# Patient Record
Sex: Male | Born: 1940 | Race: White | Hispanic: No | Marital: Married | Smoking: Former smoker
Health system: Southern US, Community
[De-identification: ages and names within clinical notes are randomized; demographics above are authoritative.]

## PROBLEM LIST (undated history)

## (undated) DIAGNOSIS — G8929 Other chronic pain: Secondary | ICD-10-CM

## (undated) DIAGNOSIS — M545 Low back pain, unspecified: Secondary | ICD-10-CM

## (undated) DIAGNOSIS — M1990 Unspecified osteoarthritis, unspecified site: Secondary | ICD-10-CM

## (undated) DIAGNOSIS — E785 Hyperlipidemia, unspecified: Secondary | ICD-10-CM

## (undated) DIAGNOSIS — IMO0001 Reserved for inherently not codable concepts without codable children: Secondary | ICD-10-CM

## (undated) DIAGNOSIS — I219 Acute myocardial infarction, unspecified: Secondary | ICD-10-CM

## (undated) DIAGNOSIS — I2510 Atherosclerotic heart disease of native coronary artery without angina pectoris: Secondary | ICD-10-CM

## (undated) DIAGNOSIS — D689 Coagulation defect, unspecified: Secondary | ICD-10-CM

## (undated) DIAGNOSIS — I739 Peripheral vascular disease, unspecified: Secondary | ICD-10-CM

## (undated) DIAGNOSIS — I10 Essential (primary) hypertension: Secondary | ICD-10-CM

## (undated) DIAGNOSIS — H269 Unspecified cataract: Secondary | ICD-10-CM

## (undated) DIAGNOSIS — D649 Anemia, unspecified: Secondary | ICD-10-CM

## (undated) HISTORY — DX: Anemia, unspecified: D64.9

## (undated) HISTORY — PX: TONSILLECTOMY: SUR1361

## (undated) HISTORY — PX: BACK SURGERY: SHX140

## (undated) HISTORY — DX: Coagulation defect, unspecified: D68.9

## (undated) HISTORY — PX: CATARACT EXTRACTION W/ INTRAOCULAR LENS  IMPLANT, BILATERAL: SHX1307

## (undated) HISTORY — DX: Atherosclerotic heart disease of native coronary artery without angina pectoris: I25.10

## (undated) HISTORY — DX: Essential (primary) hypertension: I10

## (undated) HISTORY — DX: Hyperlipidemia, unspecified: E78.5

## (undated) HISTORY — DX: Unspecified cataract: H26.9

## (undated) HISTORY — PX: CARDIAC CATHETERIZATION: SHX172

## (undated) HISTORY — PX: WRIST FUSION: SHX839

## (undated) HISTORY — PX: HAND SURGERY: SHX662

---

## 1989-06-22 HISTORY — PX: POSTERIOR LUMBAR FUSION: SHX6036

## 1989-06-22 HISTORY — PX: LUMBAR DISC SURGERY: SHX700

## 2002-09-23 ENCOUNTER — Encounter: Payer: Self-pay | Admitting: Neurosurgery

## 2002-09-25 ENCOUNTER — Inpatient Hospital Stay (HOSPITAL_COMMUNITY): Admission: RE | Admit: 2002-09-25 | Discharge: 2002-09-29 | Payer: Self-pay | Admitting: Neurosurgery

## 2002-09-25 ENCOUNTER — Encounter: Payer: Self-pay | Admitting: Neurosurgery

## 2002-09-26 ENCOUNTER — Encounter: Payer: Self-pay | Admitting: Neurosurgery

## 2003-03-19 ENCOUNTER — Ambulatory Visit (HOSPITAL_COMMUNITY): Admission: RE | Admit: 2003-03-19 | Discharge: 2003-03-19 | Payer: Self-pay | Admitting: Family Medicine

## 2005-04-20 ENCOUNTER — Encounter: Admission: RE | Admit: 2005-04-20 | Discharge: 2005-04-20 | Payer: Self-pay | Admitting: Orthopedic Surgery

## 2008-12-07 HISTORY — PX: CARDIOVASCULAR STRESS TEST: SHX262

## 2009-07-09 ENCOUNTER — Encounter: Admission: RE | Admit: 2009-07-09 | Discharge: 2009-07-09 | Payer: Self-pay | Admitting: Orthopedic Surgery

## 2009-07-19 ENCOUNTER — Encounter: Admission: RE | Admit: 2009-07-19 | Discharge: 2009-07-19 | Payer: Self-pay | Admitting: Orthopedic Surgery

## 2009-12-03 ENCOUNTER — Encounter: Admission: RE | Admit: 2009-12-03 | Discharge: 2009-12-03 | Payer: Self-pay | Admitting: Family Medicine

## 2010-10-25 ENCOUNTER — Ambulatory Visit: Payer: Self-pay | Admitting: Cardiovascular Disease

## 2011-03-09 NOTE — Discharge Summary (Signed)
   NAME:  Philip Hurst, Philip Hurst                             ACCOUNT NO.:  [id number]   MEDICAL RECORD NO.:  [id number]                   PATIENT TYPE:  INP   LOCATION:  3005                                 FACILITY:  [facility]   PHYSICIAN:  Danae Orleans. Venetia Maxon, M.D.               DATE OF BIRTH:  1940-11-27   DATE OF ADMISSION:  09/25/2002  DATE OF DISCHARGE:  09/29/2002                                 DISCHARGE SUMMARY   ADMISSION DIAGNOSES:  1. 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  2. Urinary tract infection.  3. History of illness.   HOSPITAL COURSE:  The patient is a 71 year old man with recurrent lumbar  disc herniation at L3-L4 on the left with spondylosis, degenerative disc  disease and lumbar radiculopathy.  He had been previously operated on by Dr.  Renae Fickle.  He was admitted on the same day of procedure basis and underwent L3-  L4 redo laminectomy with transverse lumbar interbody fusion at the L3-L4  level with pedicle screw fixation and posterolateral arthrodesis.  Postoperatively, he did well and was gradually mobilized.  On December 9, he  was discharged to home.   FOLLOW UP:  Follow up in three to four weeks.   DISCHARGE MEDICATIONS:  1. Percocet.  2. Valium.  3. Ciprofloxacin for urinary tract infection.   CONDITION ON DISCHARGE:  He was doing well on discharge.                                               Danae Orleans. Venetia Maxon, M.D.    JDS/MEDQ  D:  11/04/2002  T:  11/05/2002  Job:  [id number]

## 2011-03-09 NOTE — Op Note (Signed)
NAME:  Philip Hurst, Philip Hurst                             ACCOUNT NO.:  [id number]   MEDICAL RECORD NO.:  [id number]                   PATIENT TYPE:  INP   LOCATION:  3172                                 FACILITY:  [facility]   PHYSICIAN:  Danae Orleans. Venetia Maxon, M.D.               DATE OF BIRTH:  1941-08-20   DATE OF PROCEDURE:  09/25/2002  DATE OF DISCHARGE:                                 OPERATIVE REPORT   PREOPERATIVE DIAGNOSES:  Recurrent herniated lumbar disk, L3-4 left with  spondylosis, degenerative disk disease and lumbar radiculopathy.   POSTOPERATIVE DIAGNOSIS:  Recurrent herniated lumbar disk, L3-4 left with  spondylosis, degenerative disk disease and lumbar radiculopathy.   PROCEDURE:  L3-4 redo laminectomy with transverse lumbar interbody fusion (9  mm Synthes bone graft) with pedicle screw fixation L3 through L4 bilaterally  with posterior lateral arthrodesis L3-4 using morselized autograft and  Vitoss.   SURGEON:  Danae Orleans. Venetia Maxon, M.D.   ASSISTANT:  Cristi Loron, M.D.   ANESTHESIA:  General endotracheal anesthesia.   ESTIMATED BLOOD LOSS:  400 cc.   COMPLICATIONS:  None.   DISPOSITION:  To recovery.   INDICATIONS FOR PROCEDURE:  The patient is a 70 year old man who had  previously undergone lumbar diskectomy many years ago.  He has had a long  history of severe left leg pain and weakness.  He was found to have  spondylosis and scoliosis with recurrent disk herniation causing  extraforaminal and foraminal compression of the L3 nerve root.  It was  elected to take him to surgery for decompression and fusion.   PROCEDURE IN DETAIL:  The patient was brought to the operating room.  Following satisfactory level of uncomplicated induction of general  endotracheal anesthesia, placement of intravenous lines and Foley catheter,  he was turned in the prone position on chest rolls.  His low back was then  prepped and draped in the usual sterile fashion.  The plane for incision  was  infiltrated with 0.25% Marcaine and 1% lidocaine, 1:200,000 epinephrine.  Incision was made in the midline over the previous lumbar incision.  It was  carried above and below  to facilitate exposure.  The subperiosteal  dissection was then performed exposing the L3 through L4 transverse  processes bilaterally.  Intraoperative x-ray confirmed our marker at the L3-  4 interspace.  The  Versatrac retractor was used to facilitate exposure.  There was a considerable amount of scar tissue at the L3-4 interspace.  This  was taken down with a guided curet and then pars interarticularis was  drilled out and the remaining facet and laminar bone were removed using a  variety of Kerrison's and Leksell rongeurs.  There was a marked amount of  scar tissue compressing the lateral aspect of the fecal sac and also the L3  nerve.  These were decompressed carefully under loop magnification.  A  diskectomy was  performed at this level with resultant decompression of both  the L3 nerve and the lateral sac but it was not possible to fully mobilize  the lateral sac on the left because of the considerable amount of scar  tissue.  It was, therefore, elected to perform laminectomy on the right as  well, and this was done by drilling the pars interarticularis and the lamina  and removing the inferior facet of L3 in one piece.  Subsequently, lateral  recess and foraminal region were decompressed.  The L4 and L3 nerve roots  were decompressed widely.  Subsequently, a diskectomy was performed on the  right side after protecting the chondral tube with the D'Errico nerve root  retractor.  Significant osteophytes were removed with the oscillating tool.  After diskectomy had been performed bilaterally and the osteophytes had been  removed, it was elected to place pedicle screw fixation and this was done at  both the L3 and L4 levels bilaterally -  6.75 x 40 mm screws were placed,  two at L3 and two at L4.  All screws  had excellent purchase. Their position  was confirmed under lateral and AP fluoroscopy.  Subsequently, the  interspace was distracted from the left side initially with a rod, but then  subsequently with an interspace distractor and the endplates of L3 and L4  were stripped of residual cartilaginous material on the right and residual  diskectomy was performed.  Using the Synthes TLIF instruments, the endplates  were stripped of residual disk material, and subsequently 9 mm Dynamic size  was found to fit appropriately in the interspace and a 9 mm bone graft was  inserted and counter sunk appropriately.  Position was confirmed under  fluoroscopy and subsequently morselized bone autograft was then inserted in  the interspace and count sunk appropriately.  The transverse processes of L3  and L4 were then stripped of periosteum and morselized bone autograft and  mixed with Vitoss was then laid over the transverse processes from L3 to L4  bilaterally.  40 mm rods were then inserted in the SD-90 pedicles screws and  locking caps were engaged.  The wound had been copiously irrigated with  prior placement bone graft and subsequently the self-retaining retractor was  removed.  The nerve roots were inspected and found to be in good repair as  was the thecal sac. The lumbodorsal fascia was then 0 Vicryl sutures.  Subcutaneous tissues were reapproximated with 2-0 Vicryl interrupted  inverted sutures, and skin edges were reapproximated 3-0 Vicryl subcuticular  stitch.  The wound was dressed with Benzoin and Steri-Strips, Telfa gauze  and tape.  The patient was extubated in the operating room and taken to the  recovery room in stable and satisfactory condition, having  tolerated  the  operative procedure well.  All counts were correct at the end of the case.                                               Danae Orleans. Venetia Maxon, M.D.    JDS/MEDQ  D:  09/25/2002  T:  09/26/2002  Job:  [id number]

## 2011-08-27 ENCOUNTER — Ambulatory Visit: Payer: Self-pay | Admitting: Cardiovascular Disease

## 2011-08-27 ENCOUNTER — Encounter: Payer: Self-pay | Admitting: Cardiovascular Disease

## 2011-08-28 ENCOUNTER — Other Ambulatory Visit: Payer: Self-pay | Admitting: *Deleted

## 2011-08-28 MED ORDER — CARVEDILOL 6.25 MG PO TABS: 6.2500 mg | ORAL_TABLET | Freq: Two times a day (BID) | ORAL | Status: DC

## 2011-09-12 ENCOUNTER — Ambulatory Visit (INDEPENDENT_AMBULATORY_CARE_PROVIDER_SITE_OTHER): Payer: Medicare Other | Admitting: Cardiovascular Disease

## 2011-09-12 ENCOUNTER — Encounter: Payer: Self-pay | Admitting: Cardiovascular Disease

## 2011-09-12 VITALS — BP 169/91 | HR 70 | Ht 64.0 in | Wt 179.1 lb

## 2011-09-12 DIAGNOSIS — I10 Essential (primary) hypertension: Secondary | ICD-10-CM

## 2011-09-12 DIAGNOSIS — I2510 Atherosclerotic heart disease of native coronary artery without angina pectoris: Secondary | ICD-10-CM | POA: Insufficient documentation

## 2011-09-12 DIAGNOSIS — E785 Hyperlipidemia, unspecified: Secondary | ICD-10-CM

## 2011-09-12 HISTORY — DX: Atherosclerotic heart disease of native coronary artery without angina pectoris: I25.10

## 2011-09-12 NOTE — Patient Instructions (Signed)
Your physician wants you to follow-up in: 6 mo You will receive a reminder letter in the mail two months in advance. If you don't receive a letter, please call our office to schedule the follow-up appointment.  Your physician recommends that you return for a FASTING lipid profile: 6months

## 2011-09-12 NOTE — Progress Notes (Signed)
Patient ID: Philip Hurst, male   DOB: [birth year], 70 y.o.   MRN: [id number]     Philip Hurst Date of Birth  1941-08-02 [facility] [address]    [address] [place], [place]  [address] [phone]  Fax  [phone]  History of Present Illness:  Philip Hurst is a 70 year old gentleman with a history of coronary artery disease. He has an occluded right coronary artery. He also has a history of hypertension and hyperlipidemia.  He's had a cold recently and states it is been eating some chicken noodle soup. He does state that he's been getting a little it of extra salt because of his illness.  He's been able to do all of his normal activities without any problems.  Current Outpatient Prescriptions on File Prior to Visit  Medication Sig Dispense Refill  . aspirin 81 MG tablet Take 81 mg by mouth daily.        . carvedilol (COREG) 6.25 MG tablet Take 1 tablet (6.25 mg total) by mouth 2 (two) times daily with a meal.  60 tablet  3  . fish oil-omega-3 fatty acids 1000 MG capsule Take 2 g by mouth daily.        Marland Kitchen gemfibrozil (LOPID) 600 MG tablet Take 600 mg by mouth 2 (two) times daily before a meal.        . lisinopril (PRINIVIL,ZESTRIL) 20 MG tablet Take 20 mg by mouth daily.        Marland Kitchen lovastatin (MEVACOR) 40 MG tablet Take 40 mg by mouth at bedtime.          No Known Allergies  Past Medical History  Diagnosis Date  . Hypertension   . Hyperlipidemia   . Coronary artery disease     OCCLUDED RCA    Past Surgical History  Procedure Date  . Cardiovascular stress test 12/07/2008    EF 58%, NO ISCHEMIA    History  Smoking status  . Former Smoker  . Quit date: 08/26/1997  Smokeless tobacco  . Not on file    History  Alcohol Use No    Family History  Problem Relation Age of Onset  . Seizures Mother   . Cancer Father   . Coronary artery disease Brother   . Diabetes Brother     Reviw of Systems:  Reviewed in the HPI.  All other systems are negative.  Physical  Exam: BP 169/91  Pulse 70  Ht 5\' 4"  (1.626 m)  Wt 179 lb 1.9 oz (81.248 kg)  BMI 30.75 kg/m2 The patient is alert and oriented x 3.  The mood and affect are normal.   Skin: warm and dry.  Color is normal.    HEENT:   [name], AT, normal carotids, no JVD, neck is supple  Lungs: clear   Heart: RR, no murmurs    Abdomen: soft, +BS  Extremities:  No c/c/e  Neuro:  Gait is normal    ECG: EKG reveals normal sinus rhythm. He has a first degree AV block.  Assessment / Plan:

## 2011-09-12 NOTE — Assessment & Plan Note (Signed)
His blood pressure is a little high today. I think that he's been eating some extra salt because of his viral illness. He'll get back to his normal less than. I've asked him to check his blood pressure on a regular basis. I'll see him again in 3 months.

## 2011-09-12 NOTE — Assessment & Plan Note (Signed)
We'll check a fasting lipid profile and hepatic profile and basic profile at his next office visit.

## 2011-09-12 NOTE — Assessment & Plan Note (Signed)
This is stable. He's not having episodes of angina.

## 2012-03-21 ENCOUNTER — Other Ambulatory Visit: Payer: Self-pay | Admitting: *Deleted

## 2012-03-21 MED ORDER — CARVEDILOL 6.25 MG PO TABS: 6.2500 mg | ORAL_TABLET | Freq: Two times a day (BID) | ORAL | Status: DC

## 2012-03-21 NOTE — Telephone Encounter (Signed)
Fax Received. Refill Completed. Anahit Klumb Chowoe (R.M.A)   

## 2012-08-26 ENCOUNTER — Telehealth: Payer: Self-pay | Admitting: Oncology

## 2012-08-26 NOTE — Telephone Encounter (Signed)
C/D 08/26/12 for appt. 09/05/12

## 2012-08-29 ENCOUNTER — Other Ambulatory Visit: Payer: Self-pay | Admitting: Oncology

## 2012-08-29 DIAGNOSIS — D649 Anemia, unspecified: Secondary | ICD-10-CM

## 2012-09-05 ENCOUNTER — Ambulatory Visit (HOSPITAL_BASED_OUTPATIENT_CLINIC_OR_DEPARTMENT_OTHER): Payer: Medicare Other | Admitting: Oncology

## 2012-09-05 ENCOUNTER — Ambulatory Visit: Payer: Medicare Other

## 2012-09-05 ENCOUNTER — Telehealth: Payer: Self-pay | Admitting: Oncology

## 2012-09-05 ENCOUNTER — Encounter: Payer: Self-pay | Admitting: Oncology

## 2012-09-05 ENCOUNTER — Other Ambulatory Visit (HOSPITAL_BASED_OUTPATIENT_CLINIC_OR_DEPARTMENT_OTHER): Payer: Medicare Other | Admitting: Lab

## 2012-09-05 VITALS — BP 120/69 | HR 79 | Temp 97.7°F | Resp 20 | Ht 64.0 in | Wt 187.1 lb

## 2012-09-05 DIAGNOSIS — D539 Nutritional anemia, unspecified: Secondary | ICD-10-CM

## 2012-09-05 DIAGNOSIS — D649 Anemia, unspecified: Secondary | ICD-10-CM

## 2012-09-05 LAB — CBC WITH DIFFERENTIAL/PLATELET
Basophils Absolute: 0 10*3/uL (ref 0.0–0.1)
EOS%: 2.5 % (ref 0.0–7.0)
HCT: 29.8 % — ABNORMAL LOW (ref 38.4–49.9)
HGB: 10.4 g/dL — ABNORMAL LOW (ref 13.0–17.1)
LYMPH%: 23.7 % (ref 14.0–49.0)
MCH: 32 pg (ref 27.2–33.4)
MCV: 91.5 fL (ref 79.3–98.0)
MONO%: 9.2 % (ref 0.0–14.0)
NEUT%: 64.1 % (ref 39.0–75.0)
Platelets: 277 10*3/uL (ref 140–400)
lymph#: 2.2 10*3/uL (ref 0.9–3.3)

## 2012-09-05 LAB — COMPREHENSIVE METABOLIC PANEL (CC13)
AST: 20 U/L (ref 5–34)
BUN: 36 mg/dL — ABNORMAL HIGH (ref 7.0–26.0)
Calcium: 9.9 mg/dL (ref 8.4–10.4)
Chloride: 105 mEq/L (ref 98–107)
Creatinine: 1.4 mg/dL — ABNORMAL HIGH (ref 0.7–1.3)
Total Bilirubin: 0.26 mg/dL (ref 0.20–1.20)

## 2012-09-05 NOTE — Telephone Encounter (Signed)
Gave pt appt for 11/05/12.

## 2012-09-05 NOTE — Progress Notes (Signed)
Note dictated

## 2012-09-05 NOTE — Progress Notes (Signed)
Checked in new patient. No financial issues. °

## 2012-09-05 NOTE — Progress Notes (Signed)
CC:   Windle Guard, M.D.  REASON FOR CONSULTATION:  Anemia.  HISTORY OF PRESENT ILLNESS:  This is a 71 year old gentleman originally from [place], [place], currently of [place] since 1980.  This is a gentleman who is relatively healthy.  He is retired, worked in [employer] and also served in the [employer] during the [place] war.  This is a gentleman with history of hypertension and hyperlipidemia and gets his routine care as mentioned with Dr. Windle Guard.  He was diagnosed with anemia about 2 years ago and at that time he was treated with iron supplementation and his anemia resolved at that time.  However, most recently his hemoglobin drifted back again to 11.0 on 05/30/2012.  Subsequent to that on 09/18 was up to 12.2 and now drifted back again to 11.2.  His iron studies showed an iron level of 99, saturation of 21 percent.  He is relatively asymptomatic.  He does report some mild fatigue but had not reported a weight loss, had not had any major appetite changes.  He has not reported any major decline in his performance status.  He does report some hip pain, which hindered his regular exercise of walking but that is arthritic and has been in nature.  Otherwise he does not report any constitutional symptoms.  He has not reported any major changes in his appetite or performance status.  He had not had any weight loss or decline in his energy.  REVIEW OF SYSTEMS:  He does not report any headaches, blurry vision, double vision.  Does not report any motor or sensory neuropathy.  Does not report any alteration in mental status.  Does not reporting any psychiatric issues or depression.  Does not report any fever, chills, sweats.  Does not report any cough, hemoptysis, hematemesis.  No nausea, vomiting, abdominal pain, hematochezia, melena, genitourinary complaints.  The rest of the review of systems unremarkable.  PAST MEDICAL HISTORY:  Significant for hypertension, history  of hyperlipidemia, history of osteoarthritis.  No diabetes or coronary artery disease.  ALLERGIES:  None.  MEDICATIONS:  He is on aspirin, carvedilol, fish oil, gemfibrozil, lisinopril, lovastatin, oxycodone and testosterone supplements.  SOCIAL HISTORY:  He is married.  He has 4 children.  Quit smoking about 20 years ago, after he had smoked for about 30 years.  Denied any alcohol abuse.  FAMILY HISTORY:  His father died of alcoholic cirrhosis.  Unsure about his mother's health but deceased possibly from a seizure.  He had a brother and a sister who died of lung cancer.  PHYSICAL EXAMINATION:  General:  Alert, awake gentleman, appeared in no active distress today.  Vital signs:  His vitals showed a blood pressure 120/69, pulse 79, respiration 20, weighs 187 pounds.  HEENT:  Head is normocephalic, atraumatic.  Pupils equal, round, reactive to light. Oral mucosa moist and pink.  Neck:  Supple without adenopathy.  Heart: Regular rate and rhythm.  S1, S2.  Lungs:  Clear to auscultation without wheezes, dullness to percussion.  Abdomen:  Soft, nontender.  No hepatosplenomegaly.  Extremities:  No clubbing, cyanosis or edema. Neurological:  Intact motor, sensory and deep tendon reflexes.  LABORATORY DATA:  Today showed a hemoglobin of 10.4, white cell count of 9.2, platelet count of 277.  His MCV is 91.5.  Peripheral smear was personally reviewed today and I could not really appreciate any schistocytosis or red cell fragmentation.  I could not really appreciate any dysplasia, could not appreciate any white cell abnormalities. Platelets appeared normal.  The red cells appeared normal.  There was some population of spherocytes noted.  ASSESSMENT AND PLAN:  A 71 year old gentleman with normocytic normochromic anemia that has fluctuated for the last 2 years, more specifically in the last couple of years at this time.  The differential diagnoses discussed with Mr. Goldring today, these would  include vitamin deficiency such as vitamin B12, folic acid, possibly iron deficiency which I think is less likely, could be anemia of chronic disease due to his multiple medications, could be due to his chronic medical issues. This could also be a plasma cell disorder, myelodysplastic syndrome are also distinct possibility.  It could also be a mildly compensated hemolysis although I think it is less likely.  His electrolytes including liver function tests and bilirubin have been normal but those have been repeated.  For the time being he is asymptomatic, it is very mild at this time.  I will suggest a close period of observation until his workup is complete and I have discussed with him the possibility of a bone marrow biopsy if we cannot determine the etiology of his anemia. I also talked to him about possibly using growth factor support if needed to down the line.  All his questions were answered today.    ______________________________ Benjiman Core, M.D. FNS/MEDQ  D:  09/05/2012  T:  09/05/2012  Job:  [id number]

## 2012-09-12 LAB — SPEP & IFE WITH QIG
Alpha-1-Globulin: 4.9 % (ref 2.9–4.9)
Alpha-2-Globulin: 16.5 % — ABNORMAL HIGH (ref 7.1–11.8)
Gamma Globulin: 11.2 % (ref 11.1–18.8)
Immunofix Electr Int: 0
Total Protein, Serum Electrophoresis: 7.3 g/dL (ref 6.0–8.3)

## 2012-09-12 LAB — VITAMIN B12 DEFICIENCY PANEL - CHCC

## 2012-09-12 LAB — ERYTHROPOIETIN: Erythropoietin: 24.8 m[IU]/mL (ref 2.6–34.0)

## 2012-09-12 LAB — IRON AND TIBC
Iron: 95 ug/dL (ref 42–165)
TIBC: 341 ug/dL (ref 215–435)
UIBC: 246 ug/dL (ref 125–400)

## 2012-11-05 ENCOUNTER — Other Ambulatory Visit (HOSPITAL_BASED_OUTPATIENT_CLINIC_OR_DEPARTMENT_OTHER): Payer: Medicare Other | Admitting: Lab

## 2012-11-05 ENCOUNTER — Telehealth: Payer: Self-pay | Admitting: Oncology

## 2012-11-05 ENCOUNTER — Ambulatory Visit (HOSPITAL_BASED_OUTPATIENT_CLINIC_OR_DEPARTMENT_OTHER): Payer: Medicare Other | Admitting: Oncology

## 2012-11-05 VITALS — BP 138/84 | HR 90 | Temp 97.3°F | Resp 20 | Ht 64.0 in | Wt 193.7 lb

## 2012-11-05 DIAGNOSIS — D649 Anemia, unspecified: Secondary | ICD-10-CM

## 2012-11-05 DIAGNOSIS — D539 Nutritional anemia, unspecified: Secondary | ICD-10-CM

## 2012-11-05 LAB — CBC WITH DIFFERENTIAL/PLATELET
Eosinophils Absolute: 0.3 10*3/uL (ref 0.0–0.5)
MONO#: 0.7 10*3/uL (ref 0.1–0.9)
NEUT#: 4.9 10*3/uL (ref 1.5–6.5)
RBC: 3.77 10*6/uL — ABNORMAL LOW (ref 4.20–5.82)
RDW: 13.4 % (ref 11.0–14.6)
WBC: 8.3 10*3/uL (ref 4.0–10.3)

## 2012-11-05 LAB — COMPREHENSIVE METABOLIC PANEL (CC13)
Albumin: 4.2 g/dL (ref 3.5–5.0)
Alkaline Phosphatase: 82 U/L (ref 40–150)
CO2: 26 mEq/L (ref 22–29)
Glucose: 95 mg/dl (ref 70–99)
Potassium: 4.6 mEq/L (ref 3.5–5.1)
Sodium: 139 mEq/L (ref 136–145)
Total Protein: 7.8 g/dL (ref 6.4–8.3)

## 2012-11-05 NOTE — Telephone Encounter (Signed)
Gave pt appt for lab and MD on July 2014 °

## 2012-11-05 NOTE — Progress Notes (Signed)
Hematology and Oncology Follow Up Visit  Philip Hurst [id number] 05/29/1941 72 y.o. 11/05/2012 3:29 PM   Principle Diagnosis: 72 year old with mild anemia. Likely multifactorial due chronic disease or renal disease diagnosed in 08/2012    Current therapy: Observation only.   Interim History:  [name] presents today for a follow up visit, he a very nice man returns today for a check up. He has been doing well since the last visit. He does report some hip pain, which hindered his regular exercise of walking but that is arthritic and has been in nature. Otherwise he does not report any constitutional symptoms. He  has not reported any major changes in his appetite or performance status. He had not had any weight loss or decline in his energy.   Medications: I have reviewed the patient's current medications. Current outpatient prescriptions:aspirin 81 MG tablet, Take 81 mg by mouth daily.  , Disp: , Rfl: ;  fish oil-omega-3 fatty acids 1000 MG capsule, Take 2 g by mouth daily.  , Disp: , Rfl: ;  hydrochlorothiazide (HYDRODIURIL) 25 MG tablet, Take 25 mg by mouth daily., Disp: , Rfl: ;  HYDROcodone-acetaminophen (NORCO) 10-325 MG per tablet, Take 1 tablet by mouth every 6 (six) hours as needed., Disp: , Rfl:  losartan (COZAAR) 100 MG tablet, Take 100 mg by mouth daily., Disp: , Rfl: ;  lovastatin (MEVACOR) 40 MG tablet, Take 40 mg by mouth at bedtime.  , Disp: , Rfl: ;  testosterone cypionate (DEPOTESTOTERONE CYPIONATE) 200 MG/ML injection, as needed., Disp: , Rfl:   Allergies: No Known Allergies  Past Medical History, Surgical history, Social history, and Family History were reviewed and updated.  Review of Systems: Constitutional:  Negative for fever, chills, night sweats, anorexia, weight loss, pain. Cardiovascular: no chest pain or dyspnea on exertion Respiratory: negative Neurological: negative Dermatological: negative ENT: negative Skin: Negative. Gastrointestinal:  negative Genito-Urinary: negative Hematological and Lymphatic: negative Breast: negative Musculoskeletal: negative Remaining ROS negative. Physical Exam: Blood pressure 138/84, pulse 90, temperature 97.3 F (36.3 C), temperature source Oral, resp. rate 20, height 5\' 4"  (1.626 m), weight 193 lb 11.2 oz (87.862 kg). ECOG: 0 General appearance: alert Head: Normocephalic, without obvious abnormality, atraumatic Neck: no adenopathy, no carotid bruit, no JVD, supple, symmetrical, trachea midline and thyroid not enlarged, symmetric, no tenderness/mass/nodules Lymph nodes: Cervical, supraclavicular, and axillary nodes normal. Heart:regular rate and rhythm, S1, S2 normal, no murmur, click, rub or gallop Lung:chest clear, no wheezing, rales, normal symmetric air entry Abdomin: soft, non-tender, without masses or organomegaly EXT:no erythema, induration, or nodules   Lab Results: Lab Results  Component Value Date   WBC 8.3 11/05/2012   HGB 11.9* 11/05/2012   HCT 34.6* 11/05/2012   MCV 91.8 11/05/2012   PLT 239 11/05/2012     Chemistry      Component Value Date/Time   NA 138 09/05/2012 1351   K 5.3* 09/05/2012 1351   CL 105 09/05/2012 1351   CO2 24 09/05/2012 1351   BUN 36.0* 09/05/2012 1351   CREATININE 1.4* 09/05/2012 1351      Component Value Date/Time   CALCIUM 9.9 09/05/2012 1351   ALKPHOS 85 09/05/2012 1351   AST 20 09/05/2012 1351   ALT 21 09/05/2012 1351   BILITOT 0.26 09/05/2012 1351       Impression and Plan:  71 year old gentleman with normocytic normochromic anemia that has fluctuated for the last 2 years. It appears to be multifactorial in nature.  His work up did not  revel any plasma cell disorder.  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  His counts today appear better with Hgb up to 11.9.  For now, we will continue observation and repeat counts in 6 months. If any further drop noted, we will consider a bone marrow  biopsy.   [facility], MD 1/15/20143:29 PM

## 2012-11-11 ENCOUNTER — Other Ambulatory Visit: Payer: Self-pay | Admitting: Family Medicine

## 2012-11-11 DIAGNOSIS — R252 Cramp and spasm: Secondary | ICD-10-CM

## 2012-11-13 ENCOUNTER — Other Ambulatory Visit: Payer: Medicare Other

## 2012-11-13 ENCOUNTER — Ambulatory Visit
Admission: RE | Admit: 2012-11-13 | Discharge: 2012-11-13 | Disposition: A | Discharge status: Home / Self Care | Payer: Medicare Other | Source: Ambulatory Visit | Attending: Family Medicine | Admitting: Family Medicine

## 2012-11-13 ENCOUNTER — Other Ambulatory Visit: Payer: Self-pay | Admitting: Family Medicine

## 2012-11-13 DIAGNOSIS — R252 Cramp and spasm: Secondary | ICD-10-CM

## 2012-11-19 ENCOUNTER — Other Ambulatory Visit (HOSPITAL_COMMUNITY): Payer: Self-pay | Admitting: Cardiovascular Disease

## 2012-11-19 DIAGNOSIS — Z01818 Encounter for other preprocedural examination: Secondary | ICD-10-CM

## 2012-11-25 ENCOUNTER — Ambulatory Visit (HOSPITAL_COMMUNITY)
Admission: RE | Admit: 2012-11-25 | Discharge: 2012-11-25 | Disposition: A | Discharge status: Home / Self Care | Payer: Medicare Other | Source: Ambulatory Visit | Attending: Cardiovascular Disease | Admitting: Cardiovascular Disease

## 2012-11-25 DIAGNOSIS — Z01818 Encounter for other preprocedural examination: Secondary | ICD-10-CM

## 2012-11-25 DIAGNOSIS — I2510 Atherosclerotic heart disease of native coronary artery without angina pectoris: Secondary | ICD-10-CM | POA: Insufficient documentation

## 2012-11-25 DIAGNOSIS — I10 Essential (primary) hypertension: Secondary | ICD-10-CM | POA: Insufficient documentation

## 2012-11-25 DIAGNOSIS — I739 Peripheral vascular disease, unspecified: Secondary | ICD-10-CM | POA: Insufficient documentation

## 2012-11-25 MED ORDER — REGADENOSON 0.4 MG/5ML IV SOLN
0.4000 mg | Freq: Once | INTRAVENOUS | Status: AC
  Administered 2012-11-25: 0.4 mg via INTRAVENOUS

## 2012-11-25 MED ORDER — TECHNETIUM TC 99M SESTAMIBI GENERIC - CARDIOLITE
31.0000 | Freq: Once | INTRAVENOUS | Status: AC | PRN
  Administered 2012-11-25: 31 via INTRAVENOUS

## 2012-11-25 MED ORDER — TECHNETIUM TC 99M SESTAMIBI GENERIC - CARDIOLITE
11.0000 | Freq: Once | INTRAVENOUS | Status: AC | PRN
  Administered 2012-11-25: 11 via INTRAVENOUS

## 2012-11-25 NOTE — Procedures (Addendum)
[facility] [address] [place] [address] [phone]  Cardiology Nuclear Med Study  KNOWLEDGE ESCANDON is a 72 y.o. male     MRN : [id number]     DOB: 1941-04-18  Procedure Date: 11/25/2012  Nuclear Med Background Indication for Stress Test:  Surgical Clearance History:  CAD Cardiac Risk Factors: History of Smoking, Hypertension, Lipids, Overweight and PVD  Symptoms:  none   Nuclear Pre-Procedure Caffeine/Decaff Intake:  7:00pm NPO After: 5:00am   IV Site: R Hand  IV 0.9% NS with Angio Cath:  22g  Chest Size (in):  42 IV Started by: Koren Shiver, CNMT  Height: 5\' 4"  (1.626 m)  Cup Size: n/a  BMI:  Body mass index is 33.30 kg/(m^2). Weight:  194 lb (87.998 kg)   Tech Comments:  n/a    Nuclear Med Study 1 or 2 day study: 1 day  Stress Test Type:  Lexiscan  Order Authorizing Provider:  Nanetta Batty, MD   Resting Radionuclide: Technetium 39m Sestamibi  Resting Radionuclide Dose: 11.0 mCi   Stress Radionuclide:  Technetium 2m Sestamibi  Stress Radionuclide Dose: 31.0 mCi           Stress Protocol Rest HR: 88 Stress HR: 102  Rest BP: 178/86 Stress BP: 154/80  Exercise Time (min): n/a METS: n/a          Dose of Adenosine (mg):  n/a Dose of Lexiscan: 0.4 mg  Dose of Atropine (mg): n/a Dose of Dobutamine: n/a mcg/kg/min (at max HR)  Stress Test Technologist: Ernestene Mention, CCT Nuclear Technologist: Gonzella Lex, CNMT   Rest Procedure:  Myocardial perfusion imaging was performed at rest 45 minutes following the intravenous administration of Technetium 49m Sestamibi. Stress Procedure:  The patient received IV Lexiscan 0.4 mg over 15-seconds.  Technetium 43m Sestamibi injected at 30-seconds.  There were no significant changes with Lexiscan.  Quantitative spect images were obtained after a 45 minute delay.  Transient Ischemic Dilatation (Normal <1.22):  0.80  Lung/Heart Ratio (Normal <0.45):  0.38 QGS EDV:  65  ml QGS ESV:  22 ml LV Ejection Fraction: 66%  Signed by    Rest ECG: NSR with 1st degree AVB  Stress ECG: There are scattered PVCs.  QPS Raw Data Images:  Normal; no motion artifact; normal heart/lung ratio. Stress Images:  Small fixed basal inferior bowel artifact. Rest Images:  Small fixed basal inferior bowel artifact. Subtraction (SDS):  No evidence of ischemia.  Impression Exercise Capacity:  Lexiscan with no exercise. BP Response:  Normal blood pressure response. Clinical Symptoms:  No significant symptoms noted. ECG Impression:  There are scattered PVCs. Comparison with Prior Nuclear Study: No previous nuclear study performed  Overall Impression:  Low risk stress nuclear study. Small basal inferior bowel attenuation artifact. No ischemia.  LV Wall Motion:  NL LV Function; NL Wall Motion; EF 66%. TID 0.8 (normal)  Chrystie Nose, MD, [facility] Attending Cardiologist [facility]  Chrystie Nose, MD  11/25/2012 11:17 AM

## 2013-01-06 ENCOUNTER — Other Ambulatory Visit (HOSPITAL_COMMUNITY): Payer: Self-pay | Admitting: Cardiovascular Disease

## 2013-01-06 DIAGNOSIS — I70219 Atherosclerosis of native arteries of extremities with intermittent claudication, unspecified extremity: Secondary | ICD-10-CM

## 2013-01-19 ENCOUNTER — Ambulatory Visit (HOSPITAL_COMMUNITY)
Admission: RE | Admit: 2013-01-19 | Discharge: 2013-01-19 | Disposition: A | Discharge status: Home / Self Care | Payer: Medicare Other | Source: Ambulatory Visit | Attending: Cardiovascular Disease | Admitting: Cardiovascular Disease

## 2013-01-19 DIAGNOSIS — I70219 Atherosclerosis of native arteries of extremities with intermittent claudication, unspecified extremity: Secondary | ICD-10-CM

## 2013-01-19 NOTE — Progress Notes (Signed)
ABIs and lower extremity duplex doppler was completed. Kayleen Alig RVT 

## 2013-01-23 ENCOUNTER — Other Ambulatory Visit: Payer: Self-pay | Admitting: *Deleted

## 2013-01-23 DIAGNOSIS — Z01810 Encounter for preprocedural cardiovascular examination: Secondary | ICD-10-CM

## 2013-01-26 ENCOUNTER — Encounter (HOSPITAL_COMMUNITY): Payer: Self-pay | Admitting: Pharmacy Technician

## 2013-01-27 ENCOUNTER — Ambulatory Visit
Admission: RE | Admit: 2013-01-27 | Discharge: 2013-01-27 | Disposition: A | Discharge status: Home / Self Care | Payer: Medicare Other | Source: Ambulatory Visit | Attending: Cardiovascular Disease | Admitting: Cardiovascular Disease

## 2013-01-27 ENCOUNTER — Other Ambulatory Visit: Payer: Self-pay | Admitting: Cardiovascular Disease

## 2013-01-27 DIAGNOSIS — Z01811 Encounter for preprocedural respiratory examination: Secondary | ICD-10-CM

## 2013-01-27 DIAGNOSIS — Z87891 Personal history of nicotine dependence: Secondary | ICD-10-CM

## 2013-01-30 ENCOUNTER — Ambulatory Visit (HOSPITAL_COMMUNITY)
Admission: RE | Admit: 2013-01-30 | Discharge: 2013-01-31 | Disposition: A | Discharge status: Home / Self Care | Payer: Medicare Other | Source: Ambulatory Visit | Attending: Cardiovascular Disease | Admitting: Cardiovascular Disease

## 2013-01-30 ENCOUNTER — Other Ambulatory Visit: Payer: Self-pay | Admitting: Cardiology

## 2013-01-30 ENCOUNTER — Encounter (HOSPITAL_COMMUNITY): Payer: Self-pay | Admitting: Cardiology

## 2013-01-30 ENCOUNTER — Encounter (HOSPITAL_COMMUNITY)
Admission: RE | Disposition: A | Discharge status: Home / Self Care | Payer: Self-pay | Source: Ambulatory Visit | Attending: Cardiovascular Disease

## 2013-01-30 DIAGNOSIS — I2510 Atherosclerotic heart disease of native coronary artery without angina pectoris: Secondary | ICD-10-CM | POA: Diagnosis present

## 2013-01-30 DIAGNOSIS — Z01810 Encounter for preprocedural cardiovascular examination: Secondary | ICD-10-CM

## 2013-01-30 DIAGNOSIS — I743 Embolism and thrombosis of arteries of the lower extremities: Secondary | ICD-10-CM | POA: Insufficient documentation

## 2013-01-30 DIAGNOSIS — IMO0001 Reserved for inherently not codable concepts without codable children: Secondary | ICD-10-CM

## 2013-01-30 DIAGNOSIS — I739 Peripheral vascular disease, unspecified: Secondary | ICD-10-CM | POA: Insufficient documentation

## 2013-01-30 DIAGNOSIS — I10 Essential (primary) hypertension: Secondary | ICD-10-CM | POA: Insufficient documentation

## 2013-01-30 DIAGNOSIS — E785 Hyperlipidemia, unspecified: Secondary | ICD-10-CM | POA: Insufficient documentation

## 2013-01-30 HISTORY — DX: Reserved for inherently not codable concepts without codable children: IMO0001

## 2013-01-30 HISTORY — DX: Atherosclerotic heart disease of native coronary artery without angina pectoris: I25.10

## 2013-01-30 HISTORY — PX: POPLITEAL ARTERY STENT: SHX2243

## 2013-01-30 HISTORY — PX: PERCUTANEOUS STENT INTERVENTION: SHX5500

## 2013-01-30 HISTORY — DX: Peripheral vascular disease, unspecified: I73.9

## 2013-01-30 LAB — POCT ACTIVATED CLOTTING TIME
Activated Clotting Time: 225 seconds
Activated Clotting Time: 219 seconds

## 2013-01-30 LAB — GLUCOSE, CAPILLARY

## 2013-01-30 SURGERY — PERCUTANEOUS STENT INTERVENTION

## 2013-01-30 MED ORDER — ASPIRIN 81 MG PO TABS: 81.0000 mg | ORAL_TABLET | Freq: Every day | ORAL | Status: DC

## 2013-01-30 MED ORDER — HYDROCODONE-ACETAMINOPHEN 10-325 MG PO TABS
1.0000 | ORAL_TABLET | ORAL | Status: DC | PRN
  Administered 2013-01-30 – 2013-01-31 (×4): 1 via ORAL
  Filled 2013-01-30 (×4): qty 1

## 2013-01-30 MED ORDER — ASPIRIN 81 MG PO CHEW
CHEWABLE_TABLET | ORAL | Status: AC
  Filled 2013-01-30: qty 3

## 2013-01-30 MED ORDER — CLOPIDOGREL BISULFATE 75 MG PO TABS
75.0000 mg | ORAL_TABLET | Freq: Every day | ORAL | Status: DC
  Administered 2013-01-31: 75 mg via ORAL
  Filled 2013-01-30: qty 1

## 2013-01-30 MED ORDER — SODIUM CHLORIDE 0.9 % IJ SOLN: 3.0000 mL | INTRAMUSCULAR | Status: DC | PRN

## 2013-01-30 MED ORDER — SIMVASTATIN 20 MG PO TABS
20.0000 mg | ORAL_TABLET | Freq: Every day | ORAL | Status: DC
  Administered 2013-01-30: 17:00:00 20 mg via ORAL
  Filled 2013-01-30 (×2): qty 1

## 2013-01-30 MED ORDER — MIDAZOLAM HCL 2 MG/2ML IJ SOLN
INTRAMUSCULAR | Status: AC
  Filled 2013-01-30: qty 2

## 2013-01-30 MED ORDER — SODIUM CHLORIDE 0.9 % IV SOLN
INTRAVENOUS | Status: AC
  Administered 2013-01-30: 16:00:00 via INTRAVENOUS

## 2013-01-30 MED ORDER — CLOPIDOGREL BISULFATE 300 MG PO TABS
300.0000 mg | ORAL_TABLET | Freq: Once | ORAL | Status: AC
  Administered 2013-01-30: 300 mg via ORAL

## 2013-01-30 MED ORDER — PNEUMOCOCCAL VAC POLYVALENT 25 MCG/0.5ML IJ INJ
0.5000 mL | INJECTION | INTRAMUSCULAR | Status: DC
  Filled 2013-01-30 (×2): qty 0.5

## 2013-01-30 MED ORDER — DIAZEPAM 5 MG PO TABS: 5.0000 mg | ORAL_TABLET | ORAL | Status: AC

## 2013-01-30 MED ORDER — MORPHINE SULFATE 4 MG/ML IJ SOLN
2.0000 mg | Freq: Once | INTRAMUSCULAR | Status: AC
  Administered 2013-01-30: 2 mg via INTRAVENOUS

## 2013-01-30 MED ORDER — HYDROCHLOROTHIAZIDE 50 MG PO TABS
50.0000 mg | ORAL_TABLET | Freq: Every day | ORAL | Status: DC
  Filled 2013-01-30: qty 1

## 2013-01-30 MED ORDER — HEPARIN (PORCINE) IN NACL 2-0.9 UNIT/ML-% IJ SOLN
INTRAMUSCULAR | Status: AC
  Filled 2013-01-30: qty 1000

## 2013-01-30 MED ORDER — SODIUM CHLORIDE 0.9 % IV SOLN
INTRAVENOUS | Status: DC
  Administered 2013-01-30: 13:00:00 via INTRAVENOUS

## 2013-01-30 MED ORDER — HEPARIN SODIUM (PORCINE) 1000 UNIT/ML IJ SOLN
INTRAMUSCULAR | Status: AC
  Filled 2013-01-30: qty 1

## 2013-01-30 MED ORDER — CLOPIDOGREL BISULFATE 300 MG PO TABS
ORAL_TABLET | ORAL | Status: AC
  Filled 2013-01-30: qty 1

## 2013-01-30 MED ORDER — FENTANYL CITRATE 0.05 MG/ML IJ SOLN
INTRAMUSCULAR | Status: AC
  Filled 2013-01-30: qty 2

## 2013-01-30 MED ORDER — ACETAMINOPHEN 325 MG PO TABS: 650.0000 mg | ORAL_TABLET | ORAL | Status: DC | PRN

## 2013-01-30 MED ORDER — DIAZEPAM 5 MG PO TABS
ORAL_TABLET | ORAL | Status: AC
  Administered 2013-01-30: 5 mg via ORAL
  Filled 2013-01-30: qty 1

## 2013-01-30 MED ORDER — MORPHINE SULFATE 2 MG/ML IJ SOLN
INTRAMUSCULAR | Status: AC
  Filled 2013-01-30: qty 1

## 2013-01-30 MED ORDER — LIDOCAINE HCL (PF) 1 % IJ SOLN
INTRAMUSCULAR | Status: AC
  Filled 2013-01-30: qty 30

## 2013-01-30 MED ORDER — ASPIRIN EC 325 MG PO TBEC
325.0000 mg | DELAYED_RELEASE_TABLET | Freq: Every day | ORAL | Status: DC
  Filled 2013-01-30: qty 1

## 2013-01-30 MED ORDER — ONDANSETRON HCL 4 MG/2ML IJ SOLN: 4.0000 mg | Freq: Four times a day (QID) | INTRAMUSCULAR | Status: DC | PRN

## 2013-01-30 NOTE — H&P (Signed)
Philip Hurst is an 72 y.o. male.    Chief Complaint: claudication. Life style limiting  HPI: [name], retired Nurse, children's, referred to Dr. Allyson Hurst by Dr. Jeannetta Hurst for PVD. ABI on Rt 0.90, on Lt  0.52 and lt lower ext pain with ambulation.  Had been seen by orthopedic surgeon who felt this pain was related to PVD.  Pt can only walk a block or less before claudication occurs.  Previous cardiac cath with single vessel disease with collaterals treated medically.  Past Medical History  Diagnosis Date  . Hypertension   . Hyperlipidemia   . Coronary artery disease     OCCLUDED RCA  . Claudication of left lower extremity, ABI Lt 0.52 -lifestyle limiting  ABI rt 0.90 01/30/2013  . Normal cardiac stress test, No ischemia 12/15/12 01/30/2013  . CAD (coronary artery disease), single vessel disease with collaterals, medical therapy 09/12/2011    Past Surgical History  Procedure Laterality Date  . Cardiovascular stress test  12/07/2008    EF 58%, NO ISCHEMIA    Family History  Problem Relation Age of Onset  . Seizures Mother   . Cancer Father   . Coronary artery disease Brother   . Diabetes Brother    Social History:  reports that he quit smoking about 15 years ago. He does not have any smokeless tobacco history on file. He reports that he does not drink alcohol or use illicit drugs.  Allergies: No Known Allergies  Medications Prior to Admission  Medication Sig Dispense Refill  . aspirin 81 MG tablet Take 81 mg by mouth daily.        . fish oil-omega-3 fatty acids 1000 MG capsule Take 2 g by mouth daily.        . hydrochlorothiazide (HYDRODIURIL) 50 MG tablet Take 50 mg by mouth daily.      Marland Kitchen HYDROcodone-acetaminophen (NORCO) 10-325 MG per tablet Take 1 tablet by mouth every 4 (four) hours as needed for pain.       Marland Kitchen lovastatin (MEVACOR) 40 MG tablet Take 40 mg by mouth at bedtime.          No results found for this or any previous visit (from the past 48 hour(s)). No results  found.  ROS: General:no colds or fevers, no weight changes Skin:no rashes or ulcers HEENT:no blurred vision, no congestion CV:no chest pain PUL:no SOB GI:no diarrhea constipation or melena, no indigestion GU:no hematuria, no dysuria MS:no joint pain, no claudication, + Leg pain with ambulation Neuro:no syncope, no lightheadedness Endo:no diabetes, no thyroid disease   Blood pressure 145/70, pulse 90, temperature 98.2 F (36.8 C), temperature source Oral, resp. rate 18, height 5\' 4"  (1.626 m), weight 87.998 kg (194 lb), SpO2 95.00%. PE: General:alert NAD, pleasant affect Skin:warm and dry brisk capillary refill, several tattoos HEENT:normocephalic, sclera clear Neck:supple, no JVD, no carotid bruits Heart:S1S2 RRR without murmur gallup rub or click Lungs:clear without rales, rhonchi or wheezes Abd:+ BS, soft, non tender Ext:2+ femoral arteries, soft Lt femoral bruit absent Lt pedal pulse Neuro:alert and oriented X 3 , MAE, follows commands     Assessment/Plan Principal Problem:   Claudication of left lower extremity, ABI Lt 0.52 -lifestyle limiting  ABI rt 0.90 Active Problems:   Hypertension   CAD (coronary artery disease), single vessel disease with collaterals, medical therapy   Hyperlipidemia   Normal cardiac stress test, No ischemia 12/15/12  PLAN: Proceed with PV angio to further evaluate PVD.  Jaaliyah Lucatero R 01/30/2013, 12:41 PM

## 2013-01-30 NOTE — CV Procedure (Signed)
Philip Hurst is a 72 y.o. male    [id number] LOCATION:  FACILITY: [facility]  PHYSICIAN: Nanetta Batty, M.D. 03-17-1941   DATE OF PROCEDURE:  01/30/2013  DATE OF DISCHARGE:  [facility]  PV Intervention    History obtained from chart review. Mr. 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 He was referred by Dr. Windle Guard for peripheral vascular evaluation because of left lower extremity lifestyle limiting claudication. His risk factor profile is remarkable for hypertension and dyslipidemia. He smoked 30 pack years having quit 20 years ago. He had Dopplers performed her office and suggested a left popliteal occlusion and he had a negative Myoview stress test. He presents now for angiography and potential percutaneous intervention.   PROCEDURE DESCRIPTION:    The patient was brought to the second floor [facility] cath lab in the postabsorptive state. He was premedicated with Valium 5 mg by mouth, IV Versed and fentanyl.Marland Kitchen His right groinwas prepped and shaved in usual sterile fashion. Xylocaine 1% was used for local anesthesia. A 5 French sheath was inserted into the right common femoral  artery using standard Seldinger technique. A 5 French pigtail catheter was used for abdominal aortography with bifemoral runoff using bolus chase digital subtraction step table technique. Visipaque dye was used for the entirety of the case. Retrograde aortic pressure was monitored during the case.  HEMODYNAMICS:    AO SYSTOLIC/AO DIASTOLIC: 127/68    ANGIOGRAPHIC RESULTS:   1: Abdominal aortogram-renal arteries were widely patent. The infrarenal abdominal aorta showed mild dilatation but probably not truly aneurysmal.  2: Left lower extremity-there is a moderately long segment occlusion of the above the knee of he'll artery extending below the knee to below the knee a knee popliteal  artery with what appeared to be two-vessel runoff.   3: Right lower  Extremity-normal with two-vessel runoff an occluded posterior tibial   IMPRESSION:Mr. Kavanagh has an occluded popliteal artery responsible for his claudication. We'll proceed with attempt at crossing his chronic total occlusion with a Viance CTO catheter plus or minus chocolate balloon angioplasty plus or minus stenting.  Procedure description: contralateral access was obtained with a crossover catheter, 35 Glidewire, at [employer] wire. A 6 French crossover sheath was advanced over the bifurcation. The patient received a total of 9500 units of heparin with an ending ACT of 225. Total contrast administered the patient was 333 cc. Using an 0 14/300 cm length Sparticore wire along with the Viance CTO catheter I was able to get down to the proximal cap of the occluded popliteal artery. The beyond catheter crossed the CTO was able to reenter the vessel with a Sparta core wire. Following this type performed balloon angioplasty with a 2.5 x 100 cm long Chocolate balloon upgrading to a 4.0 x 80 mm long Chocolate balloon. Because of a suboptimal endoplast result I then stented the balloon segment with overlapping IDEV self-expanding stents (4 x 80, 4 x 40). The final angiographic result with reduction of the moderate leave on our total occlusion of the left popliteal artery to 0% residual with excellent flow. The sheath was then withdrawn back across the bifurcation and secured. The patient received 4 baby aspirin prior to intervention at 300 mg a by mouth Plavix afterwards. He left the Cath Lab in stable condition. The sheath will be removed once the ACT falls below 170 and the patient will be hydrated overnight. He will  be discharged in the morning I will obtain an outpatient Doppler study in my office after which he'll be seen back.   Runell Gess MD, [facility] 01/30/2013 3:22 PM

## 2013-01-30 NOTE — Progress Notes (Signed)
Site area: right groin  Site Prior to Removal:  Level 0  Pressure Applied For 20 MINUTES    Minutes Beginning at 1840  Manual:   yes  Patient Status During Pull:  stable  Post Pull Groin Site:  Level 0  Post Pull Instructions Given:  yes  Post Pull Pulses Present:  yes  Dressing Applied:  yes  Comments:

## 2013-01-30 NOTE — H&P (Signed)
  H & P will be scanned in.  Pt was reexamined and existing H & P reviewed. No changes found.  Runell Gess, MD [facility] 01/30/2013 1:43 PM

## 2013-01-31 LAB — CBC
HCT: 31.9 % — ABNORMAL LOW (ref 39.0–52.0)
Hemoglobin: 11.5 g/dL — ABNORMAL LOW (ref 13.0–17.0)
MCV: 86.2 fL (ref 78.0–100.0)
RBC: 3.7 MIL/uL — ABNORMAL LOW (ref 4.22–5.81)
WBC: 6.5 10*3/uL (ref 4.0–10.5)

## 2013-01-31 LAB — BASIC METABOLIC PANEL
CO2: 27 mEq/L (ref 19–32)
Chloride: 101 mEq/L (ref 96–112)
Glucose, Bld: 107 mg/dL — ABNORMAL HIGH (ref 70–99)
Sodium: 137 mEq/L (ref 135–145)

## 2013-01-31 LAB — GLUCOSE, CAPILLARY: Glucose-Capillary: 115 mg/dL — ABNORMAL HIGH (ref 70–99)

## 2013-01-31 MED ORDER — CLOPIDOGREL BISULFATE 75 MG PO TABS: 75.0000 mg | ORAL_TABLET | Freq: Every day | ORAL | Status: DC

## 2013-01-31 NOTE — Discharge Summary (Signed)
Physician Discharge Summary  Patient ID: Philip Hurst MRN: [id number] DOB/AGE: 12/31/1940 73 y.o.  Admit date: 01/30/2013 Discharge date: 01/31/2013  Admission Diagnoses: Claudication of the left lower extremity  Discharge Diagnoses:  Principal Problem:   Claudication of left lower extremity, ABI Lt 0.52 -lifestyle limiting  ABI rt 0.90 Active Problems:   Hypertension   CAD (coronary artery disease), single vessel disease with collaterals, medical therapy   Hyperlipidemia   Normal cardiac stress test, No ischemia 12/15/12   Discharged Condition: stable  Hospital Course: The patient is a 72 y/o male, who presented to [facility] on 01/30/13 to undergo planned PV angiography for left lower extremity lifestyle-limiting claudication. He was referred to Dr. Allyson Sabal by Dr. Windle Guard for peripheral vascular evaluation. His cardiac risk factor profile is positive for treated hypertension and dyslipidemia. He has a 30-pack-year history of tobacco abuse, having quit 20 years ago. There is no family history of heart disease. He was cathed approximately 12 years ago and found to have 1-vessel disease with collaterals, and has since been treated medically. He has remained stable from a cardiovascular standpoint. In the office, Dr. Allyson Sabal ordered arterial dopplers. The studies suggested left lower extremity occlusive diease.  He had a left ABI of 0.52. ABI on the right was 0.90. Dr. Allyson Sabal performed the [facility] angio. The abdominal aortogram revealed widely patent renal arteries. He had an occluded popliteal artery in the left lower extremity. The right extremity was normal. He underwent sucessful ballon angioplasty and stenting of the occluded segement using a Chocolate ballon and an overlapping IDEV sef-expanding stent. The final angiographic result showed 0% residual with excelent flow. He left the cath lab in stable condition. He was kept overnight for hydration and observation.  He had no post-operative complications.   He was seen the following day by Dr. Allyson Sabal. He had no leg pain and the groin was stable. Dr. Allyson Sabal determined that he was stable for discharge home on ASA and Plavix. He will have lower extremity arterial dopplers as an outpatient, then will follow-up with Dr. Allyson Sabal at [facility] on 02/12/13/.   Consults: None  Significant Diagnostic Studies:   PV angiography HEMODYNAMICS:  AO SYSTOLIC/AO DIASTOLIC: 127/68  ANGIOGRAPHIC RESULTS:  1: Abdominal aortogram-renal arteries were widely patent. The infrarenal abdominal aorta showed mild dilatation but probably not truly aneurysmal.  2: Left lower extremity-there is a moderately long segment occlusion of the above the knee of he'll artery extending below the knee to below the knee a knee popliteal artery with what appeared to be two-vessel runoff.  3: Right lower Extremity-normal with two-vessel runoff an occluded posterior tibial   Treatments: See Hospital Course   Discharge Exam: Blood pressure 151/63, pulse 77, temperature 98.2 F (36.8 C), temperature source Oral, resp. rate 16, height 5\' 4"  (1.626 m), weight 201 lb 15.1 oz (91.6 kg), SpO2 98.00%.   Disposition: 01-Home or Self Care  Discharge Orders   Future Appointments Provider Department Dept Phone   05/05/2013 8:30 AM Delcie Roch [facility] MEDICAL ONCOLOGY [phone]   05/05/2013 9:00 AM Benjiman Core, MD [facility] MEDICAL ONCOLOGY [phone]   Future Orders Complete By Expires     Diet - low sodium heart healthy  As directed     Driving Restrictions  As directed     Comments:      No Driving for 3 days    Increase activity slowly  As directed     Lifting restrictions  As  directed     Comments:      No lifting more than 1/2 gallon of milk for 3 days        Medication List    TAKE these medications       aspirin 81 MG tablet  Take 81 mg by mouth daily.     clopidogrel 75 MG tablet  Commonly known as:  PLAVIX  Take 1 tablet (75 mg  total) by mouth daily with breakfast.     fish oil-omega-3 fatty acids 1000 MG capsule  Take 2 g by mouth daily.     hydrochlorothiazide 50 MG tablet  Commonly known as:  HYDRODIURIL  Take 50 mg by mouth daily.     HYDROcodone-acetaminophen 10-325 MG per tablet  Commonly known as:  NORCO  Take 1 tablet by mouth every 4 (four) hours as needed for pain.     lovastatin 40 MG tablet  Commonly known as:  MEVACOR  Take 40 mg by mouth at bedtime.           Follow-up Information   Follow up with Runell Gess, MD On 02/12/2013. (4:30 pm)    Contact information:   [address] [place] [address] [phone]      TIME SPENT ON DISCHARGE, INCLUDING PHYSICIAN TIME: > 30 MINUTES  Signed: Allayne Butcher, PA-C 01/31/2013, 5:14 PM

## 2013-01-31 NOTE — Progress Notes (Signed)
Subjective:  No CP/SOB/foot pain  Objective:  Temp:  [97.7 F (36.5 C)-98.4 F (36.9 C)] 98.2 F (36.8 C) (04/12 0753) Pulse Rate:  [71-90] 77 (04/12 0753) Resp:  [16-18] 16 (04/12 0753) BP: (120-164)/(49-70) 151/63 mmHg (04/12 0753) SpO2:  [93 %-99 %] 98 % (04/12 0753) Weight:  [87.998 kg (194 lb)-91.6 kg (201 lb 15.1 oz)] 91.6 kg (201 lb 15.1 oz) (04/12 0020) Weight change:   Intake/Output from previous day: 04/11 0701 - 04/12 0700 In: 1086.3 [P.O.:240; I.V.:846.3] Out: 1425 [Urine:1425]  Intake/Output from this shift:    Physical Exam: General appearance: alert and no distress Neck: no adenopathy, no carotid bruit, no JVD, supple, symmetrical, trachea midline and thyroid not enlarged, symmetric, no tenderness/mass/nodules Lungs: clear to auscultation bilaterally Heart: regular rate and rhythm, S1, S2 normal, no murmur, click, rub or gallop Extremities: Right groin OK, Left PP 2+  Lab Results: Results for orders placed during the hospital encounter of 01/30/13 (from the past 48 hour(s))  POCT ACTIVATED CLOTTING TIME     Status: None   Collection Time    01/30/13  2:10 PM      Result Value Range   Activated Clotting Time 225    POCT ACTIVATED CLOTTING TIME     Status: None   Collection Time    01/30/13  2:41 PM      Result Value Range   Activated Clotting Time 219    POCT ACTIVATED CLOTTING TIME     Status: None   Collection Time    01/30/13  3:01 PM      Result Value Range   Activated Clotting Time 225    POCT ACTIVATED CLOTTING TIME     Status: None   Collection Time    01/30/13  6:21 PM      Result Value Range   Activated Clotting Time 165    GLUCOSE, CAPILLARY     Status: Abnormal   Collection Time    01/30/13  9:40 PM      Result Value Range   Glucose-Capillary 176 (*) 70 - 99 mg/dL   Comment 1 Documented in Chart     Comment 2 Notify RN    BASIC METABOLIC PANEL     Status: Abnormal   Collection Time    01/31/13  5:45 AM      Result Value Range   Sodium 137  135 - 145 mEq/L   Potassium 4.1  3.5 - 5.1 mEq/L   Chloride 101  96 - 112 mEq/L   CO2 27  19 - 32 mEq/L   Glucose, Bld 107 (*) 70 - 99 mg/dL   BUN 27 (*) 6 - 23 mg/dL   Creatinine, Ser 1.61  0.50 - 1.35 mg/dL   Calcium 9.2  8.4 - 09.6 mg/dL   GFR calc non Af Amer 59 (*) >90 mL/min   GFR calc Af Amer 69 (*) >90 mL/min   Comment:            The eGFR has been calculated     using the CKD EPI equation.     This calculation has not been     validated in all clinical     situations.     eGFR's persistently     <90 mL/min signify     possible Chronic Kidney Disease.  CBC     Status: Abnormal   Collection Time    01/31/13  5:45 AM      Result Value Range   WBC  6.5  4.0 - 10.5 K/uL   RBC 3.70 (*) 4.22 - 5.81 MIL/uL   Hemoglobin 11.5 (*) 13.0 - 17.0 g/dL   HCT 45.4 (*) 09.8 - 11.9 %   MCV 86.2  78.0 - 100.0 fL   MCH 31.1  26.0 - 34.0 pg   MCHC 36.1 (*) 30.0 - 36.0 g/dL   RDW 14.7  82.9 - 56.2 %   Platelets 171  150 - 400 K/uL  GLUCOSE, CAPILLARY     Status: Abnormal   Collection Time    01/31/13  7:55 AM      Result Value Range   Glucose-Capillary 115 (*) 70 - 99 mg/dL   Comment 1 Notify RN      Imaging: Imaging results have been reviewed  Assessment/Plan:   1. Principal Problem: 2.   Claudication of left lower extremity, ABI Lt 0.52 -lifestyle limiting  ABI rt 0.90 3. Active Problems: 4.   Hypertension 5.   CAD (coronary artery disease), single vessel disease with collaterals, medical therapy 6.   Hyperlipidemia 7.   Normal cardiac stress test, No ischemia 12/15/12 8.   Time Spent Directly with Patient:  20 minutes  Length of Stay:  LOS: 1 day   Looks great S/P PTA Left popliteal artery CTO with Viance CTO device, Chocolate balloon and 2 overlapping IDEV self expanding stents. Labs OK. Exam benign. 2+ Left DP pulse. OK to D/C home on ASA and Plavix. :EA then ROV with me.   Runell Gess 01/31/2013, 8:01 AM

## 2013-02-16 ENCOUNTER — Ambulatory Visit (HOSPITAL_COMMUNITY)
Admission: RE | Admit: 2013-02-16 | Discharge: 2013-02-16 | Disposition: A | Discharge status: Home / Self Care | Payer: Medicare Other | Source: Ambulatory Visit | Attending: Cardiovascular Disease | Admitting: Cardiovascular Disease

## 2013-02-16 DIAGNOSIS — I739 Peripheral vascular disease, unspecified: Secondary | ICD-10-CM | POA: Insufficient documentation

## 2013-02-16 HISTORY — PX: OTHER SURGICAL HISTORY: SHX169

## 2013-02-16 NOTE — Progress Notes (Signed)
Left lower extremity duplex doppler was completed and demonstrated a patent left pop. Stent Larene Pickett RVT

## 2013-03-04 ENCOUNTER — Telehealth: Payer: Self-pay | Admitting: Cardiovascular Disease

## 2013-03-04 NOTE — Telephone Encounter (Signed)
Returning Kathryns call °

## 2013-03-05 NOTE — Telephone Encounter (Signed)
Philip Hurst is Returning [name] Call from a couple days ago .

## 2013-03-05 NOTE — Telephone Encounter (Signed)
I returned patient call about his lower extremity doppler results. Dr. Allyson Sabal reviewed them and his result are normal. Pt notified

## 2013-03-16 ENCOUNTER — Encounter: Payer: Self-pay | Admitting: *Deleted

## 2013-03-18 ENCOUNTER — Encounter: Payer: Self-pay | Admitting: Cardiovascular Disease

## 2013-03-19 ENCOUNTER — Encounter: Payer: Self-pay | Admitting: Cardiovascular Disease

## 2013-03-19 ENCOUNTER — Ambulatory Visit (INDEPENDENT_AMBULATORY_CARE_PROVIDER_SITE_OTHER): Payer: Medicare Other | Admitting: Cardiovascular Disease

## 2013-03-19 VITALS — BP 130/70 | HR 75 | Ht 63.0 in | Wt 190.0 lb

## 2013-03-19 DIAGNOSIS — I739 Peripheral vascular disease, unspecified: Secondary | ICD-10-CM

## 2013-03-19 NOTE — Assessment & Plan Note (Signed)
The patient was sent to me by Dr. Windle Guard for left lower extremity claudication. Original Dopplers in our office performed 01/19/13 revealed a left ABI 0.52 with an occluded popliteal. I had given him 01/30/13 revealing a total occlusion of the left popliteal artery which I recanalized and stented using an IDEV self-expanding stent with an excellent antibiotic result.his followup Dopplers performed 02/08/13 revealed a left ABI of 1 with a widely patent popliteal artery. His claudication has resolved. He was initially on aspirin Plavix but developed a Plavix allergy and this was discontinued.

## 2013-03-19 NOTE — Patient Instructions (Signed)
Your physician wants you to follow-up in: 12 months. You will receive a reminder letter in the mail two months in advance. If you don't receive a letter, please call our office to schedule the follow-up appointment.   Dr Allyson Sabal has ordered a lower extremity doppler to be done every 6 months

## 2013-03-19 NOTE — Progress Notes (Signed)
03/19/2013 Ivette Loyal   21-Jan-1941  [id number]  Primary Physician Kaleen Mask, MD Primary Cardiologist: Runell Gess MD Roseanne Reno   HPI:  The patient is a 72 year old Caucasian male who is overweight. He has a history of peripheral artery disease and is having symptoms of claudication with a left lower extremity ABI of 0.52 and a right of 0.90. His history also includes hypertension, coronary artery disease, hyperlipidemia. He had a normal cardiac stress test on December 15, 2012 which was nonischemic. He also has dyslipidemia and has remote tobacco abuse but quit 20 years ago. Dr. Allyson Sabal recently performed a lower extremity angiogram which revealed an occluded popliteal artery in the left lower extremity. The right was normal. He also had widely patent renal arteries. He underwent successful balloon angioplasty and stenting of the occluded segment using a chocolate balloon, overlapping IDEV self-expanding stent. The patient now presents today in followup to that procedure. He appears to be doing quite well. He is feeling a little bit under the weather, however, he feels that the left lower extremity claudication symptoms have resolved. He has not done quite as much walking as he used to, but on one of his walks which normally would have caused him problems he did not have any pain. He also denies nausea, vomiting, fever, dizziness, lightheadedness, chest pain, shortness of breath, edema.   The patient was taking Plavix upon discharge from the hospital but developed an allergy to it and was stopped. His aspirin was then increased to 325 mg daily.he had a followup Doppler study performed 02/16/13 revealing a left ABI of 1 with a widely patent popliteal artery. He currently denies claudication.      Current Outpatient Prescriptions  Medication Sig Dispense Refill  . aspirin 325 MG tablet Take 325 mg by mouth daily.      . fish oil-omega-3 fatty acids 1000 MG capsule Take 2 g  by mouth daily.        . hydrochlorothiazide (HYDRODIURIL) 25 MG tablet Take 25 mg by mouth daily.      Marland Kitchen HYDROcodone-acetaminophen (NORCO) 10-325 MG per tablet Take 1 tablet by mouth every 4 (four) hours as needed for pain.       Marland Kitchen losartan (COZAAR) 100 MG tablet Take 100 mg by mouth daily.      Marland Kitchen lovastatin (MEVACOR) 40 MG tablet Take 40 mg by mouth at bedtime.        . clopidogrel (PLAVIX) 75 MG tablet Take 1 tablet (75 mg total) by mouth daily with breakfast.  30 tablet  11   No current facility-administered medications for this visit.    No Known Allergies  History   Social History  . Marital Status: Married    Spouse Name: Britta Mccreedy    Number of Children: 4  . Years of Education: N/A   Occupational History  . Not on file.   Social History Main Topics  . Smoking status: Former Smoker    Quit date: 08/26/1997  . Smokeless tobacco: Not on file  . Alcohol Use: No  . Drug Use: No  . Sexually Active: Not on file   Other Topics Concern  . Not on file   Social History Narrative  . No narrative on file     Review of Systems: General: negative for chills, fever, night sweats or weight changes.  Cardiovascular: negative for chest pain, dyspnea on exertion, edema, orthopnea, palpitations, paroxysmal nocturnal dyspnea or shortness of breath Dermatological: negative for rash Respiratory: negative for  cough or wheezing Urologic: negative for hematuria Abdominal: negative for nausea, vomiting, diarrhea, bright red blood per rectum, melena, or hematemesis Neurologic: negative for visual changes, syncope, or dizziness All other systems reviewed and are otherwise negative except as noted above.    Blood pressure 130/70, pulse 75, height 5\' 3"  (1.6 m), weight 190 lb (86.183 kg).  General appearance: alert and no distress Neck: no adenopathy, no carotid bruit, no JVD, supple, symmetrical, trachea midline and thyroid not enlarged, symmetric, no tenderness/mass/nodules Lungs: clear  to auscultation bilaterally Heart: regular rate and rhythm, S1, S2 normal, no murmur, click, rub or gallop Extremities: extremities normal, atraumatic, no cyanosis or edema  EKG not performed today  ASSESSMENT AND PLAN:   Claudication of left lower extremity, ABI Lt 0.52 -lifestyle limiting  ABI rt 0.90 The patient was sent to me by Dr. Windle Guard for left lower extremity claudication. Original Dopplers in our office performed 01/19/13 revealed a left ABI 0.52 with an occluded popliteal. I had given him 01/30/13 revealing a total occlusion of the left popliteal artery which I recanalized and stented using an IDEV self-expanding stent with an excellent antibiotic result.his followup Dopplers performed 02/08/13 revealed a left ABI of 1 with a widely patent popliteal artery. His claudication has resolved. He was initially on aspirin Plavix but developed a Plavix allergy and this was discontinued.      Runell Gess MD FACP,FACC,FAHA, [facility] 03/19/2013 1:59 PM

## 2013-05-04 ENCOUNTER — Telehealth: Payer: Self-pay | Admitting: *Deleted

## 2013-05-04 NOTE — Telephone Encounter (Signed)
Patient called and left message via voicemail that he needs to cancel his appts for tomorrow.  JMW

## 2013-05-05 ENCOUNTER — Other Ambulatory Visit: Payer: Medicare Other | Admitting: Lab

## 2013-05-05 ENCOUNTER — Ambulatory Visit: Payer: Medicare Other | Admitting: Oncology

## 2013-07-31 ENCOUNTER — Ambulatory Visit (HOSPITAL_COMMUNITY)
Admission: RE | Admit: 2013-07-31 | Discharge: 2013-07-31 | Disposition: A | Discharge status: Home / Self Care | Payer: Medicare Other | Source: Ambulatory Visit | Attending: Cardiology | Admitting: Cardiology

## 2013-07-31 ENCOUNTER — Encounter: Payer: Self-pay | Admitting: Cardiology

## 2013-07-31 ENCOUNTER — Ambulatory Visit (INDEPENDENT_AMBULATORY_CARE_PROVIDER_SITE_OTHER): Payer: Medicare Other | Admitting: Cardiology

## 2013-07-31 VITALS — BP 120/70 | HR 97 | Ht 63.0 in | Wt 193.1 lb

## 2013-07-31 DIAGNOSIS — Z136 Encounter for screening for cardiovascular disorders: Secondary | ICD-10-CM

## 2013-07-31 DIAGNOSIS — I739 Peripheral vascular disease, unspecified: Secondary | ICD-10-CM | POA: Insufficient documentation

## 2013-07-31 DIAGNOSIS — R000 Tachycardia, unspecified: Secondary | ICD-10-CM

## 2013-07-31 DIAGNOSIS — IMO0001 Reserved for inherently not codable concepts without codable children: Secondary | ICD-10-CM

## 2013-07-31 DIAGNOSIS — I2510 Atherosclerotic heart disease of native coronary artery without angina pectoris: Secondary | ICD-10-CM

## 2013-07-31 DIAGNOSIS — I10 Essential (primary) hypertension: Secondary | ICD-10-CM

## 2013-07-31 DIAGNOSIS — M79609 Pain in unspecified limb: Secondary | ICD-10-CM | POA: Insufficient documentation

## 2013-07-31 MED ORDER — CILOSTAZOL 50 MG PO TABS: 50.0000 mg | ORAL_TABLET | Freq: Two times a day (BID) | ORAL | Status: DC

## 2013-07-31 NOTE — Progress Notes (Signed)
07/31/2013   PCP: Kaleen Mask, MD   Chief Complaint  Patient presents with  . Follow-up    pain in left calf when he walk, had stents place in leg earlier in the year    Primary Cardiologist: Dr. Allyson Sabal  HPI:72 year old Caucasian male has a history of peripheral artery disease and is having symptoms of claudication with a left lower extremity.  The claudication is just like claudication in April prior to PCI.   The pain is with exertion  He walks 1-2 blocks and pain occurs in his lt calf then radiates into his upper leg.  At that time  ABI of Left 0.52 and a right of 0.90. His history also includes hypertension, coronary artery disease, hyperlipidemia. He had a normal cardiac stress test on December 15, 2012 which was nonischemic. He also has dyslipidemia and has remote tobacco abuse but quit 20 years ago. Dr. Allyson Sabal recently performed a lower extremity angiogram which revealed an occluded popliteal artery in the left lower extremity. The right was normal. He also had widely patent renal arteries. He underwent successful balloon angioplasty and stenting of the occluded segment using a chocolate balloon, overlapping IDEV self-expanding stent.   His post procedure dopplers performed 02/16/13 revealing a left ABI of 1.0 with a widely patent popliteal artery. Rt. ABI was 1.1.    The patient was taking Plavix upon discharge from the hospital but developed an allergy to it and was stopped. His aspirin was then increased to 325 mg daily.  He denies any chest pain or SOB.  No lightheadedness or dizziness.  His leg pain resolves with rest.     Allergies  Allergen Reactions  . Plavix [Clopidogrel Bisulfate] Itching and Rash    Current Outpatient Prescriptions  Medication Sig Dispense Refill  . aspirin 325 MG tablet Take 325 mg by mouth daily.      . fish oil-omega-3 fatty acids 1000 MG capsule Take 2 g by mouth daily.        . hydrochlorothiazide (HYDRODIURIL) 25 MG tablet  Take 25 mg by mouth daily.      Marland Kitchen HYDROcodone-acetaminophen (NORCO) 10-325 MG per tablet Take 1 tablet by mouth every 4 (four) hours as needed for pain.       Marland Kitchen losartan (COZAAR) 100 MG tablet Take 100 mg by mouth daily.      Marland Kitchen lovastatin (MEVACOR) 40 MG tablet Take 40 mg by mouth at bedtime.        . cilostazol (PLETAL) 50 MG tablet Take 1 tablet (50 mg total) by mouth 2 (two) times daily.  60 tablet  6   No current facility-administered medications for this visit.    Past Medical History  Diagnosis Date  . Hypertension   . Hyperlipidemia   . Coronary artery disease     OCCLUDED RCA  . Claudication of left lower extremity, ABI Lt 0.52 -lifestyle limiting  ABI rt 0.90 01/30/2013  . Normal cardiac stress test, No ischemia 12/15/12 01/30/2013  . CAD (coronary artery disease), single vessel disease with collaterals, medical therapy 09/12/2011    Past Surgical History  Procedure Laterality Date  . Cardiovascular stress test  12/07/2008    EF 58%, NO ISCHEMIA  . Popliteal artery stent Left 01/30/13  . Cardiac catheterization  @2002     1 vessel disease w/collaterals,medical therapy  . Back surgery    . Hand surgery    . Lower extremities arterial doppler  02/16/2013  bilateral ABIs normal values w/o evidence arterial insuff. at rest; left  lower extrem. norm. left popliteal artery stent norm. patency    ZOX:WRUEAVW:UJ colds or fevers, no weight changes Skin:no rashes or ulcers HEENT:no blurred vision, no congestion CV:see HPI PUL:see HPI GI:no diarrhea constipation or melena, no indigestion GU:no hematuria, no dysuria MS:no joint pain, + claudication of Lt leg only Neuro:no syncope, no lightheadedness Endo:no diabetes, no thyroid disease  PHYSICAL EXAM BP 120/70  Pulse 97  Ht 5\' 3"  (1.6 m)  Wt 193 lb 1.6 oz (87.59 kg)  BMI 34.21 kg/m2 General:Pleasant affect, NAD Skin:Warm and dry, brisk capillary refill HEENT:normocephalic, sclera clear, mucus membranes moist Neck:supple,  no JVD, no bruits  Heart:S1S2 RRR without murmur, gallup, rub or click Lungs:clear without rales, rhonchi, or wheezes WJX:BJYN, non tender, + BS, do not palpate liver spleen or masses Ext:no lower ext edema, 1+ pedal pulses, 2+ radial pulses, both feet equally warm with same color. Neuro:alert and oriented, MAE, follows commands, + facial symmetry  EKG: SR no acute changes from previous tracing, EKG done due to tachycardia on initial VS check.  ASSESSMENT AND PLAN Claudication of left lower extremity, ABI on Lt now  .80 or less down from 1.0 in 01/2013 Left lower extremity claudication.  Pt did well since PCI of  Popliteal in April of last year.  ABI's improved to 1.0 on Lt leg.  Now claudication has returned with pain in Lt calf and up lt leg.  Will recheck doppler and add pletal to medications.  Pt had an allergy to Plavix. He is on a full dose of ASA.  He will then follow up with Dr. Allyson Sabal to decide medication vs. PCI.  Lt arterial doppler with 80% stenosis proximal to popliteal stent.  ABI Lt 0.80 or less.    CAD (coronary artery disease), single vessel disease with collaterals, medical therapy Stable no chest pain.  Hypertension stable  Normal cardiac stress test, No ischemia 12/15/12 Stable

## 2013-07-31 NOTE — Assessment & Plan Note (Signed)
stable °

## 2013-07-31 NOTE — Assessment & Plan Note (Signed)
Stable

## 2013-07-31 NOTE — Assessment & Plan Note (Addendum)
Left lower extremity claudication.  Pt did well since PCI of  Popliteal in April of last year.  ABI's improved to 1.0 on Lt leg.  Now claudication has returned with pain in Lt calf and up lt leg.  Will recheck doppler and add pletal to medications.  Pt had an allergy to Plavix. He is on a full dose of ASA.  He will then follow up with Dr. Allyson Sabal to decide medication vs. PCI.  Lt arterial doppler with 80% stenosis proximal to popliteal stent.  ABI Lt 0.80 or less.

## 2013-07-31 NOTE — Assessment & Plan Note (Signed)
Stable no chest pain. 

## 2013-07-31 NOTE — Patient Instructions (Addendum)
Will have Lt lower ext. arterial dopplers done today.   I am adding Pletal to medications to help with the pain for now.  We will have you follow up with Dr. Allyson Sabal in next week and make plan.   If your leg becomes cold increased severe pain or pain at rest, call us or go to ER.  Do not anticipate this but you do have a blockage. You still have blood flow.

## 2013-07-31 NOTE — Progress Notes (Signed)
Left Lower Extremity Arterial Duplex Completed. °[name],RVT °

## 2013-08-03 ENCOUNTER — Encounter: Payer: Self-pay | Admitting: Cardiovascular Disease

## 2013-08-03 ENCOUNTER — Ambulatory Visit (INDEPENDENT_AMBULATORY_CARE_PROVIDER_SITE_OTHER): Payer: Medicare Other | Admitting: Cardiovascular Disease

## 2013-08-03 VITALS — BP 110/78 | HR 92 | Ht 63.0 in | Wt 192.0 lb

## 2013-08-03 DIAGNOSIS — D689 Coagulation defect, unspecified: Secondary | ICD-10-CM

## 2013-08-03 DIAGNOSIS — R5383 Other fatigue: Secondary | ICD-10-CM

## 2013-08-03 DIAGNOSIS — R5381 Other malaise: Secondary | ICD-10-CM

## 2013-08-03 DIAGNOSIS — Z01818 Encounter for other preprocedural examination: Secondary | ICD-10-CM

## 2013-08-03 DIAGNOSIS — I739 Peripheral vascular disease, unspecified: Secondary | ICD-10-CM

## 2013-08-03 DIAGNOSIS — Z79899 Other long term (current) drug therapy: Secondary | ICD-10-CM

## 2013-08-03 NOTE — Assessment & Plan Note (Signed)
Under good control on current medications 

## 2013-08-03 NOTE — Patient Instructions (Signed)
Dr. Allyson Sabal has ordered a peripheral angiogram to be done at [facility].  This procedure is going to look at the bloodflow in your lower extremities.  If Dr. Allyson Sabal is able to open up the arteries, you will have to spend one night in the hospital.  If he is not able to open the arteries, you will be able to go home that same day.    After the procedure, you will not be allowed to drive for 3 days or push, pull, or lift anything greater than 10 lbs for one week.    You will be required to have bloodwork  prior to your procedure.  Our scheduler will advise you on when these items need to be done.    REPS: Alphonzo Dublin

## 2013-08-03 NOTE — Assessment & Plan Note (Signed)
On statin therapy followed by his primary cardiologist

## 2013-08-03 NOTE — Assessment & Plan Note (Signed)
I performed percutaneous intervention on 01/30/13 with recanalization of a left popliteal chronic total occlusion with overlapping IDEV stents. His post procedure Dopplers were excellent ABI 1. 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

## 2013-08-03 NOTE — Progress Notes (Signed)
   08/03/2013 Philip Hurst   72/05/02/1941  [id number]  Primary Physician ELKINS,WILSON OLIVER, MD Primary Cardiologist: Nilton Lave J. Lathyn Griggs MD FACP,FACC,FAHA, FSCAI   HPI:  The patient is a 72-year-old Caucasian male who is overweight. He has a history of peripheral artery disease and is having symptoms of claudication with a left lower extremity ABI of 0.52 and a right of 0.90. His history also includes hypertension, coronary artery disease, hyperlipidemia. He had a normal cardiac stress test on December 15, 2012 which was nonischemic. He also has dyslipidemia and has remote tobacco abuse but quit 20 years ago. Dr. Tandi Hanko recently performed a lower extremity angiogram which revealed an occluded popliteal artery in the left lower extremity. The right was normal. He also had widely patent renal arteries. He underwent successful balloon angioplasty and stenting of the occluded segment using a chocolate balloon, overlapping IDEV self-expanding stent. The patient now presents today in followup to that procedure. He appears to be doing quite well. He is feeling a little bit under the weather, however, he feels that the left lower extremity claudication symptoms have resolved. He has not done quite as much walking as he used to, but on one of his walks which normally would have caused him problems he did not have any pain. He also denies nausea, vomiting, fever, dizziness, lightheadedness, chest pain, shortness of breath, edema.   The patient was taking Plavix upon discharge from the hospital but developed an allergy to it and was stopped. His aspirin was then increased to 325 mg daily.he had a followup Doppler study performed 02/16/13 revealing a left ABI of 1 with a widely patent popliteal artery.   Over the last several weeks has developed recurrent left calf claudication. 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 He will need repeat angiography and intervention.    Current Outpatient Prescriptions  Medication Sig Dispense Refill  . aspirin 325 MG tablet Take 325 mg by mouth daily.      . cilostazol (PLETAL) 50 MG tablet Take 1 tablet (50 mg total) by mouth 2 (two) times daily.  60 tablet  6  . fish oil-omega-3 fatty acids 1000 MG capsule Take 2 g by mouth daily.        . hydrochlorothiazide (HYDRODIURIL) 25 MG tablet Take 25 mg by mouth daily.      . HYDROcodone-acetaminophen (NORCO) 10-325 MG per tablet Take 1 tablet by mouth every 4 (four) hours as needed for pain.       . losartan (COZAAR) 100 MG tablet Take 100 mg by mouth daily.      . lovastatin (MEVACOR) 40 MG tablet Take 40 mg by mouth at bedtime.         No current facility-administered medications for this visit.    Allergies  Allergen Reactions  . Plavix [Clopidogrel Bisulfate] Itching and Rash    History   Social History  . Marital Status: Married    Spouse Name: barbara    Number of Children: 4  . Years of Education: N/A   Occupational History  . Not on file.   Social History Main Topics  . Smoking status: Former Smoker    Quit date: 08/26/1997  . Smokeless tobacco: Never Used  . Alcohol Use: No  . Drug Use: No  . Sexual Activity: Not on file   Other Topics Concern  . Not on file     Social History Narrative  . No narrative on file     Review of Systems: General: negative for chills, fever, night sweats or weight changes.  Cardiovascular: negative for chest pain, dyspnea on exertion, edema, orthopnea, palpitations, paroxysmal nocturnal dyspnea or shortness of breath Dermatological: negative for rash Respiratory: negative for cough or wheezing Urologic: negative for hematuria Abdominal: negative for nausea, vomiting, diarrhea, bright red blood per rectum, melena, or hematemesis Neurologic: negative for visual changes, syncope, or dizziness All other systems  reviewed and are otherwise negative except as noted above.    Blood pressure 110/78, pulse 92, height 5' 3" (1.6 m), weight 192 lb (87.091 kg).  General appearance: alert and no distress Neck: no adenopathy, no carotid bruit, no JVD, supple, symmetrical, trachea midline and thyroid not enlarged, symmetric, no tenderness/mass/nodules Lungs: clear to auscultation bilaterally Heart: regular rate and rhythm, S1, S2 normal, no murmur, click, rub or gallop Extremities: extremities normal, atraumatic, no cyanosis or edema and diminished pedal pulses bilaterally  EKG not performed today  ASSESSMENT AND PLAN:   Claudication of left lower extremity, ABI on Lt now  .80 or less down from 1.0 in 01/2013 I performed percutaneous intervention on 01/30/13 with recanalization of a left popliteal chronic total occlusion with overlapping IDEV stents. His post procedure Dopplers were excellent ABI 1. 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  Hypertension Under good control on current medications  Hyperlipidemia On statin therapy followed by his primary cardiologist      Lillianah Swartzentruber J. Zaylen Susman MD FACP,FACC,FAHA, FSCAI 08/03/2013 10:37 AM  

## 2013-08-13 ENCOUNTER — Telehealth: Payer: Self-pay | Admitting: Cardiovascular Disease

## 2013-08-13 NOTE — Telephone Encounter (Signed)
Forgot to get his blood test,scheduled for a procedure on 08-18-13.Can he still get it today or tomorrow?

## 2013-08-13 NOTE — Telephone Encounter (Signed)
Returned call and pt verified x 2.  Pt informed message received and he can get labs done today or tomorrow.  Pt asked if he can just go in the morning.  Pt informed they need to be completed prior to procedure.  Pt verbalized understanding and agreed w/ plan.

## 2013-08-14 ENCOUNTER — Encounter (HOSPITAL_COMMUNITY): Payer: Self-pay | Admitting: Pharmacy Technician

## 2013-08-14 LAB — BASIC METABOLIC PANEL
BUN: 28 mg/dL — ABNORMAL HIGH (ref 6–23)
CO2: 29 mEq/L (ref 19–32)
Calcium: 9.7 mg/dL (ref 8.4–10.5)
Creat: 1.48 mg/dL — ABNORMAL HIGH (ref 0.50–1.35)
Glucose, Bld: 106 mg/dL — ABNORMAL HIGH (ref 70–99)
Sodium: 138 mEq/L (ref 135–145)

## 2013-08-14 LAB — PROTIME-INR
INR: 1 (ref <1.50)
Prothrombin Time: 13.2 seconds (ref 11.6–15.2)

## 2013-08-14 LAB — CBC
HCT: 35.1 % — ABNORMAL LOW (ref 39.0–52.0)
MCH: 31.1 pg (ref 26.0–34.0)
MCHC: 34.5 g/dL (ref 30.0–36.0)
Platelets: 254 10*3/uL (ref 150–400)
RDW: 14 % (ref 11.5–15.5)
WBC: 8.9 10*3/uL (ref 4.0–10.5)

## 2013-08-18 ENCOUNTER — Encounter: Payer: Self-pay | Admitting: *Deleted

## 2013-08-18 ENCOUNTER — Ambulatory Visit (HOSPITAL_COMMUNITY)
Admission: RE | Admit: 2013-08-18 | Discharge: 2013-08-19 | Disposition: A | Discharge status: Home / Self Care | Payer: Medicare Other | Source: Ambulatory Visit | Attending: Cardiovascular Disease | Admitting: Cardiovascular Disease

## 2013-08-18 ENCOUNTER — Encounter (HOSPITAL_COMMUNITY): Payer: Self-pay | Admitting: General Practice

## 2013-08-18 ENCOUNTER — Encounter (HOSPITAL_COMMUNITY)
Admission: RE | Disposition: A | Discharge status: Home / Self Care | Payer: Self-pay | Source: Ambulatory Visit | Attending: Cardiovascular Disease

## 2013-08-18 DIAGNOSIS — Z01818 Encounter for other preprocedural examination: Secondary | ICD-10-CM

## 2013-08-18 DIAGNOSIS — I70219 Atherosclerosis of native arteries of extremities with intermittent claudication, unspecified extremity: Secondary | ICD-10-CM | POA: Insufficient documentation

## 2013-08-18 DIAGNOSIS — E785 Hyperlipidemia, unspecified: Secondary | ICD-10-CM | POA: Diagnosis present

## 2013-08-18 DIAGNOSIS — I10 Essential (primary) hypertension: Secondary | ICD-10-CM | POA: Diagnosis present

## 2013-08-18 DIAGNOSIS — I2510 Atherosclerotic heart disease of native coronary artery without angina pectoris: Secondary | ICD-10-CM | POA: Insufficient documentation

## 2013-08-18 DIAGNOSIS — I739 Peripheral vascular disease, unspecified: Secondary | ICD-10-CM

## 2013-08-18 HISTORY — DX: Low back pain: M54.5

## 2013-08-18 HISTORY — PX: POPLITEAL ARTERY STENT: SHX2243

## 2013-08-18 HISTORY — DX: Unspecified osteoarthritis, unspecified site: M19.90

## 2013-08-18 HISTORY — PX: LOWER EXTREMITY ANGIOGRAM: SHX5508

## 2013-08-18 HISTORY — DX: Low back pain, unspecified: M54.50

## 2013-08-18 HISTORY — DX: Peripheral vascular disease, unspecified: I73.9

## 2013-08-18 HISTORY — DX: Other chronic pain: G89.29

## 2013-08-18 LAB — POCT ACTIVATED CLOTTING TIME
Activated Clotting Time: 206 seconds
Activated Clotting Time: 211 seconds
Activated Clotting Time: 170 seconds

## 2013-08-18 SURGERY — ANGIOGRAM, LOWER EXTREMITY
Anesthesia: LOCAL | Laterality: Left

## 2013-08-18 MED ORDER — MORPHINE SULFATE 2 MG/ML IJ SOLN
2.0000 mg | INTRAMUSCULAR | Status: DC | PRN
  Administered 2013-08-18: 19:00:00 2 mg via INTRAVENOUS
  Filled 2013-08-18: qty 1

## 2013-08-18 MED ORDER — HEPARIN (PORCINE) IN NACL 2-0.9 UNIT/ML-% IJ SOLN
INTRAMUSCULAR | Status: AC
  Filled 2013-08-18: qty 1000

## 2013-08-18 MED ORDER — SODIUM CHLORIDE 0.9 % IV SOLN
INTRAVENOUS | Status: DC
  Administered 2013-08-18: 12:00:00 via INTRAVENOUS

## 2013-08-18 MED ORDER — SODIUM CHLORIDE 0.9 % IJ SOLN: 3.0000 mL | INTRAMUSCULAR | Status: DC | PRN

## 2013-08-18 MED ORDER — HYDROCODONE-ACETAMINOPHEN 10-325 MG PO TABS
1.0000 | ORAL_TABLET | ORAL | Status: DC | PRN
  Administered 2013-08-18 – 2013-08-19 (×3): 1 via ORAL
  Filled 2013-08-18 (×3): qty 1

## 2013-08-18 MED ORDER — TICAGRELOR 90 MG PO TABS
ORAL_TABLET | ORAL | Status: AC
  Filled 2013-08-18: qty 2

## 2013-08-18 MED ORDER — ASPIRIN EC 325 MG PO TBEC
325.0000 mg | DELAYED_RELEASE_TABLET | Freq: Every day | ORAL | Status: DC
  Administered 2013-08-19: 10:00:00 325 mg via ORAL
  Filled 2013-08-18: qty 1

## 2013-08-18 MED ORDER — HYDROCHLOROTHIAZIDE 25 MG PO TABS
25.0000 mg | ORAL_TABLET | Freq: Every day | ORAL | Status: DC
  Administered 2013-08-19: 10:00:00 25 mg via ORAL
  Filled 2013-08-18: qty 1

## 2013-08-18 MED ORDER — LOSARTAN POTASSIUM 50 MG PO TABS
100.0000 mg | ORAL_TABLET | Freq: Every day | ORAL | Status: DC
  Administered 2013-08-19: 10:00:00 100 mg via ORAL
  Filled 2013-08-18: qty 2

## 2013-08-18 MED ORDER — ASPIRIN 81 MG PO CHEW
81.0000 mg | CHEWABLE_TABLET | ORAL | Status: AC
  Administered 2013-08-18: 81 mg via ORAL
  Filled 2013-08-18: qty 1

## 2013-08-18 MED ORDER — MIDAZOLAM HCL 2 MG/2ML IJ SOLN
INTRAMUSCULAR | Status: AC
  Filled 2013-08-18: qty 2

## 2013-08-18 MED ORDER — LIDOCAINE HCL (PF) 1 % IJ SOLN
INTRAMUSCULAR | Status: AC
  Filled 2013-08-18: qty 30

## 2013-08-18 MED ORDER — SODIUM CHLORIDE 0.9 % IV SOLN: INTRAVENOUS | Status: AC

## 2013-08-18 MED ORDER — ASPIRIN 325 MG PO TABS: 325.0000 mg | ORAL_TABLET | Freq: Two times a day (BID) | ORAL | Status: DC

## 2013-08-18 MED ORDER — CILOSTAZOL 50 MG PO TABS
50.0000 mg | ORAL_TABLET | Freq: Two times a day (BID) | ORAL | Status: DC
  Administered 2013-08-18 – 2013-08-19 (×2): 50 mg via ORAL
  Filled 2013-08-18 (×3): qty 1

## 2013-08-18 MED ORDER — DIAZEPAM 5 MG PO TABS
5.0000 mg | ORAL_TABLET | ORAL | Status: AC
  Administered 2013-08-18: 5 mg via ORAL
  Filled 2013-08-18: qty 1

## 2013-08-18 MED ORDER — ACETAMINOPHEN 325 MG PO TABS: 650.0000 mg | ORAL_TABLET | ORAL | Status: DC | PRN

## 2013-08-18 MED ORDER — FENTANYL CITRATE 0.05 MG/ML IJ SOLN
INTRAMUSCULAR | Status: AC
  Filled 2013-08-18: qty 2

## 2013-08-18 MED ORDER — LOSARTAN POTASSIUM 50 MG PO TABS: 100.0000 mg | ORAL_TABLET | Freq: Every day | ORAL | Status: DC

## 2013-08-18 MED ORDER — ONDANSETRON HCL 4 MG/2ML IJ SOLN
4.0000 mg | Freq: Four times a day (QID) | INTRAMUSCULAR | Status: DC | PRN
  Administered 2013-08-18: 19:00:00 4 mg via INTRAVENOUS
  Filled 2013-08-18: qty 2

## 2013-08-18 MED ORDER — TICAGRELOR 90 MG PO TABS
90.0000 mg | ORAL_TABLET | Freq: Two times a day (BID) | ORAL | Status: DC
  Administered 2013-08-18 – 2013-08-19 (×2): 90 mg via ORAL
  Filled 2013-08-18 (×3): qty 1

## 2013-08-18 MED ORDER — HEPARIN SODIUM (PORCINE) 1000 UNIT/ML IJ SOLN
INTRAMUSCULAR | Status: AC
  Filled 2013-08-18: qty 1

## 2013-08-18 NOTE — Interval H&P Note (Signed)
History and Physical Interval Note:  08/18/2013 3:33 PM  Philip Hurst  has presented today for surgery, with the diagnosis of claudication  The various methods of treatment have been discussed with the patient and family. After consideration of risks, benefits and other options for treatment, the patient has consented to  Procedure(s): LOWER EXTREMITY ANGIOGRAM (N/A) as a surgical intervention .  The patient's history has been reviewed, patient examined, no change in status, stable for surgery.  I have reviewed the patient's chart and labs.  Questions were answered to the patient's satisfaction.     Runell Gess

## 2013-08-18 NOTE — Progress Notes (Signed)
Site area: right groin  Site Prior to Removal:  Level 0  Pressure Applied For 25 MINUTES    Minutes Beginning at 1905  Manual:   yes  Patient Status During Pull:  stable  Post Pull Groin Site:  Level 0  Post Pull Instructions Given:  yes  Post Pull Pulses Present:  yes  Dressing Applied:  yes  Comments:  Gauze pressure dressing applied.

## 2013-08-18 NOTE — H&P (View-Only) (Signed)
08/03/2013 Ivette Loyal   [birth year]  [id number]  Primary Physician Kaleen Mask, MD Primary Cardiologist: Runell Gess MD Roseanne Reno   HPI:  The patient is a 72 year old Caucasian male who is overweight. He has a history of peripheral artery disease and is having symptoms of claudication with a left lower extremity ABI of 0.52 and a right of 0.90. His history also includes hypertension, coronary artery disease, hyperlipidemia. He had a normal cardiac stress test on December 15, 2012 which was nonischemic. He also has dyslipidemia and has remote tobacco abuse but quit 20 years ago. Dr. Allyson Sabal recently performed a lower extremity angiogram which revealed an occluded popliteal artery in the left lower extremity. The right was normal. He also had widely patent renal arteries. He underwent successful balloon angioplasty and stenting of the occluded segment using a chocolate balloon, overlapping IDEV self-expanding stent. The patient now presents today in followup to that procedure. He appears to be doing quite well. He is feeling a little bit under the weather, however, he feels that the left lower extremity claudication symptoms have resolved. He has not done quite as much walking as he used to, but on one of his walks which normally would have caused him problems he did not have any pain. He also denies nausea, vomiting, fever, dizziness, lightheadedness, chest pain, shortness of breath, edema.   The patient was taking Plavix upon discharge from the hospital but developed an allergy to it and was stopped. His aspirin was then increased to 325 mg daily.he had a followup Doppler study performed 02/16/13 revealing a left ABI of 1 with a widely patent popliteal artery.   Over the last several weeks has developed recurrent left calf claudication. 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 He will need repeat angiography and intervention.    Current Outpatient Prescriptions  Medication Sig Dispense Refill  . aspirin 325 MG tablet Take 325 mg by mouth daily.      . cilostazol (PLETAL) 50 MG tablet Take 1 tablet (50 mg total) by mouth 2 (two) times daily.  60 tablet  6  . fish oil-omega-3 fatty acids 1000 MG capsule Take 2 g by mouth daily.        . hydrochlorothiazide (HYDRODIURIL) 25 MG tablet Take 25 mg by mouth daily.      Marland Kitchen HYDROcodone-acetaminophen (NORCO) 10-325 MG per tablet Take 1 tablet by mouth every 4 (four) hours as needed for pain.       Marland Kitchen losartan (COZAAR) 100 MG tablet Take 100 mg by mouth daily.      Marland Kitchen lovastatin (MEVACOR) 40 MG tablet Take 40 mg by mouth at bedtime.         No current facility-administered medications for this visit.    Allergies  Allergen Reactions  . Plavix [Clopidogrel Bisulfate] Itching and Rash    History   Social History  . Marital Status: Married    Spouse Name: Britta Mccreedy    Number of Children: 4  . Years of Education: N/A   Occupational History  . Not on file.   Social History Main Topics  . Smoking status: Former Smoker    Quit date: 08/26/1997  . Smokeless tobacco: Never Used  . Alcohol Use: No  . Drug Use: No  . Sexual Activity: Not on file   Other Topics Concern  . Not on file  Social History Narrative  . No narrative on file     Review of Systems: General: negative for chills, fever, night sweats or weight changes.  Cardiovascular: negative for chest pain, dyspnea on exertion, edema, orthopnea, palpitations, paroxysmal nocturnal dyspnea or shortness of breath Dermatological: negative for rash Respiratory: negative for cough or wheezing Urologic: negative for hematuria Abdominal: negative for nausea, vomiting, diarrhea, bright red blood per rectum, melena, or hematemesis Neurologic: negative for visual changes, syncope, or dizziness All other systems  reviewed and are otherwise negative except as noted above.    Blood pressure 110/78, pulse 92, height 5\' 3"  (1.6 m), weight 192 lb (87.091 kg).  General appearance: alert and no distress Neck: no adenopathy, no carotid bruit, no JVD, supple, symmetrical, trachea midline and thyroid not enlarged, symmetric, no tenderness/mass/nodules Lungs: clear to auscultation bilaterally Heart: regular rate and rhythm, S1, S2 normal, no murmur, click, rub or gallop Extremities: extremities normal, atraumatic, no cyanosis or edema and diminished pedal pulses bilaterally  EKG not performed today  ASSESSMENT AND PLAN:   Claudication of left lower extremity, ABI on Lt now  .80 or less down from 1.0 in 01/2013 I performed percutaneous intervention on 01/30/13 with recanalization of a left popliteal chronic total occlusion with overlapping IDEV stents. His post procedure Dopplers were excellent ABI 1. 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  Hypertension Under good control on current medications  Hyperlipidemia On statin therapy followed by his primary cardiologist      Runell Gess MD [facility], [facility] 08/03/2013 10:37 AM

## 2013-08-18 NOTE — CV Procedure (Signed)
Philip Hurst is a 72 y.o. male    [id number] LOCATION:  FACILITY: [facility]  PHYSICIAN: Nanetta Batty, M.D. 1941/08/03   DATE OF PROCEDURE:  08/18/2013  DATE OF DISCHARGE:     PV Angiogram/Intervention    History obtained from chart review.The patient is a 72 year old Caucasian male who is overweight. He has a history of peripheral artery disease and is having symptoms of claudication with a left lower extremity ABI of 0.52 and a right of 0.90. His history also includes hypertension, coronary artery disease, hyperlipidemia. He had a normal cardiac stress test on December 15, 2012 which was nonischemic. He also has dyslipidemia and has remote tobacco abuse but quit 20 years ago. Dr. Allyson Sabal recently performed a lower extremity angiogram which revealed an occluded popliteal artery in the left lower extremity. The right was normal. He also had widely patent renal arteries. He underwent successful balloon angioplasty and stenting of the occluded segment using a chocolate balloon, overlapping IDEV self-expanding stent. The patient now presents today in followup to that procedure. He appears to be doing quite well. He is feeling a little bit under the weather, however, he feels that the left lower extremity claudication symptoms have resolved. He has not done quite as much walking as he used to, but on one of his walks which normally would have caused him problems he did not have any pain. He also denies nausea, vomiting, fever, dizziness, lightheadedness, chest pain, shortness of breath, edema.   The patient was taking Plavix upon discharge from the hospital but developed an allergy to it and was stopped. His aspirin was then increased to 325 mg daily.he had a followup Doppler study performed 02/16/13 revealing a left ABI of 1 with a widely patent popliteal artery.  Over the last several weeks has developed recurrent left calf claudication. Lower extremity arterial Dopplers performed 07/31/13 revealed a  decrease in his left ABI 2.56 with a high-frequency signal in his left popliteal artery suggesting "in-stent restenosis". He will need repeat angiography and intervention.    PROCEDURE DESCRIPTION:   The patient was brought to the second floor [facility] cath lab in the postabsorptive state. He was premedicated with Valium 5 mg by mouth, IV Versed and fentanyl. His right groin was prepped and shaved in usual sterile fashion. Xylocaine 1% was used for local anesthesia. A 6 French sheath was inserted into the right common femoral  artery using standard Seldinger technique. A 5 French crossover catheter and angled catheters were used to obtain contralateral access and performed left lower angiography Using bolus chase digital subtraction step table technique . Omnipaque was used for the entirety of the case. Retrograde aortic pressure was monitored during the case.  HEMODYNAMICS:    AO SYSTOLIC/AO DIASTOLIC: 116/51   Angiographic Data: there was mild segmental disease in the proximal mid left SFA. 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 It was unclear whether this was chronic thrombus or fibrointimal hyperplasia. The anterior tibial artery which was patent at the end of the prior case 6 months ago was essentially occluded with minimal flow as a result of "distal migration" of the previously placed stents over the ostium of the anterior tibial artery   IMPRESSION:in-stent restenosis apparently placed IDEV stent within the popliteal artery caused by either chronic thrombus or fibrointimal hyperplasia . I will proceed with Viabahn covered stenting  to exclude the angiographically documented material.  Procedure Description:the patient received 9500 units of heparin with an ending ACT  of 231. Total contrast administered the patient was 89 cc. The antral catheter was exchanged for a 6 French 45 cm long destination sheath. A 014/300 cm long sport per wire was used  to cross the lesion and a 5 mm x 100 mm long Viabahn  covered stent was then deployed within the previously placed IDEV stent.May 2 in to the stent struts with a good purchase wire in order to recanalize the anterior tibial ostium which was covered by the previous placed stent however this was unsuccessful. Postdilatation was performed with a 4 mm x 100 mm long mustang noncompliant balloon.  Final Impression: successful Viabahn covered stent placement within the previously placed IDEV  stents were put in stent restenosis in the setting of lifestyle limiting claudication. Visit patient is allergic to Plavix I elected to load him with Brilenta. The sheath was then withdrawn across the bifurcation and the wire removed. It was secured in place the patient left the lab in stable condition. The sheath will be removed once the ACT falls below 170 pressure will be held on the groin to achieve hemostasis. The patient will be hydrated, discharged home in the morning and will get followup outpatient arterial lower gemmae Doppler studies in the office after which he will see me back.    Runell Gess MD, [facility] 08/18/2013 5:03 PM

## 2013-08-19 ENCOUNTER — Other Ambulatory Visit: Payer: Self-pay | Admitting: Physician Assistant

## 2013-08-19 DIAGNOSIS — I739 Peripheral vascular disease, unspecified: Secondary | ICD-10-CM | POA: Diagnosis present

## 2013-08-19 DIAGNOSIS — I10 Essential (primary) hypertension: Secondary | ICD-10-CM

## 2013-08-19 DIAGNOSIS — I2510 Atherosclerotic heart disease of native coronary artery without angina pectoris: Secondary | ICD-10-CM

## 2013-08-19 LAB — BASIC METABOLIC PANEL
BUN: 24 mg/dL — ABNORMAL HIGH (ref 6–23)
CO2: 24 mEq/L (ref 19–32)
Calcium: 8.8 mg/dL (ref 8.4–10.5)
Creatinine, Ser: 1.26 mg/dL (ref 0.50–1.35)
GFR calc non Af Amer: 55 mL/min — ABNORMAL LOW (ref >90)
Glucose, Bld: 98 mg/dL (ref 70–99)
Potassium: 4.6 mEq/L (ref 3.5–5.1)
Sodium: 136 mEq/L (ref 135–145)

## 2013-08-19 LAB — CBC
HCT: 29.1 % — ABNORMAL LOW (ref 39.0–52.0)
Hemoglobin: 10.3 g/dL — ABNORMAL LOW (ref 13.0–17.0)
MCH: 31.6 pg (ref 26.0–34.0)
MCHC: 35.4 g/dL (ref 30.0–36.0)
Platelets: 180 10*3/uL (ref 150–400)
RBC: 3.26 MIL/uL — ABNORMAL LOW (ref 4.22–5.81)

## 2013-08-19 MED ORDER — TICAGRELOR 90 MG PO TABS: 90.0000 mg | ORAL_TABLET | Freq: Two times a day (BID) | ORAL | Status: DC

## 2013-08-19 MED ORDER — ASPIRIN 81 MG PO TBEC: 81.0000 mg | DELAYED_RELEASE_TABLET | Freq: Every day | ORAL | Status: DC

## 2013-08-19 NOTE — Discharge Summary (Signed)
Pt seen & examined AM of d/c.   Stable s/p Potliteal A Covered stent.  Ambulated w/o difficulty. See las PN.  Ready for d/c.  Agree with summary.  Marykay Lex, MD

## 2013-08-19 NOTE — Discharge Summary (Signed)
Physician Discharge Summary  Patient ID: VAIDEN ADAMES MRN: [id number] DOB/AGE: Jan 05, 1941 72 y.o.  Admit date: 08/18/2013 Discharge date: 08/19/2013  Admission Diagnoses:  Claudication of left lower extremity  Discharge Diagnoses:  Principal Problem:   PAD (peripheral artery disease) Active Problems:   Claudication of left lower extremity, ABI on Lt now  .80 or less down from 1.0 in 01/2013   Hypertension   Hyperlipidemia   Discharged Condition: stable  Hospital Course:  The patient is a 72 year old Caucasian male who is overweight. He has a history of peripheral artery disease and is having symptoms of claudication with a left lower extremity ABI of 0.52 and a right of 0.90. His history also includes hypertension, coronary artery disease, hyperlipidemia. He had a normal cardiac stress test on December 15, 2012 which was nonischemic. He also has dyslipidemia and has remote tobacco abuse but quit 20 years ago. Dr. Allyson Sabal recently performed a lower extremity angiogram which revealed an occluded popliteal artery in the left lower extremity. The right was normal. He also had widely patent renal arteries. He underwent successful balloon angioplasty and stenting of the occluded segment using a chocolate balloon, overlapping IDEV self-expanding stent. The patient now presents today in followup to that procedure. He appears to be doing quite well. He is feeling a little bit under the weather, however, he feels that the left lower extremity claudication symptoms have resolved. He has not done quite as much walking as he used to, but on one of his walks which normally would have caused him problems he did not have any pain. He also denies nausea, vomiting, fever, dizziness, lightheadedness, chest pain, shortness of breath, edema.   The patient was taking Plavix upon discharge from the hospital but developed an allergy to it and was stopped. His aspirin was then increased to 325 mg daily.he had a followup  Doppler study performed 02/16/13 revealing a left ABI of 1 with a widely patent popliteal artery.  Over the last several weeks has developed recurrent left calf claudication. Lower extremity arterial Dopplers performed 07/31/13 revealed a decrease in his left ABI 2.56 with a high-frequency signal in his left popliteal artery suggesting "in-stent restenosis".   He underwent PV angiogram and had successful Viabahn covered stent placement within the previously placed IDEV stents in the left popliteal artery.  He will have followup outpatient arterial lower extremity Doppler studies in the office in two weeks.  The patient was seen by Dr. Herbie Baltimore who felt he was stable for DC home.   Consults: None  Significant Diagnostic Studies:  PV Angiogram  PROCEDURE DESCRIPTION:  The patient was brought to the second floor [facility] cath lab in the postabsorptive state. He was premedicated with Valium 5 mg by mouth, IV Versed and fentanyl. His right groin  was prepped and shaved in usual sterile fashion. Xylocaine 1% was used for local anesthesia. A 6 French sheath was inserted into the right common femoral  artery using standard Seldinger technique. A 5 French crossover catheter and angled catheters were used to obtain contralateral access and performed left lower angiography Using bolus chase digital subtraction step table technique . Omnipaque was used for the entirety of the case. Retrograde aortic pressure was monitored during the case.  HEMODYNAMICS:  AO SYSTOLIC/AO DIASTOLIC: 116/51  Angiographic Data: there was mild segmental disease in the proximal mid left SFA. 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 It was unclear whether this was chronic thrombus or fibrointimal hyperplasia. The  anterior tibial artery which was patent at the end of the prior case 6 months ago was essentially occluded with minimal flow as a result of "distal migration" of the previously placed  stents over the ostium of the anterior tibial artery  IMPRESSION:in-stent restenosis apparently placed IDEV stent within the popliteal artery caused by either chronic thrombus or fibrointimal hyperplasia . I will proceed with Viabahn covered stenting to exclude the angiographically documented material.  Procedure Description:the patient received 9500 units of heparin with an ending ACT of 231. Total contrast administered the patient was 89 cc. The antral catheter was exchanged for a 6 French 45 cm long destination sheath. A 014/300 cm long sport per wire was used to cross the lesion and a 5 mm x 100 mm long Viabahn covered stent was then deployed within the previously placed IDEV stent.May 2 in to the stent struts with a good purchase wire in order to recanalize the anterior tibial ostium which was covered by the previous placed stent however this was unsuccessful. Postdilatation was performed with a 4 mm x 100 mm long mustang noncompliant balloon.  Final Impression: successful Viabahn covered stent placement within the previously placed IDEV stents were put in stent restenosis in the setting of lifestyle limiting claudication. Visit patient is allergic to Plavix I elected to load him with Brilenta. The sheath was then withdrawn across the bifurcation and the wire removed. It was secured in place the patient left the lab in stable condition. The sheath will be removed once the ACT falls below 170 pressure will be held on the groin to achieve hemostasis. The patient will be hydrated, discharged home in the morning and will get followup outpatient arterial lower gemmae Doppler studies in the office after which he will see me back.  Runell Gess MD, [facility]  08/18/2013  5:03 PM   Treatments: See above  Discharge Exam: Blood pressure 100/70, pulse 92, temperature 98 F (36.7 C), temperature source Oral, resp. rate 18, height 5\' 3"  (1.6 m), weight 193 lb 5.5 oz (87.7 kg), SpO2 96.00%.   Disposition:  01-Home or Self Care      Discharge Orders   Future Appointments Provider Department Dept Phone   09/10/2013 10:15 AM Runell Gess, MD [facility] [phone]   Future Orders Complete By Expires   Diet - low sodium heart healthy  As directed    Discharge instructions  As directed    Comments:     No lifting more than a half gallon of milk or driving for three days.   Increase activity slowly  As directed        Medication List    STOP taking these medications       aspirin 325 MG tablet  Replaced by:  aspirin 81 MG EC tablet      TAKE these medications       aspirin 81 MG EC tablet  Take 1 tablet (81 mg total) by mouth daily.     cilostazol 50 MG tablet  Commonly known as:  PLETAL  Take 50 mg by mouth 2 (two) times daily.     fish oil-omega-3 fatty acids 1000 MG capsule  Take 1 g by mouth 2 (two) times daily.     hydrochlorothiazide 25 MG tablet  Commonly known as:  HYDRODIURIL  Take 25 mg by mouth daily.     HYDROcodone-acetaminophen 10-325 MG per tablet  Commonly known as:  NORCO  Take 1 tablet by mouth every 4 (four) hours  as needed for pain.     ketoconazole 2 % shampoo  Commonly known as:  NIZORAL  Apply 1 application topically 2 (two) times a week.     losartan 100 MG tablet  Commonly known as:  COZAAR  Take 100 mg by mouth daily.     lovastatin 40 MG tablet  Commonly known as:  MEVACOR  Take 40 mg by mouth at bedtime.     Ticagrelor 90 MG Tabs tablet  Commonly known as:  BRILINTA  Take 1 tablet (90 mg total) by mouth 2 (two) times daily.       Follow-up Information   Follow up with Runell Gess, MD On 09/10/2013. (10:15AM)    Specialty:  Cardiology   Contact information:   [address] [place] [address] [phone]       Signed: Wilburt Finlay 08/19/2013, 10:36 AM

## 2013-08-19 NOTE — Progress Notes (Signed)
Subjective: Feels good.  Objective: Vital signs in last 24 hours: Temp:  [97.7 F (36.5 C)-98.2 F (36.8 C)] 98 F (36.7 C) (10/29 0816) Pulse Rate:  [70-104] 92 (10/29 0816) Resp:  [16-19] 18 (10/29 0816) BP: (100-179)/(48-132) 100/70 mmHg (10/29 0816) SpO2:  [94 %-100 %] 96 % (10/29 0816) Weight:  [192 lb (87.091 kg)-193 lb 5.5 oz (87.7 kg)] 193 lb 5.5 oz (87.7 kg) (10/29 0001) Last BM Date: 08/19/13  Intake/Output from previous day: 10/28 0701 - 10/29 0700 In: 240 [P.O.:240] Out: 575 [Urine:575] Intake/Output this shift:    Medications Current Facility-Administered Medications  Medication Dose Route Frequency Provider Last Rate Last Dose  . acetaminophen (TYLENOL) tablet 650 mg  650 mg Oral Q4H PRN Runell Gess, MD      . aspirin EC tablet 325 mg  325 mg Oral Daily Runell Gess, MD   325 mg at 08/19/13 0940  . cilostazol (PLETAL) tablet 50 mg  50 mg Oral BID Runell Gess, MD   50 mg at 08/19/13 0940  . hydrochlorothiazide (HYDRODIURIL) tablet 25 mg  25 mg Oral Daily Runell Gess, MD   25 mg at 08/19/13 0941  . HYDROcodone-acetaminophen (NORCO) 10-325 MG per tablet 1 tablet  1 tablet Oral Q4H PRN Runell Gess, MD   1 tablet at 08/19/13 [phone]  . losartan (COZAAR) tablet 100 mg  100 mg Oral Daily Runell Gess, MD   100 mg at 08/19/13 0941  . morphine 2 MG/ML injection 2 mg  2 mg Intravenous Q1H PRN Runell Gess, MD   2 mg at 08/18/13 1919  . ondansetron (ZOFRAN) injection 4 mg  4 mg Intravenous Q6H PRN Runell Gess, MD   4 mg at 08/18/13 1919  . Ticagrelor (BRILINTA) tablet 90 mg  90 mg Oral BID Runell Gess, MD   90 mg at 08/19/13 0940    PE: General appearance: alert, cooperative and no distress Lungs: clear to auscultation bilaterally Heart: regular rate and rhythm, S1, S2 normal, no murmur, click, rub or gallop Extremities: No LEE Pulses: 2+ and symmetric 1+ DPs and PTs Skin: Small right groin hematoma. Mild ecchymosis.     Neurologic: Grossly normal  Lab Results:   Recent Labs  08/19/13 0350  WBC 6.9  HGB 10.3*  HCT 29.1*  PLT 180   BMET  Recent Labs  08/19/13 0350  NA 136  K 4.6  CL 100  CO2 24  GLUCOSE 98  BUN 24*  CREATININE 1.26  CALCIUM 8.8    Assessment/Plan    Principal Problem:   PAD (peripheral artery disease) Active Problems:   Claudication of left lower extremity, ABI on Lt now  .80 or less down from 1.0 in 01/2013   Hypertension   Hyperlipidemia  Plan:  SP successful Viabahn covered stent placement within the previously placed IDEV stents in the left popliteal artery.  Follow up LEA dopplers in two weeks.  DC home today.    LOS: 1 day    HAGER, BRYAN 08/19/2013 10:04 AM  I have seen and evaluated the patient this AM along with Wilburt Finlay, PA. I agree with his findings, examination as well as impression recommendations.  Looks good this AM s/p PTA-STent of L Popliteal A ISR with Viabahn covered stent.    On Brilinta (due to Plavix Allergy). S Tachy on monitor - ? If he could benefit from BB on d/c. Would also need to consider Rx of hyperlipidemia.  Continue home meds - ROV as scheduled.   Marykay Lex, M.D., M.S. [facility] [address] [place], [place]  [address]  [phone] Pager # [phone] 08/19/2013 10:16 AM

## 2013-08-19 NOTE — Progress Notes (Signed)
Utilization Review Completed.Philip Hurst T10/29/2014  

## 2013-09-01 ENCOUNTER — Encounter (HOSPITAL_COMMUNITY): Payer: Medicare Other

## 2013-09-04 ENCOUNTER — Ambulatory Visit (HOSPITAL_COMMUNITY)
Admission: RE | Admit: 2013-09-04 | Discharge: 2013-09-04 | Disposition: A | Discharge status: Home / Self Care | Payer: Medicare Other | Source: Ambulatory Visit | Attending: Cardiovascular Disease | Admitting: Cardiovascular Disease

## 2013-09-04 ENCOUNTER — Other Ambulatory Visit (HOSPITAL_COMMUNITY): Payer: Self-pay | Admitting: Cardiovascular Disease

## 2013-09-04 DIAGNOSIS — I739 Peripheral vascular disease, unspecified: Secondary | ICD-10-CM

## 2013-09-04 NOTE — Progress Notes (Signed)
Lower Extremity Arterial Duplex Completed. °[name],RVT °

## 2013-09-10 ENCOUNTER — Encounter: Payer: Self-pay | Admitting: Cardiovascular Disease

## 2013-09-10 ENCOUNTER — Ambulatory Visit (INDEPENDENT_AMBULATORY_CARE_PROVIDER_SITE_OTHER): Payer: Medicare Other | Admitting: Cardiovascular Disease

## 2013-09-10 VITALS — BP 153/92 | HR 118 | Ht 63.0 in | Wt 190.0 lb

## 2013-09-10 DIAGNOSIS — E785 Hyperlipidemia, unspecified: Secondary | ICD-10-CM

## 2013-09-10 DIAGNOSIS — I10 Essential (primary) hypertension: Secondary | ICD-10-CM

## 2013-09-10 DIAGNOSIS — I739 Peripheral vascular disease, unspecified: Secondary | ICD-10-CM

## 2013-09-10 NOTE — Progress Notes (Signed)
09/10/2013 Philip Hurst   [birth year]  [id number]  Primary Physician Kaleen Mask, MD Primary Cardiologist: Runell Gess MD Roseanne Reno   HPI:  The patient is a 72 year old Caucasian male who is overweight. He has a history of peripheral artery disease and is having symptoms of claudication with a left lower extremity ABI of 0.52 and a right of 0.90. His history also includes hypertension, coronary artery disease, hyperlipidemia. He had a normal cardiac stress test on December 15, 2012 which was nonischemic. He also has dyslipidemia and has remote tobacco abuse but quit 20 years ago. Dr. Allyson Sabal recently performed a lower extremity angiogram which revealed an occluded popliteal artery in the left lower extremity. The right was normal. He also had widely patent renal arteries. He underwent successful balloon angioplasty and stenting of the occluded segment using a chocolate balloon, overlapping IDEV self-expanding stent. The patient now presents today in followup to that procedure. He appears to be doing quite well. He is feeling a little bit under the weather, however, he feels that the left lower extremity claudication symptoms have resolved. He has not done quite as much walking as he used to, but on one of his walks which normally would have caused him problems he did not have any pain. He also denies nausea, vomiting, fever, dizziness, lightheadedness, chest pain, shortness of breath, edema.   The patient was taking Plavix upon discharge from the hospital but developed an allergy to it and was stopped. His aspirin was then increased to 325 mg daily.he had a followup Doppler study performed 02/16/13 revealing a left ABI of 1 with a widely patent popliteal artery.  Over the last several weeks has developed recurrent left calf claudication. Lower extremity arterial Dopplers performed 07/31/13 revealed a decrease in his left ABI 2.56 with a high-frequency signal in his left  popliteal artery suggesting "in-stent restenosis"  I performed repeat angiography 08/18/13 revealing "in-stent restenosis and quit within the previously placed popliteal artery stent for a chronic total occlusion. I re\re intervened and restented with a viable on covered stent with an excellent intravascular result. Followup Dopplers performed later revealed an increase in his left ABI 0.98. No other has claudication.  Current Outpatient Prescriptions  Medication Sig Dispense Refill  . aspirin EC 81 MG EC tablet Take 1 tablet (81 mg total) by mouth daily.  30 tablet  0  . cilostazol (PLETAL) 50 MG tablet Take 50 mg by mouth 2 (two) times daily.      . fish oil-omega-3 fatty acids 1000 MG capsule Take 1 g by mouth 2 (two) times daily.       . hydrochlorothiazide (HYDRODIURIL) 25 MG tablet Take 25 mg by mouth daily.      Marland Kitchen HYDROcodone-acetaminophen (NORCO) 10-325 MG per tablet Take 1 tablet by mouth every 4 (four) hours as needed for pain.       Marland Kitchen ketoconazole (NIZORAL) 2 % shampoo Apply 1 application topically 2 (two) times a week.      . losartan (COZAAR) 100 MG tablet Take 100 mg by mouth daily.      Marland Kitchen lovastatin (MEVACOR) 40 MG tablet Take 40 mg by mouth at bedtime.        . Ticagrelor (BRILINTA) 90 MG TABS tablet Take 1 tablet (90 mg total) by mouth 2 (two) times daily.  60 tablet  10   No current facility-administered medications for this visit.    Allergies  Allergen Reactions  . Plavix [Clopidogrel Bisulfate] Itching  and Rash    History   Social History  . Marital Status: Married    Spouse Name: Britta Mccreedy    Number of Children: 4  . Years of Education: N/A   Occupational History  . Not on file.   Social History Main Topics  . Smoking status: Former Smoker -- 1.00 packs/day for 44 years    Types: Cigarettes    Quit date: 08/26/1997  . Smokeless tobacco: Current User    Types: Chew  . Alcohol Use: Yes     Comment: 08/18/2013 "glass of wine 5-6 months ago; very rare"  .  Drug Use: No  . Sexual Activity: No   Other Topics Concern  . Not on file   Social History Narrative  . No narrative on file     Review of Systems: General: negative for chills, fever, night sweats or weight changes.  Cardiovascular: negative for chest pain, dyspnea on exertion, edema, orthopnea, palpitations, paroxysmal nocturnal dyspnea or shortness of breath Dermatological: negative for rash Respiratory: negative for cough or wheezing Urologic: negative for hematuria Abdominal: negative for nausea, vomiting, diarrhea, bright red blood per rectum, melena, or hematemesis Neurologic: negative for visual changes, syncope, or dizziness All other systems reviewed and are otherwise negative except as noted above.    Blood pressure 153/92, pulse 118, height 5\' 3"  (1.6 m), weight 190 lb (86.183 kg).  General appearance: alert and no distress Neck: no adenopathy, no carotid bruit, no JVD, supple, symmetrical, trachea midline and thyroid not enlarged, symmetric, no tenderness/mass/nodules Lungs: clear to auscultation bilaterally Heart: regular rate and rhythm, S1, S2 normal, no murmur, click, rub or gallop Extremities: 1+ left pedal pulses. His right femoral arterial puncture site well-healed  EKG not performed today  ASSESSMENT AND PLAN:   PAD (peripheral artery disease) Patient had prior IDEV  stent placement in a popliteal chronic total occlusion with excellent initial angiographic and clinical result.because of recurrent symptoms and Doppler study which suggested "in-stent restenosis he was reintubated and on 08/18/13 confirming this. I performed re\re vascularization with a Vitatron covered stent. There was distal migration of the previously placed stent covering the anterior tibial artery. Followup Dopplers performed 09/04/13 revealed an increase in the left ABI of 0.90. His claudication has resolved. He is on Brilenta as well as aspirin because of Plavix  allergy.  Hyperlipidemia On statin therapy followed by his PCP  Hypertension Controlled on current medications      Runell Gess MD [facility], [facility] 09/10/2013 10:46 AM

## 2013-09-10 NOTE — Assessment & Plan Note (Signed)
Controlled on current medications 

## 2013-09-10 NOTE — Assessment & Plan Note (Signed)
Patient had prior IDEV  stent placement in a popliteal chronic total occlusion with excellent initial angiographic and clinical result.because of recurrent symptoms and Doppler study which suggested "in-stent restenosis he was reintubated and on 08/18/13 confirming this. I performed re\re vascularization with a Vitatron covered stent. There was distal migration of the previously placed stent covering the anterior tibial artery. Followup Dopplers performed 09/04/13 revealed an increase in the left ABI of 0.90. His claudication has resolved. He is on Brilenta as well as aspirin because of Plavix allergy.

## 2013-09-10 NOTE — Assessment & Plan Note (Signed)
On statin therapy followed by his PCP 

## 2013-09-10 NOTE — Patient Instructions (Signed)
Your physician recommends that you schedule a follow-up appointment in: 6 months  Your physician has requested that you have a lower extremity arterial duplex. This test is an ultrasound of the arteries in the legs. It looks at arterial blood flow in the legs. Allow one hour for Lower Arterial scans. There are no restrictions or special instructions In 6 Months

## 2014-02-24 ENCOUNTER — Telehealth (HOSPITAL_COMMUNITY): Payer: Self-pay | Admitting: *Deleted

## 2014-02-25 ENCOUNTER — Telehealth (HOSPITAL_COMMUNITY): Payer: Self-pay | Admitting: *Deleted

## 2014-04-13 ENCOUNTER — Other Ambulatory Visit: Payer: Self-pay | Admitting: Cardiology

## 2014-04-13 NOTE — Telephone Encounter (Signed)
Rx was sent to pharmacy electronically. 

## 2014-08-26 ENCOUNTER — Other Ambulatory Visit: Payer: Self-pay | Admitting: Physician Assistant

## 2014-08-26 NOTE — Telephone Encounter (Signed)
Patient needs an appt for future refills

## 2014-09-01 ENCOUNTER — Telehealth: Payer: Self-pay | Admitting: Cardiovascular Disease

## 2014-09-01 NOTE — Telephone Encounter (Signed)
Closed encounter °

## 2014-09-30 ENCOUNTER — Encounter (HOSPITAL_COMMUNITY): Payer: Self-pay | Admitting: Cardiovascular Disease

## 2014-10-27 ENCOUNTER — Encounter: Payer: Self-pay | Admitting: Cardiovascular Disease

## 2014-10-27 ENCOUNTER — Ambulatory Visit (INDEPENDENT_AMBULATORY_CARE_PROVIDER_SITE_OTHER): Payer: Medicare Other | Admitting: Cardiovascular Disease

## 2014-10-27 VITALS — BP 128/75 | HR 79 | Ht 63.0 in | Wt 203.2 lb

## 2014-10-27 DIAGNOSIS — I739 Peripheral vascular disease, unspecified: Secondary | ICD-10-CM

## 2014-10-27 DIAGNOSIS — I2510 Atherosclerotic heart disease of native coronary artery without angina pectoris: Secondary | ICD-10-CM

## 2014-10-27 DIAGNOSIS — E785 Hyperlipidemia, unspecified: Secondary | ICD-10-CM

## 2014-10-27 DIAGNOSIS — I10 Essential (primary) hypertension: Secondary | ICD-10-CM

## 2014-10-27 NOTE — Assessment & Plan Note (Signed)
History of single-vessel CAD with collaterals. The patient is asymptomatic

## 2014-10-27 NOTE — Assessment & Plan Note (Signed)
History of hyperlipidemia on Mevacor. This is followed by his primary care physician

## 2014-10-27 NOTE — Progress Notes (Signed)
10/27/2014 Philip Hurst   July 12, 1941  [id number]  Primary Physician Leonard Downing, MD Primary Cardiologist: Lorretta Harp MD [name]   HPI:  The patient is a 74 year old Caucasian male who is overweight. He has a history of peripheral artery disease and is having symptoms of claudication with a left lower extremity ABI of 0.52 and a right of 0.90. His history also includes hypertension, coronary artery disease, hyperlipidemia. He had a normal cardiac stress test on December 15, 2012 which was nonischemic. He also has dyslipidemia and has remote tobacco abuse but quit 20 years ago. Dr. Gwenlyn Found recently performed a lower extremity angiogram which revealed an occluded popliteal artery in the left lower extremity. The right was normal. He also had widely patent renal arteries. He underwent successful balloon angioplasty and stenting of the occluded segment using a chocolate balloon, overlapping IDEV self-expanding stent.  The patient was taking Plavix upon discharge from the hospital but developed an allergy to it and was stopped. His aspirin was then increased to 325 mg daily.he had a followup Doppler study performed 02/16/13 revealing a left ABI of 1 with a widely patent popliteal artery.  Because of  recurrent left calf claudication. Lower extremity arterial Dopplers performed 07/31/13 revealed a decrease in his left ABI  56 with a high-frequency signal in his left popliteal artery suggesting "in-stent restenosis"  I performed repeat angiography 08/18/13 revealing "in-stent restenosis and quit within the previously placed popliteal artery stent for a chronic total occlusion. I re\re intervened and restented with a Viabahn  covered stent with an excellent angiographic and clinical reslt.result. Followup Dopplers performed later revealed an increase in his left ABI 0.98. Since I saw him a year ago he has had no further claudication. He also denies chest pain or shortness of  breath. He does complain of hip and knee pain which sounds arthritic.   Current Outpatient Prescriptions  Medication Sig Dispense Refill  . aspirin EC 81 MG EC tablet Take 1 tablet (81 mg total) by mouth daily. 30 tablet 0  . cilostazol (PLETAL) 50 MG tablet TAKE ONE TABLET BY MOUTH TWICE DAILY 60 tablet 5  . fish oil-omega-3 fatty acids 1000 MG capsule Take 1 g by mouth 2 (two) times daily.     . hydrochlorothiazide (HYDRODIURIL) 25 MG tablet Take 25 mg by mouth daily.    Marland Kitchen HYDROcodone-acetaminophen (NORCO) 10-325 MG per tablet Take 1 tablet by mouth every 4 (four) hours as needed for pain.     Marland Kitchen ketoconazole (NIZORAL) 2 % shampoo Apply 1 application topically 2 (two) times a week.    . losartan (COZAAR) 100 MG tablet Take 100 mg by mouth daily.    Marland Kitchen lovastatin (MEVACOR) 40 MG tablet Take 40 mg by mouth at bedtime.      . Ticagrelor (BRILINTA) 90 MG TABS tablet Take 1 tablet (90 mg total) by mouth 2 (two) times daily. 60 tablet 10   No current facility-administered medications for this visit.    Allergies  Allergen Reactions  . Plavix [Clopidogrel Bisulfate] Itching and Rash    History   Social History  . Marital Status: Married    Spouse Name: Philip Hurst    Number of Children: 4  . Years of Education: N/A   Occupational History  . Not on file.   Social History Main Topics  . Smoking status: Former Smoker -- 1.00 packs/day for 44 years    Types: Cigarettes    Quit date: 08/26/1997  .  Smokeless tobacco: Current User    Types: Chew  . Alcohol Use: Yes     Comment: 08/18/2013 "glass of wine 5-6 months ago; very rare"  . Drug Use: No  . Sexual Activity: No   Other Topics Concern  . Not on file   Social History Narrative     Review of Systems: General: negative for chills, fever, night sweats or weight changes.  Cardiovascular: negative for chest pain, dyspnea on exertion, edema, orthopnea, palpitations, paroxysmal nocturnal dyspnea or shortness of  breath Dermatological: negative for rash Respiratory: negative for cough or wheezing Urologic: negative for hematuria Abdominal: negative for nausea, vomiting, diarrhea, bright red blood per rectum, melena, or hematemesis Neurologic: negative for visual changes, syncope, or dizziness All other systems reviewed and are otherwise negative except as noted above.    Blood pressure 128/75, pulse 79, height 5\' 3"  (1.6 m), weight 203 lb 3.2 oz (92.171 kg).  General appearance: alert and no distress Neck: no adenopathy, no carotid bruit, no JVD, supple, symmetrical, trachea midline and thyroid not enlarged, symmetric, no tenderness/mass/nodules Lungs: clear to auscultation bilaterally Heart: regular rate and rhythm, S1, S2 normal, no murmur, click, rub or gallop Extremities: extremities normal, atraumatic, no cyanosis or edema and 2+ left pedal pulse  EKG normal sinus rhythm at 79 without ST or T-wave changes. I personally reviewed this EKG  ASSESSMENT AND PLAN:   Claudication of left lower extremity, ABI on Lt now  .80 or less down from 1.0 in 01/2013 History of peripheral arterial disease status post intervention on a totally occluded left popliteal artery using ID EV self-expanding stents. This ultimately restenosed and he underwent subsequent re-intervention 08/18/13 with a Viabahn  covered stent. This remains open clinically. His post suture Dopplers performed 09/04/13 revealed widely patent popliteal artery on the left with an ABI 0.98.I'm going to repeat lower extremity arterial Doppler studies  CAD (coronary artery disease), single vessel disease with collaterals, medical therapy History of single-vessel CAD with collaterals. The patient is asymptomatic  Hypertension History of hypertension with blood pressure measured today at 128/75. He is on losartan 100 mg a day post-thyroid 0.5 mg a day. Continue current meds at current dosing  Hyperlipidemia History of hyperlipidemia on Mevacor.  This is followed by his primary care physician      Lorretta Harp MD [facility], [facility] 10/27/2014 2:40 PM

## 2014-10-27 NOTE — Assessment & Plan Note (Signed)
History of peripheral arterial disease status post intervention on a totally occluded left popliteal artery using ID EV self-expanding stents. This ultimately restenosed and he underwent subsequent re-intervention 08/18/13 with a Viabahn  covered stent. This remains open clinically. His post suture Dopplers performed 09/04/13 revealed widely patent popliteal artery on the left with an ABI 0.98.I'm going to repeat lower extremity arterial Doppler studies

## 2014-10-27 NOTE — Assessment & Plan Note (Signed)
History of hypertension with blood pressure measured today at 128/75. He is on losartan 100 mg a day post-thyroid 0.5 mg a day. Continue current meds at current dosing

## 2014-10-27 NOTE — Patient Instructions (Signed)
Dr Gwenlyn Found has ordered the following test(s) to be done: 1. Lower Extremity Arterial Dopplers - This test is an ultrasound of the arteries in the legs. It looks at arterial blood flow in the legs. Allow one hour for Lower Arterial scans. There are no restrictions or special instructions.  Your physician has recommended making the following medication changes: STOP Pletal  Dr Gwenlyn Found wants you to follow-up in 1 year. You will receive a reminder letter in the mail one months in advance. If you don't receive a letter, please call our office to schedule the follow-up appointment.

## 2014-11-04 ENCOUNTER — Ambulatory Visit (HOSPITAL_COMMUNITY)
Admission: RE | Admit: 2014-11-04 | Discharge: 2014-11-04 | Disposition: A | Discharge status: Home / Self Care | Payer: Medicare Other | Source: Ambulatory Visit | Attending: Cardiology | Admitting: Cardiology

## 2014-11-04 DIAGNOSIS — I739 Peripheral vascular disease, unspecified: Secondary | ICD-10-CM | POA: Diagnosis not present

## 2014-11-04 NOTE — Progress Notes (Signed)
Arterial Lower Ext. Duplex Completed. Lynea Rollison, BS, RDMS, RVT  

## 2014-11-08 ENCOUNTER — Encounter: Payer: Self-pay | Admitting: *Deleted

## 2015-01-03 ENCOUNTER — Other Ambulatory Visit: Payer: Self-pay | Admitting: Cardiovascular Disease

## 2015-01-03 NOTE — Telephone Encounter (Signed)
Rx refill sent to patient pharmacy   

## 2015-08-12 ENCOUNTER — Telehealth: Payer: Self-pay | Admitting: Internal Medicine

## 2015-08-12 NOTE — Telephone Encounter (Signed)
Called by Dr. Arelia Sneddon regarding Philip Hurst. He was apparently seen in his office complaining of left thigh pain. On exam his left foot is somewhat cool and there is concern about arterial insufficiency. His symptoms are worse with exertion but he is not complaining of rest pain. He does have a history of significant PAD. I will route the message to Dr. Gwenlyn Found as he may need a repeat Dopplers or more urgent evaluation next week.  Pixie Casino, MD, [facility] Attending Cardiologist [place]

## 2015-08-13 NOTE — Telephone Encounter (Signed)
Needs repeat dopplers Monday or Tuesday then ROV with me on Wednesday

## 2015-08-15 NOTE — Telephone Encounter (Signed)
Left message for patient to call back. Pt needs orders and to be set up for LEA for leg pain.

## 2015-08-16 ENCOUNTER — Telehealth: Payer: Self-pay | Admitting: Cardiovascular Disease

## 2015-08-16 NOTE — Telephone Encounter (Signed)
Returning your call. °

## 2015-08-16 NOTE — Telephone Encounter (Signed)
Called pt back but no answer.  If pt calls back he needs to be setup for LEA appt ASAP and f/u with Gwenlyn Found next day or very shortly after.

## 2015-08-25 ENCOUNTER — Other Ambulatory Visit: Payer: Self-pay | Admitting: Cardiovascular Disease

## 2015-08-25 DIAGNOSIS — I739 Peripheral vascular disease, unspecified: Secondary | ICD-10-CM

## 2015-08-25 NOTE — Telephone Encounter (Signed)
Called pt. Pt scheduled for LEA's on 11/7 @ 12:30p and pt has appt with Dr. Gwenlyn Found to review results on 11/16. Pt aware of both appts. No questions at this time.

## 2015-08-25 NOTE — Telephone Encounter (Signed)
Called pt.  Pt scheduled for LEA's on 11/7 @ 12:30p and pt has appt with Dr. Gwenlyn Found to review results on 11/16.  Pt aware of both appts. No questions at this time.

## 2015-08-29 ENCOUNTER — Ambulatory Visit (HOSPITAL_COMMUNITY)
Admission: RE | Admit: 2015-08-29 | Discharge: 2015-08-29 | Disposition: A | Discharge status: Home / Self Care | Payer: Medicare Other | Source: Ambulatory Visit | Attending: Cardiovascular Disease | Admitting: Cardiovascular Disease

## 2015-08-29 ENCOUNTER — Encounter: Payer: Self-pay | Admitting: Physician Assistant

## 2015-08-29 ENCOUNTER — Ambulatory Visit (INDEPENDENT_AMBULATORY_CARE_PROVIDER_SITE_OTHER): Payer: Medicare Other | Admitting: Physician Assistant

## 2015-08-29 VITALS — BP 128/68 | HR 116 | Ht 63.0 in | Wt 193.0 lb

## 2015-08-29 DIAGNOSIS — I739 Peripheral vascular disease, unspecified: Secondary | ICD-10-CM | POA: Insufficient documentation

## 2015-08-29 DIAGNOSIS — R5381 Other malaise: Secondary | ICD-10-CM

## 2015-08-29 DIAGNOSIS — I70229 Atherosclerosis of native arteries of extremities with rest pain, unspecified extremity: Secondary | ICD-10-CM

## 2015-08-29 DIAGNOSIS — I998 Other disorder of circulatory system: Secondary | ICD-10-CM | POA: Diagnosis not present

## 2015-08-29 DIAGNOSIS — Z87891 Personal history of nicotine dependence: Secondary | ICD-10-CM | POA: Diagnosis not present

## 2015-08-29 DIAGNOSIS — Y838 Other surgical procedures as the cause of abnormal reaction of the patient, or of later complication, without mention of misadventure at the time of the procedure: Secondary | ICD-10-CM | POA: Insufficient documentation

## 2015-08-29 DIAGNOSIS — D689 Coagulation defect, unspecified: Secondary | ICD-10-CM

## 2015-08-29 DIAGNOSIS — I771 Stricture of artery: Secondary | ICD-10-CM | POA: Diagnosis not present

## 2015-08-29 DIAGNOSIS — Z01818 Encounter for other preprocedural examination: Secondary | ICD-10-CM

## 2015-08-29 DIAGNOSIS — T82598A Other mechanical complication of other cardiac and vascular devices and implants, initial encounter: Secondary | ICD-10-CM | POA: Diagnosis not present

## 2015-08-29 DIAGNOSIS — R5383 Other fatigue: Secondary | ICD-10-CM

## 2015-08-29 NOTE — Progress Notes (Signed)
Cardiology Office Note   Date:  08/29/2015   ID:  [name], [name] 1940-10-24, MRN [id number]  PCP:  Leonard Downing, MD  Cardiologist:  Dr Stacy Gardner, PA-C   Chief Complaint  Patient presents with  . Follow-up    pt c/o no chest pain no SOB no dizziness, and no swelling legs    History of Present Illness: Philip Hurst is a 74 y.o. male with a history of PAD, HTN, CAD (RCA 100%), HL. He had leg pain and ABIs showed severed disease.   Philip Hurst presents for evaluation of PAD.  Philip Hurst has a long history of PAD. Over the last several weeks, he has noticed increasing pain with ambulation. He was evaluated by Dr. Arelia Sneddon and scheduled for ABIs. These were performed today and showed a significant decrease in left lower extremity circulation with an ABI 0.32 on preliminary reports.  Philip Hurst states that 4 weeks he has been noticing increasing pain with ambulation. It will start shortly after he starts walking up a hill. He can walk a longer distance on flat ground without discomfort but he will still get pain in less than 200 feet.   The pain is a cramping and tightness. It is in his thigh and lower leg. In about 200 feet, the pain will be so severe that he has to stop. He will also get severe lower extremity pain and about 10 minutes just standing still. If he is sitting and resting, he does not get much pain.   He has decreased sensation in his left foot as well. The foot is cooler than his right foot. Wounds have been slower to heal, but they have healed. He has only one small scratch that is fairly recent and is not infected.   Past Medical History  Diagnosis Date  . Hypertension   . Hyperlipidemia   . Coronary artery disease     OCCLUDED RCA  . Claudication of left lower extremity, ABI Lt 0.52 -lifestyle limiting  ABI rt 0.90 01/30/2013  . Normal cardiac stress test, No ischemia 12/15/12 01/30/2013  . CAD (coronary artery disease), single vessel disease with  collaterals, medical therapy 09/12/2011  . PAD (peripheral artery disease)   . Arthritis     "back, joints in right hand" (08/18/2013)  . Chronic lower back pain     Past Surgical History  Procedure Laterality Date  . Cardiovascular stress test  12/07/2008    EF 58%, NO ISCHEMIA  . Popliteal artery stent Left 01/30/13  . Cardiac catheterization  @2002     1 vessel disease w/collaterals,medical therapy  . Back surgery    . 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 Lower extremities arterial doppler  02/16/2013    bilateral ABIs normal values w/o evidence arterial insuff. at rest; left  lower extrem. norm. left popliteal artery stent norm. patency  . Popliteal artery stent Left 10/'28/2014    successful Viabahn covered stent placement within the previously placed IDEV  stents/notes 08/18/2013  . Tonsillectomy    . Wrist fusion Right 1990's?    "first OR didn't correct problem" (08/18/2013)  . Lumbar disc surgery  1990's    "herniated disc" (08/18/2013)  . Posterior lumbar fusion  1990's  . Cataract extraction w/ intraocular lens  implant, bilateral Bilateral   . Percutaneous stent intervention  01/30/2013    Procedure: PERCUTANEOUS STENT INTERVENTION;  Surgeon: Lorretta Harp, MD;  Location: [facility] CATH  LAB;  Service: Cardiovascular;;  . Lower extremity angiogram Left 08/18/2013    Procedure: LOWER EXTREMITY ANGIOGRAM;  Surgeon: Lorretta Harp, MD;  Location: [facility] CATH LAB;  Service: Cardiovascular;  Laterality: Left;    Current Outpatient Prescriptions  Medication Sig Dispense Refill  . aspirin EC 81 MG EC tablet Take 1 tablet (81 mg total) by mouth daily. 30 tablet 0  . atorvastatin (LIPITOR) 10 MG tablet Take 10 mg by mouth daily.  0  . BRILINTA 90 MG TABS tablet TAKE 1 TABLET BY MOUTH TWICE DAILY 60 tablet 10  . cilostazol (PLETAL) 50 MG tablet TAKE ONE TABLET BY MOUTH TWICE DAILY 60 tablet 5  . fish oil-omega-3 fatty acids 1000 MG capsule Take 1 g by mouth 2  (two) times daily.     . hydrochlorothiazide (HYDRODIURIL) 25 MG tablet Take 25 mg by mouth daily.    Marland Kitchen HYDROcodone-acetaminophen (NORCO) 10-325 MG per tablet Take 1 tablet by mouth every 4 (four) hours as needed for pain.     Marland Kitchen ketoconazole (NIZORAL) 2 % shampoo Apply 1 application topically 2 (two) times a week.    . losartan (COZAAR) 100 MG tablet Take 100 mg by mouth daily.    Marland Kitchen lovastatin (MEVACOR) 40 MG tablet Take 40 mg by mouth at bedtime.       No current facility-administered medications for this visit.    Allergies:   Plavix    Social History:  The patient  reports that he quit smoking about 18 years ago. His smoking use included Cigarettes. He has a 44 pack-year smoking history. His smokeless tobacco use includes Chew. He reports that he drinks alcohol. He reports that he does not use illicit drugs.   Family History:  The patient's family history includes Cancer in his father; Coronary artery disease in his brother; Diabetes in his brother; Seizures in his mother.    ROS:  Please see the history of present illness. All other systems are reviewed and negative.    PHYSICAL EXAM: VS:  BP 128/68 mmHg  Pulse 116  Ht 5\' 3"  (1.6 m)  Wt 193 lb (87.544 kg)  BMI 34.20 kg/m2 , BMI Body mass index is 34.2 kg/(m^2). GEN: Well nourished, well developed, male in no acute distress HEENT: normal for age  Neck: no JVD, no carotid bruit, no masses Cardiac: RRR; no murmur, no rubs, or gallops Respiratory:  clear to auscultation bilaterally, normal work of breathing GI: soft, nontender, nondistended, + BS MS: no deformity or atrophy; no edema; distal pulses are 2+ both upper extremities. Bilateral femoral bruits are noted. 2+ R DP, decreased R PT, L PT not palpable, left DP barely palpable. Left foot is cool to touch and capillary refill in some areas is 10 seconds.  Skin: warm and dry, no rash Neuro:  Strength and sensation are intact Psych: euthymic mood, full affect   EKG:  EKG is  not ordered today.  Recent Labs: No results found for requested labs within last 365 days.    Lipid Panel No results found for: CHOL, TRIG, HDL, CHOLHDL, VLDL, LDLCALC, LDLDIRECT   Wt Readings from Last 3 Encounters:  08/29/15 193 lb (87.544 kg)  10/27/14 203 lb 3.2 oz (92.171 kg)  09/10/13 190 lb (86.183 kg)     Other studies Reviewed: Additional studies/ records that were reviewed today include: Previous office notes, phone notes, vascular studies.  ASSESSMENT AND PLAN:  1.  Critical limb ischemia: Philip Hurst is not having severe resting pain. He  has decreased sensation in his left foot but it is not completely numb. Contacted Dr. Gwenlyn Found, and he will need a lower extremity angiogram early next week. He will get preprocedure labs and a chest x-ray on Wednesday. He was anemic on the last labs we have in the system and he also has had some mild renal insufficiency as well. If he needs to come in early for preprocedure hydration, we will arrange this.  2. CAD: He has no ongoing ischemic symptoms, continue current therapy.  3. Hypertension: His blood pressure is well controlled on current therapy  4. Hyperlipidemia: He is to continue fish oil and Mevacor. We will check a profile and LFTs when he comes in for the procedure.  Current medicines are reviewed at length with the patient today.  The patient does not have concerns regarding medicines.  The following changes have been made:  no change  Labs/ tests ordered today include:   Orders Placed This Encounter  Procedures  . DG Chest 2 View  . CBC  . Basic metabolic panel  . Protime-INR  . APTT  . TSH  . LOWER EXTREMITY ANGIOGRAM   Disposition:   FU with Dr. Gwenlyn Found after the procedure  Signed, Lenoard Aden  08/29/2015 5:36 PM    [facility], [place], [place]  [address] Phone: [phone]; Fax: [phone]

## 2015-08-29 NOTE — Patient Instructions (Addendum)
Rosaria Ferries, PA-C, has ordered a peripheral angiogram to be done at [facility] on Monday, November 14th, 2016 with Dr Gwenlyn Found.  This procedure is going to look at the bloodflow in your lower extremities.  If Dr. Gwenlyn Found is able to open up the arteries, you will have to spend one night in the hospital.  If he is not able to open the arteries, you will be able to go home that same day. We will have to call tomorrow to schedule this.  After the procedure, you will not be allowed to drive for 3 days or push, pull, or lift anything greater than 10 lbs for one week.    You will be required to have the following tests prior to the procedure:  1. Blood work-the blood work can be done no more than 7 days prior to the procedure.  It can be done at any [facility] lab.  There is one downstairs on the first floor of this building and one in the [facility] building at [address], [address]  2. Chest Xray-the chest xray order has already been placed at [place] in the [facility] [place].

## 2015-08-30 ENCOUNTER — Encounter: Payer: Self-pay | Admitting: Cardiovascular Disease

## 2015-08-31 ENCOUNTER — Ambulatory Visit
Admission: RE | Admit: 2015-08-31 | Discharge: 2015-08-31 | Disposition: A | Discharge status: Home / Self Care | Payer: Medicare Other | Source: Ambulatory Visit | Attending: Physician Assistant | Admitting: Physician Assistant

## 2015-08-31 DIAGNOSIS — Z01818 Encounter for other preprocedural examination: Secondary | ICD-10-CM

## 2015-08-31 DIAGNOSIS — I70229 Atherosclerosis of native arteries of extremities with rest pain, unspecified extremity: Secondary | ICD-10-CM

## 2015-08-31 DIAGNOSIS — Z87891 Personal history of nicotine dependence: Secondary | ICD-10-CM

## 2015-08-31 DIAGNOSIS — I998 Other disorder of circulatory system: Secondary | ICD-10-CM

## 2015-08-31 LAB — CBC
HCT: 33.5 % — ABNORMAL LOW (ref 39.0–52.0)
Hemoglobin: 11.3 g/dL — ABNORMAL LOW (ref 13.0–17.0)
MCH: 30.1 pg (ref 26.0–34.0)
MCHC: 33.7 g/dL (ref 30.0–36.0)
MCV: 89.1 fL (ref 78.0–100.0)
MPV: 10.5 fL (ref 8.6–12.4)
PLATELETS: 307 10*3/uL (ref 150–400)
RBC: 3.76 MIL/uL — AB (ref 4.22–5.81)
RDW: 14 % (ref 11.5–15.5)
WBC: 8.6 10*3/uL (ref 4.0–10.5)

## 2015-08-31 LAB — TSH: TSH: 1.393 u[IU]/mL (ref 0.350–4.500)

## 2015-09-01 LAB — BASIC METABOLIC PANEL
BUN: 29 mg/dL — ABNORMAL HIGH (ref 7–25)
CHLORIDE: 99 mmol/L (ref 98–110)
CO2: 27 mmol/L (ref 20–31)
Calcium: 9.3 mg/dL (ref 8.6–10.3)
Creat: 1.34 mg/dL — ABNORMAL HIGH (ref 0.70–1.18)
Glucose, Bld: 112 mg/dL — ABNORMAL HIGH (ref 65–99)
Potassium: 3.9 mmol/L (ref 3.5–5.3)
SODIUM: 140 mmol/L (ref 135–146)

## 2015-09-01 LAB — PROTIME-INR
INR: 1.02 (ref <1.50)
Prothrombin Time: 13.5 seconds (ref 11.6–15.2)

## 2015-09-01 LAB — APTT: APTT: 35 s (ref 24–37)

## 2015-09-02 ENCOUNTER — Other Ambulatory Visit: Payer: Self-pay

## 2015-09-02 DIAGNOSIS — I739 Peripheral vascular disease, unspecified: Secondary | ICD-10-CM

## 2015-09-05 ENCOUNTER — Encounter (HOSPITAL_COMMUNITY)
Admission: RE | Disposition: A | Discharge status: Home / Self Care | Payer: Self-pay | Source: Ambulatory Visit | Attending: Cardiovascular Disease

## 2015-09-05 ENCOUNTER — Ambulatory Visit (HOSPITAL_COMMUNITY)
Admission: RE | Admit: 2015-09-05 | Discharge: 2015-09-05 | Disposition: A | Discharge status: Home / Self Care | Payer: Medicare Other | Source: Ambulatory Visit | Attending: Cardiovascular Disease | Admitting: Cardiovascular Disease

## 2015-09-05 DIAGNOSIS — I739 Peripheral vascular disease, unspecified: Secondary | ICD-10-CM

## 2015-09-05 DIAGNOSIS — Z95820 Peripheral vascular angioplasty status with implants and grafts: Secondary | ICD-10-CM | POA: Diagnosis not present

## 2015-09-05 DIAGNOSIS — E663 Overweight: Secondary | ICD-10-CM | POA: Insufficient documentation

## 2015-09-05 DIAGNOSIS — I70212 Atherosclerosis of native arteries of extremities with intermittent claudication, left leg: Secondary | ICD-10-CM | POA: Diagnosis not present

## 2015-09-05 DIAGNOSIS — I10 Essential (primary) hypertension: Secondary | ICD-10-CM | POA: Diagnosis not present

## 2015-09-05 DIAGNOSIS — Z87891 Personal history of nicotine dependence: Secondary | ICD-10-CM | POA: Diagnosis not present

## 2015-09-05 DIAGNOSIS — G8929 Other chronic pain: Secondary | ICD-10-CM | POA: Insufficient documentation

## 2015-09-05 DIAGNOSIS — Z7902 Long term (current) use of antithrombotics/antiplatelets: Secondary | ICD-10-CM | POA: Diagnosis not present

## 2015-09-05 DIAGNOSIS — Z8249 Family history of ischemic heart disease and other diseases of the circulatory system: Secondary | ICD-10-CM | POA: Diagnosis not present

## 2015-09-05 DIAGNOSIS — E785 Hyperlipidemia, unspecified: Secondary | ICD-10-CM | POA: Insufficient documentation

## 2015-09-05 DIAGNOSIS — I2510 Atherosclerotic heart disease of native coronary artery without angina pectoris: Secondary | ICD-10-CM | POA: Diagnosis not present

## 2015-09-05 DIAGNOSIS — Z6834 Body mass index (BMI) 34.0-34.9, adult: Secondary | ICD-10-CM | POA: Insufficient documentation

## 2015-09-05 DIAGNOSIS — Z7982 Long term (current) use of aspirin: Secondary | ICD-10-CM | POA: Diagnosis not present

## 2015-09-05 DIAGNOSIS — M545 Low back pain: Secondary | ICD-10-CM | POA: Diagnosis not present

## 2015-09-05 DIAGNOSIS — M1990 Unspecified osteoarthritis, unspecified site: Secondary | ICD-10-CM | POA: Diagnosis not present

## 2015-09-05 HISTORY — PX: PERIPHERAL VASCULAR CATHETERIZATION: SHX172C

## 2015-09-05 SURGERY — LOWER EXTREMITY ANGIOGRAPHY

## 2015-09-05 MED ORDER — HYDRALAZINE HCL 20 MG/ML IJ SOLN: 10.0000 mg | INTRAMUSCULAR | Status: DC | PRN

## 2015-09-05 MED ORDER — SODIUM CHLORIDE 0.9 % IV SOLN: INTRAVENOUS | Status: AC

## 2015-09-05 MED ORDER — MIDAZOLAM HCL 2 MG/2ML IJ SOLN
INTRAMUSCULAR | Status: AC
  Filled 2015-09-05: qty 4

## 2015-09-05 MED ORDER — ASPIRIN 81 MG PO CHEW
81.0000 mg | CHEWABLE_TABLET | ORAL | Status: AC
Start: 2015-09-05 — End: 2015-09-05
  Administered 2015-09-05: 81 mg via ORAL

## 2015-09-05 MED ORDER — MORPHINE SULFATE (PF) 2 MG/ML IV SOLN: 2.0000 mg | INTRAVENOUS | Status: DC | PRN

## 2015-09-05 MED ORDER — MIDAZOLAM HCL 2 MG/2ML IJ SOLN
INTRAMUSCULAR | Status: DC | PRN
  Administered 2015-09-05: 1 mg via INTRAVENOUS

## 2015-09-05 MED ORDER — HEPARIN (PORCINE) IN NACL 2-0.9 UNIT/ML-% IJ SOLN
INTRAMUSCULAR | Status: AC
  Filled 2015-09-05: qty 1000

## 2015-09-05 MED ORDER — LIDOCAINE HCL (PF) 1 % IJ SOLN
INTRAMUSCULAR | Status: AC
  Filled 2015-09-05: qty 30

## 2015-09-05 MED ORDER — SODIUM CHLORIDE 0.9 % WEIGHT BASED INFUSION
3.0000 mL/kg/h | INTRAVENOUS | Status: DC
  Administered 2015-09-05: 3 mL/kg/h via INTRAVENOUS

## 2015-09-05 MED ORDER — FENTANYL CITRATE (PF) 100 MCG/2ML IJ SOLN
INTRAMUSCULAR | Status: AC
  Filled 2015-09-05: qty 4

## 2015-09-05 MED ORDER — IODIXANOL 320 MG/ML IV SOLN
INTRAVENOUS | Status: DC | PRN
  Administered 2015-09-05: 80 mL via INTRA_ARTERIAL

## 2015-09-05 MED ORDER — ACETAMINOPHEN 325 MG PO TABS: 650.0000 mg | ORAL_TABLET | ORAL | Status: DC | PRN

## 2015-09-05 MED ORDER — HYDROCODONE-ACETAMINOPHEN 5-325 MG PO TABS
2.0000 | ORAL_TABLET | ORAL | Status: DC | PRN
  Administered 2015-09-05 (×2): 2 via ORAL

## 2015-09-05 MED ORDER — ONDANSETRON HCL 4 MG/2ML IJ SOLN: 4.0000 mg | Freq: Four times a day (QID) | INTRAMUSCULAR | Status: DC | PRN

## 2015-09-05 MED ORDER — HYDROCODONE-ACETAMINOPHEN 10-325 MG PO TABS: 1.0000 | ORAL_TABLET | ORAL | Status: DC | PRN

## 2015-09-05 MED ORDER — LIDOCAINE HCL (PF) 1 % IJ SOLN
INTRAMUSCULAR | Status: DC | PRN
  Administered 2015-09-05: 16:00:00

## 2015-09-05 MED ORDER — ASPIRIN EC 325 MG PO TBEC: 325.0000 mg | DELAYED_RELEASE_TABLET | Freq: Every day | ORAL | Status: DC

## 2015-09-05 MED ORDER — SODIUM CHLORIDE 0.9 % IJ SOLN: 3.0000 mL | INTRAMUSCULAR | Status: DC | PRN

## 2015-09-05 MED ORDER — SODIUM CHLORIDE 0.9 % WEIGHT BASED INFUSION: 1.0000 mL/kg/h | INTRAVENOUS | Status: DC

## 2015-09-05 MED ORDER — HYDROCODONE-ACETAMINOPHEN 5-325 MG PO TABS
ORAL_TABLET | ORAL | Status: AC
  Filled 2015-09-05: qty 2

## 2015-09-05 MED ORDER — ASPIRIN 81 MG PO CHEW
CHEWABLE_TABLET | ORAL | Status: AC
  Filled 2015-09-05: qty 1

## 2015-09-05 MED ORDER — FENTANYL CITRATE (PF) 100 MCG/2ML IJ SOLN
INTRAMUSCULAR | Status: DC | PRN
  Administered 2015-09-05: 25 ug via INTRAVENOUS

## 2015-09-05 SURGICAL SUPPLY — 11 items
CATH ANGIO 5F PIGTAIL 65CM (CATHETERS) IMPLANT
CATH CROSS OVER TEMPO 5F (CATHETERS) ×2 IMPLANT
CATH STRAIGHT 5FR 65CM (CATHETERS) ×2 IMPLANT
KIT PV (KITS) ×3 IMPLANT
SHEATH PINNACLE 5F 10CM (SHEATH) ×2 IMPLANT
STOPCOCK MORSE 400PSI 3WAY (MISCELLANEOUS) ×2 IMPLANT
SYR MEDRAD MARK V 150ML (SYRINGE) ×3 IMPLANT
TRANSDUCER W/STOPCOCK (MISCELLANEOUS) ×3 IMPLANT
TRAY PV CATH (CUSTOM PROCEDURE TRAY) ×3 IMPLANT
TUBING CIL FLEX 10 FLL-RA (TUBING) ×2 IMPLANT
WIRE HITORQ VERSACORE ST 145CM (WIRE) ×2 IMPLANT

## 2015-09-05 NOTE — H&P (View-Only) (Signed)
  Cardiology Office Note   Date:  08/29/2015   ID:  Philip Hurst, DOB 05/10/1941, MRN [id number]  PCP:  ELKINS,WILSON OLIVER, MD  Cardiologist:  Dr Berry  Vantasia Pinkney, PA-C   Chief Complaint  Patient presents with  . Follow-up    pt c/o no chest pain no SOB no dizziness, and no swelling legs    History of Present Illness: Philip Hurst is a 73 y.o. male with a history of PAD, HTN, CAD (RCA 100%), HL. He had leg pain and ABIs showed severed disease.   Philip Hurst presents for evaluation of PAD.  Philip Hurst has a long history of PAD. Over the last several weeks, he has noticed increasing pain with ambulation. He was evaluated by Dr. Elkins and scheduled for ABIs. These were performed today and showed a significant decrease in left lower extremity circulation with an ABI 0.32 on preliminary reports.  Philip Hurst states that 4 weeks he has been noticing increasing pain with ambulation. It will start shortly after he starts walking up a hill. He can walk a longer distance on flat ground without discomfort but he will still get pain in less than 200 feet.   The pain is a cramping and tightness. It is in his thigh and lower leg. In about 200 feet, the pain will be so severe that he has to stop. He will also get severe lower extremity pain and about 10 minutes just standing still. If he is sitting and resting, he does not get much pain.   He has decreased sensation in his left foot as well. The foot is cooler than his right foot. Wounds have been slower to heal, but they have healed. He has only one small scratch that is fairly recent and is not infected.   Past Medical History  Diagnosis Date  . Hypertension   . Hyperlipidemia   . Coronary artery disease     OCCLUDED RCA  . Claudication of left lower extremity, ABI Lt 0.52 -lifestyle limiting  ABI rt 0.90 01/30/2013  . Normal cardiac stress test, No ischemia 12/15/12 01/30/2013  . CAD (coronary artery disease), single vessel disease with  collaterals, medical therapy 09/12/2011  . PAD (peripheral artery disease)   . Arthritis     "back, joints in right hand" (08/18/2013)  . Chronic lower back pain     Past Surgical History  Procedure Laterality Date  . Cardiovascular stress test  12/07/2008    EF 58%, NO ISCHEMIA  . Popliteal artery stent Left 01/30/13  . Cardiac catheterization  @2002    1 vessel disease w/collaterals,medical therapy  . Back surgery    . 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 Lower extremities arterial doppler  02/16/2013    bilateral ABIs normal values w/o evidence arterial insuff. at rest; left  lower extrem. norm. left popliteal artery stent norm. patency  . Popliteal artery stent Left 10/'28/2014    successful Viabahn covered stent placement within the previously placed IDEV  stents/notes 08/18/2013  . Tonsillectomy    . Wrist fusion Right 1990's?    "first OR didn't correct problem" (08/18/2013)  . Lumbar disc surgery  1990's    "herniated disc" (08/18/2013)  . Posterior lumbar fusion  1990's  . Cataract extraction w/ intraocular lens  implant, bilateral Bilateral   . Percutaneous stent intervention  01/30/2013    Procedure: PERCUTANEOUS STENT INTERVENTION;  Surgeon: Jonathan J Berry, MD;  Location: [facility] C[facility]   [facility];  Service: Cardiovascular;;  . Lower extremity angiogram Left 08/18/2013    Procedure: LOWER EXTREMITY ANGIOGRAM;  Surgeon: Jonathan J Berry, MD;  Location: [facility]ATH [facility];  Service: Cardiovascular;  Laterality: Left;    Current Outpatient Prescriptions  Medication Sig Dispense Refill  . aspirin EC 81 MG EC tablet Take 1 tablet (81 mg total) by mouth daily. 30 tablet 0  . atorvastatin (LIPITOR) 10 MG tablet Take 10 mg by mouth daily.  0  . BRILINTA 90 MG TABS tablet TAKE 1 TABLET BY MOUTH TWICE DAILY 60 tablet 10  . cilostazol (PLETAL) 50 MG tablet TAKE ONE TABLET BY MOUTH TWICE DAILY 60 tablet 5  . fish oil-omega-3 fatty acids 1000 MG capsule Take 1 g by mouth 2  (two) times daily.     . hydrochlorothiazide (HYDRODIURIL) 25 MG tablet Take 25 mg by mouth daily.    . HYDROcodone-acetaminophen (NORCO) 10-325 MG per tablet Take 1 tablet by mouth every 4 (four) hours as needed for pain.     . ketoconazole (NIZORAL) 2 % shampoo Apply 1 application topically 2 (two) times a week.    . losartan (COZAAR) 100 MG tablet Take 100 mg by mouth daily.    . lovastatin (MEVACOR) 40 MG tablet Take 40 mg by mouth at bedtime.       No current facility-administered medications for this visit.    Allergies:   Plavix    Social History:  The patient  reports that he quit smoking about 18 years ago. His smoking use included Cigarettes. He has a 44 pack-year smoking history. His smokeless tobacco use includes Chew. He reports that he drinks alcohol. He reports that he does not use illicit drugs.   Family History:  The patient's family history includes Cancer in his father; Coronary artery disease in his brother; Diabetes in his brother; Seizures in his mother.    ROS:  Please see the history of present illness. All other systems are reviewed and negative.    PHYSICAL EXAM: VS:  BP 128/68 mmHg  Pulse 116  Ht 5' 3" (1.6 m)  Wt 193 lb (87.544 kg)  BMI 34.20 kg/m2 , BMI Body mass index is 34.2 kg/(m^2). GEN: Well nourished, well developed, male in no acute distress HEENT: normal for age  Neck: no JVD, no carotid bruit, no masses Cardiac: RRR; no murmur, no rubs, or gallops Respiratory:  clear to auscultation bilaterally, normal work of breathing GI: soft, nontender, nondistended, + BS MS: no deformity or atrophy; no edema; distal pulses are 2+ both upper extremities. Bilateral femoral bruits are noted. 2+ R DP, decreased R PT, L PT not palpable, left DP barely palpable. Left foot is cool to touch and capillary refill in some areas is 10 seconds.  Skin: warm and dry, no rash Neuro:  Strength and sensation are intact Psych: euthymic mood, full affect   EKG:  EKG is  not ordered today.  Recent Labs: No results found for requested labs within last 365 days.    Lipid Panel No results found for: CHOL, TRIG, HDL, CHOLHDL, VLDL, LDLCALC, LDLDIRECT   Wt Readings from Last 3 Encounters:  08/29/15 193 lb (87.544 kg)  10/27/14 203 lb 3.2 oz (92.171 kg)  09/10/13 190 lb (86.183 kg)     Other studies Reviewed: Additional studies/ records that were reviewed today include: Previous office notes, phone notes, vascular studies.  ASSESSMENT AND PLAN:  1.  Critical limb ischemia: Philip Hurst is not having severe resting pain. He   has decreased sensation in his left foot but it is not completely numb. Contacted Dr. Berry, and he will need a lower extremity angiogram early next week. He will get preprocedure labs and a chest x-ray on Wednesday. He was anemic on the last labs we have in the system and he also has had some mild renal insufficiency as well. If he needs to come in early for preprocedure hydration, we will arrange this.  2. CAD: He has no ongoing ischemic symptoms, continue current therapy.  3. Hypertension: His blood pressure is well controlled on current therapy  4. Hyperlipidemia: He is to continue fish oil and Mevacor. We will check a profile and LFTs when he comes in for the procedure.  Current medicines are reviewed at length with the patient today.  The patient does not have concerns regarding medicines.  The following changes have been made:  no change  Labs/ tests ordered today include:   Orders Placed This Encounter  Procedures  . DG Chest 2 View  . CBC  . Basic metabolic panel  . Protime-INR  . APTT  . TSH  . LOWER EXTREMITY ANGIOGRAM   Disposition:   FU with Dr. Berry after the procedure  Signed, Jedaiah Rathbun, PA-C  08/29/2015 5:36 PM    [facility] [address], [place], [place]  [address] Phone: [phone]; Fax: [phone]     

## 2015-09-05 NOTE — Interval H&P Note (Signed)
History and Physical Interval Note:  09/05/2015 3:30 PM  Philip Hurst  has presented today for surgery, with the diagnosis of claudication  The various methods of treatment have been discussed with the patient and family. After consideration of risks, benefits and other options for treatment, the patient has consented to  Procedure(s): Lower Extremity Angiography (N/A) as a surgical intervention .  The patient's history has been reviewed, patient examined, no change in status, stable for surgery.  I have reviewed the patient's chart and labs.  Questions were answered to the patient's satisfaction.     Quay Burow

## 2015-09-05 NOTE — Progress Notes (Signed)
Site area: right groin 5 french arterial sheath was removed  Site Prior to Removal:  Level 0  Pressure Applied For 15 MINUTES    Minutes Beginning at 1630 Manual:   Yes.    Patient Status During Pull:  stable  Post Pull Groin Site:  Level 0  Post Pull Instructions Given:  Yes.    Post Pull Pulses Present:  Yes.    Dressing Applied:  Yes.    Comments:  VS remain stable during sheath pull

## 2015-09-05 NOTE — Discharge Instructions (Signed)
Angiogram, Care After °Refer to this sheet in the next few weeks. These instructions provide you with information about caring for yourself after your procedure. Your health care provider may also give you more specific instructions. Your treatment has been planned according to current medical practices, but problems sometimes occur. Call your health care provider if you have any problems or questions after your procedure. °WHAT TO EXPECT AFTER THE PROCEDURE °After your procedure, it is typical to have the following: °· Bruising at the catheter insertion site that usually fades within 1-2 weeks. °· Blood collecting in the tissue (hematoma) that may be painful to the touch. It should usually decrease in size and tenderness within 1-2 weeks. °HOME CARE INSTRUCTIONS °· Take medicines only as directed by your health care provider. °· You may shower 24-48 hours after the procedure or as directed by your health care provider. Remove the bandage (dressing) and gently wash the site with plain soap and water. Pat the area dry with a clean towel. Do not rub the site, because this may cause bleeding. °· Do not take baths, swim, or use a hot tub until your health care provider approves. °· Check your insertion site every day for redness, swelling, or drainage. °· Do not apply powder or lotion to the site. °· Do not lift over 10 lb (4.5 kg) for 5 days after your procedure or as directed by your health care provider. °· Ask your health care provider when it is okay to: °¨ Return to work or school. °¨ Resume usual physical activities or sports. °¨ Resume sexual activity. °· Do not drive home if you are discharged the same day as the procedure. Have someone else drive you. °· You may drive 24 hours after the procedure unless otherwise instructed by your health care provider. °· Do not operate machinery or power tools for 24 hours after the procedure or as directed by your health care provider. °· If your procedure was done as an  outpatient procedure, which means that you went home the same day as your procedure, a responsible adult should be with you for the first 24 hours after you arrive home. °· Keep all follow-up visits as directed by your health care provider. This is important. °SEEK MEDICAL CARE IF: °· You have a fever. °· You have chills. °· You have increased bleeding from the catheter insertion site. Hold pressure on the site. °SEEK IMMEDIATE MEDICAL CARE IF: °· You have unusual pain at the catheter insertion site. °· You have redness, warmth, or swelling at the catheter insertion site. °· You have drainage (other than a small amount of blood on the dressing) from the catheter insertion site. °· The catheter insertion site is bleeding, and the bleeding does not stop after 30 minutes of holding steady pressure on the site. °· The area near or just beyond the catheter insertion site becomes pale, cool, tingly, or numb. °  °This information is not intended to replace advice given to you by your health care provider. Make sure you discuss any questions you have with your health care provider. °  °Document Released: 04/26/2005 Document Revised: 10/29/2014 Document Reviewed: 03/11/2013 °Elsevier Interactive Patient Education ©2016 [employer] ° °

## 2015-09-06 ENCOUNTER — Encounter (HOSPITAL_COMMUNITY): Payer: Self-pay | Admitting: Cardiovascular Disease

## 2015-09-07 ENCOUNTER — Ambulatory Visit: Payer: Medicare Other | Admitting: Cardiovascular Disease

## 2015-09-21 ENCOUNTER — Encounter: Payer: Self-pay | Admitting: Cardiovascular Disease

## 2015-09-21 ENCOUNTER — Ambulatory Visit (INDEPENDENT_AMBULATORY_CARE_PROVIDER_SITE_OTHER): Payer: Medicare Other | Admitting: Cardiovascular Disease

## 2015-09-21 VITALS — BP 136/74 | HR 87 | Ht 63.0 in | Wt 184.0 lb

## 2015-09-21 DIAGNOSIS — I10 Essential (primary) hypertension: Secondary | ICD-10-CM

## 2015-09-21 DIAGNOSIS — I739 Peripheral vascular disease, unspecified: Secondary | ICD-10-CM | POA: Diagnosis not present

## 2015-09-21 DIAGNOSIS — E785 Hyperlipidemia, unspecified: Secondary | ICD-10-CM

## 2015-09-21 DIAGNOSIS — I2583 Coronary atherosclerosis due to lipid rich plaque: Secondary | ICD-10-CM

## 2015-09-21 DIAGNOSIS — I2510 Atherosclerotic heart disease of native coronary artery without angina pectoris: Secondary | ICD-10-CM | POA: Diagnosis not present

## 2015-09-21 MED ORDER — CILOSTAZOL 50 MG PO TABS: 50.0000 mg | ORAL_TABLET | Freq: Two times a day (BID) | ORAL | Status: DC

## 2015-09-21 NOTE — Patient Instructions (Signed)
Medication Instructions:  Your physician has recommended you make the following change in your medication:  1) START Pletal 50 mg tablet by mouth TWICE daily   Labwork: none  Testing/Procedures: none  Follow-Up: You have been referred to Dr. Jen Mow - Left Fem-Tib bypass graft  Your physician wants you to follow-up in: 6 months with Dr. Gwenlyn Found. You will receive a reminder letter in the mail two months in advance. If you don't receive a letter, please call our office to schedule the follow-up appointment.   Any Other Special Instructions Will Be Listed Below (If Applicable).     If you need a refill on your cardiac medications before your next appointment, please call your pharmacy.

## 2015-09-21 NOTE — Assessment & Plan Note (Signed)
History of hyperlipidemia on atorvastatin followed by his PCP 

## 2015-09-21 NOTE — Assessment & Plan Note (Signed)
History of PAD status post initial popliteal artery intervention using an IDEV self-expanding stent back in 2014. Because of "in-stent restenosis" he underwent restenting using a viable on coverage stent 08/18/13. Recent Dopplers suggested once again that his popliteal artery had occluded on that side (08/29/15) and angiography performed 09/05/15 confirm this. He had an occluded popliteal artery stent on the left as well as tibioperoneal trunk with reconstituted anterior tibial and peroneal arteries. He does complain of significant lifestyle limiting claudication. I am going to begin him on Pletal 50 mg by mouth twice a day I'm referring him to Dr. Trula Slade for consideration of fem-tib bypass grafting.

## 2015-09-21 NOTE — Assessment & Plan Note (Signed)
History of CAD status post remote chronic catheterization by Dr. Karlene Lineman or tingling years ago revealing one occluded coronary artery with collaterals. Medical therapy was recommended. He denies chest pain or shortness of breath.

## 2015-09-21 NOTE — Progress Notes (Signed)
09/21/2015 Philip Hurst   06/13/1941  CA:[id number]  Primary Physician Leonard Downing, MD Primary Cardiologist: Lorretta Harp MD [name]   HPI:  The patient is a 74 -year-old Caucasian male who is overweight. He has a history of peripheral artery disease and is having symptoms of claudication with a left lower extremity ABI of 0.52 and a right of 0.90. I last saw him in the office 10/27/14.His history also includes hypertension, coronary artery disease, hyperlipidemia. He had a normal cardiac stress test on December 15, 2012 which was nonischemic. He also has dyslipidemia and has remote tobacco abuse but quit 20 years ago. Dr. Gwenlyn Found recently performed a lower extremity angiogram which revealed an occluded popliteal artery in the left lower extremity. The right was normal. He also had widely patent renal arteries. He underwent successful balloon angioplasty and stenting of the occluded segment using a chocolate balloon, overlapping IDEV self-expanding stent. The patient now presents today in followup to that procedure. He appears to be doing quite well. He is feeling a little bit under the weather, however, he feels that the left lower extremity claudication symptoms have resolved. He has not done quite as much walking as he used to, but on one of his walks which normally would have caused him problems he did not have any pain. He also denies nausea, vomiting, fever, dizziness, lightheadedness, chest pain, shortness of breath, edema.   The patient was taking Plavix upon discharge from the hospital but developed an allergy to it and was stopped. His aspirin was then increased to 325 mg daily.he had a followup Doppler study performed 02/16/13 revealing a left ABI of 1 with a widely patent popliteal artery. the patient underwent repeat angiography October 2014 revealing an occluded IDE V stent in the left popliteal artery. He underwent Viabahn restenting with excellent angiographic,  ultrasonographic and clinical result up until present 2 weeks ago when he developed sudden onset of recurrent left calf pain. Subsequent Doppler's showed occlusion of his popliteal artery with an ABI in the 0.3 range. He underwent peripheral angiography by myself 09/05/15 revealing an occluded popliteal artery stent, occluded P3 segment of the popliteal artery on the left with reconstitution of the anterior tibial and peroneal arteries. He is considerably symptomatic with limitation of his activities of daily living. I do not think he has a percutaneous solution for revascularization given the 2 prior stenting procedures. I'm going to begin him on Pletal and for him to Dr. Trula Slade for consideration of fem-tib bypass grafting  Current Outpatient Prescriptions  Medication Sig Dispense Refill  . aspirin EC 81 MG EC tablet Take 1 tablet (81 mg total) by mouth daily. 30 tablet 0  . atorvastatin (LIPITOR) 10 MG tablet Take 10 mg by mouth daily.  0  . BRILINTA 90 MG TABS tablet TAKE 1 TABLET BY MOUTH TWICE DAILY (Patient taking differently: TAKE 90 MG BY MOUTH TWICE DAILY) 60 tablet 10  . hydrochlorothiazide (HYDRODIURIL) 25 MG tablet Take 25 mg by mouth daily.    Marland Kitchen HYDROcodone-acetaminophen (NORCO) 10-325 MG per tablet Take 1 tablet by mouth every 4 (four) hours as needed for pain.     Marland Kitchen ketoconazole (NIZORAL) 2 % shampoo Apply 1 application topically 2 (two) times a week.    . losartan (COZAAR) 100 MG tablet Take 100 mg by mouth daily.    . cilostazol (PLETAL) 50 MG tablet Take 1 tablet (50 mg total) by mouth 2 (two) times daily. 90 tablet 3  No current facility-administered medications for this visit.    Allergies  Allergen Reactions  . Plavix [Clopidogrel Bisulfate] Itching and Rash    Social History   Social History  . Marital Status: Married    Spouse Name: [name]  . Number of Children: 4  . Years of Education: N/A   Occupational History  . Not on file.   Social History Main Topics    . Smoking status: Former Smoker -- 1.00 packs/day for 44 years    Types: Cigarettes    Quit date: 08/26/1997  . Smokeless tobacco: Current User    Types: Chew  . Alcohol Use: Yes     Comment: 08/18/2013 "glass of wine 5-6 months ago; very rare"  . Drug Use: No  . Sexual Activity: No   Other Topics Concern  . Not on file   Social History Narrative     Review of Systems: General: negative for chills, fever, night sweats or weight changes.  Cardiovascular: negative for chest pain, dyspnea on exertion, edema, orthopnea, palpitations, paroxysmal nocturnal dyspnea or shortness of breath Dermatological: negative for rash Respiratory: negative for cough or wheezing Urologic: negative for hematuria Abdominal: negative for nausea, vomiting, diarrhea, bright red blood per rectum, melena, or hematemesis Neurologic: negative for visual changes, syncope, or dizziness All other systems reviewed and are otherwise negative except as noted above.    Blood pressure 136/74, pulse 87, height 5\' 3"  (1.6 m), weight 184 lb (83.462 kg).  General appearance: alert and no distress Neck: no adenopathy, no carotid bruit, no JVD, supple, symmetrical, trachea midline and thyroid not enlarged, symmetric, no tenderness/mass/nodules Lungs: clear to auscultation bilaterally Heart: regular rate and rhythm, S1, S2 normal, no murmur, click, rub or gallop Extremities: extremities normal, atraumatic, no cyanosis or edema  EKG normal sinus rhythm 87 without ST or T-wave changes. I personally reviewed this EKG  ASSESSMENT AND PLAN:   PAD (peripheral artery disease) ([place]) History of PAD status post initial popliteal artery intervention using an IDEV self-expanding stent back in 2014. Because of "in-stent restenosis" he underwent restenting using a viable on coverage stent 08/18/13. Recent Dopplers suggested once again that his popliteal artery had occluded on that side (08/29/15) and angiography performed 09/05/15  confirm this. He had an occluded popliteal artery stent on the left as well as tibioperoneal trunk with reconstituted anterior tibial and peroneal arteries. He does complain of significant lifestyle limiting claudication. I am going to begin him on Pletal 50 mg by mouth twice a day I'm referring him to Dr. Trula Slade for consideration of fem-tib bypass grafting.  Hypertension History of hypertension blood pressure measured at 136/74. He is on losartan  And hydrochlorothiazide. Continue current meds at current dosing  Hyperlipidemia History of hyperlipidemia on atorvastatin followed by his PCP  CAD (coronary artery disease), single vessel disease with collaterals, medical therapy History of CAD status post remote chronic catheterization by Dr. Karlene Lineman or tingling years ago revealing one occluded coronary artery with collaterals. Medical therapy was recommended. He denies chest pain or shortness of breath.      Lorretta Harp MD FACP,FACC,FAHA, [facility] 09/21/2015 8:27 AM

## 2015-09-21 NOTE — Assessment & Plan Note (Signed)
History of hypertension blood pressure measured at 136/74. He is on losartan  And hydrochlorothiazide. Continue current meds at current dosing

## 2015-09-27 ENCOUNTER — Ambulatory Visit: Payer: Medicare Other | Admitting: Cardiovascular Disease

## 2015-10-04 ENCOUNTER — Ambulatory Visit: Payer: Medicare Other | Admitting: Cardiovascular Disease

## 2015-10-20 ENCOUNTER — Encounter: Payer: Self-pay | Admitting: Surgery

## 2015-10-31 ENCOUNTER — Encounter: Payer: Medicare Other | Admitting: Surgery

## 2015-11-11 ENCOUNTER — Encounter: Payer: Self-pay | Admitting: Surgery

## 2015-11-21 ENCOUNTER — Ambulatory Visit (INDEPENDENT_AMBULATORY_CARE_PROVIDER_SITE_OTHER): Payer: Medicare Other | Admitting: Surgery

## 2015-11-21 ENCOUNTER — Encounter: Payer: Self-pay | Admitting: Surgery

## 2015-11-21 VITALS — BP 123/85 | HR 116 | Temp 97.0°F | Resp 16 | Ht 63.0 in | Wt 183.0 lb

## 2015-11-21 DIAGNOSIS — I70212 Atherosclerosis of native arteries of extremities with intermittent claudication, left leg: Secondary | ICD-10-CM | POA: Diagnosis not present

## 2015-11-21 NOTE — Progress Notes (Signed)
Patient name: Philip Hurst MRN: [id number] DOB: 1941/05/11 Sex: male   Referred by: Dr. Gwenlyn Found  Reason for referral:  Chief Complaint  Patient presents with  . New Evaluation    Ref. by Dr. Adora Fridge  C/O Eval. for possible Left Fem-Tib BPG     HISTORY OF PRESENT ILLNESS: This is a 75 year old gentleman who comes to me for evaluation of severe claudication.  The patient developed lifestyle limiting claudication symptoms in 2013.  At that time he could walk approximately 1/4 mile before he got pain in his calf.  He underwent percutaneous revascularization with stenting.  This remained patent for while but ultimately had to be reopened and lined with Viabahn stents.  These have recently occluded.  Now the patient has significant claudication at less than 100 feet.  His ABI on the left was 0.32.  He does not have any open wounds or rest pain.  The patient has a history of coronary artery disease.  He has 100% occlusion of his right coronary documented by angiography and treated medically.  He suffers from hypercholesterolemia which is managed with Lipitor.  His hypertension is controlled with an ARB.  He is a former smoker  Past Medical History  Diagnosis Date  . Hypertension   . Hyperlipidemia   . Coronary artery disease     OCCLUDED RCA  . Claudication of left lower extremity, ABI Lt 0.52 -lifestyle limiting  ABI rt 0.90 01/30/2013  . Normal cardiac stress test, No ischemia 12/15/12 01/30/2013  . CAD (coronary artery disease), single vessel disease with collaterals, medical therapy 09/12/2011  . PAD (peripheral artery disease) ([place])   . Arthritis     "back, joints in right hand" (08/18/2013)  . Chronic lower back pain     Past Surgical History  Procedure Laterality Date  . Cardiovascular stress test  12/07/2008    EF 58%, NO ISCHEMIA  . Popliteal artery stent Left 01/30/13  . Cardiac catheterization  @2002     1 vessel disease w/collaterals,medical therapy  . Back surgery    .  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 Lower extremities arterial doppler  02/16/2013    bilateral ABIs normal values w/o evidence arterial insuff. at rest; left  lower extrem. norm. left popliteal artery stent norm. patency  . Popliteal artery stent Left 10/'28/2014    successful Viabahn covered stent placement within the previously placed IDEV  stents/notes 08/18/2013  . Tonsillectomy    . Wrist fusion Right 1990's?    "first OR didn't correct problem" (08/18/2013)  . Lumbar disc surgery  1990's    "herniated disc" (08/18/2013)  . Posterior lumbar fusion  1990's  . Cataract extraction w/ intraocular lens  implant, bilateral Bilateral   . Percutaneous stent intervention  01/30/2013    Procedure: PERCUTANEOUS STENT INTERVENTION;  Surgeon: Lorretta Harp, MD;  Location: [facility] CATH LAB;  Service: Cardiovascular;;  . Lower extremity angiogram Left 08/18/2013    Procedure: LOWER EXTREMITY ANGIOGRAM;  Surgeon: Lorretta Harp, MD;  Location: [facility] CATH LAB;  Service: Cardiovascular;  Laterality: Left;  . Peripheral vascular catheterization N/A 09/05/2015    Procedure: Lower Extremity Angiography;  Surgeon: Lorretta Harp, MD;  Location: [facility] CV LAB;  Service: Cardiovascular;  Laterality: N/A;    Social History   Social History  . Marital Status: Married    Spouse Name: [name]  . Number of Children: 4  . Years of Education: N/A  Occupational History  . Not on file.   Social History Main Topics  . Smoking status: Former Smoker -- 1.00 packs/day for 44 years    Types: Cigarettes    Quit date: 08/26/1997  . Smokeless tobacco: Current User    Types: Chew  . Alcohol Use: Yes     Comment: 08/18/2013 "glass of wine 5-6 months ago; very rare"  . Drug Use: No  . Sexual Activity: No   Other Topics Concern  . Not on file   Social History Narrative    Family History  Problem Relation Age of Onset  . Seizures Mother   . Cancer Father   . Coronary  artery disease Brother   . Diabetes Brother     Allergies as of 11/21/2015 - Review Complete 11/21/2015  Allergen Reaction Noted  . Plavix [clopidogrel bisulfate] Itching and Rash 07/31/2013    Current Outpatient Prescriptions on File Prior to Visit  Medication Sig Dispense Refill  . aspirin EC 81 MG EC tablet Take 1 tablet (81 mg total) by mouth daily. 30 tablet 0  . atorvastatin (LIPITOR) 10 MG tablet Take 10 mg by mouth daily.  0  . BRILINTA 90 MG TABS tablet TAKE 1 TABLET BY MOUTH TWICE DAILY (Patient taking differently: TAKE 90 MG BY MOUTH TWICE DAILY) 60 tablet 10  . cilostazol (PLETAL) 50 MG tablet Take 1 tablet (50 mg total) by mouth 2 (two) times daily. 90 tablet 3  . hydrochlorothiazide (HYDRODIURIL) 25 MG tablet Take 25 mg by mouth daily.    Marland Kitchen HYDROcodone-acetaminophen (NORCO) 10-325 MG per tablet Take 1 tablet by mouth every 4 (four) hours as needed for pain.     Marland Kitchen ketoconazole (NIZORAL) 2 % shampoo Apply 1 application topically 2 (two) times a week.    . losartan (COZAAR) 100 MG tablet Take 100 mg by mouth daily.     No current facility-administered medications on file prior to visit.     REVIEW OF SYSTEMS: See history of present illness, otherwise all systems are negative  PHYSICAL EXAMINATION:  Filed Vitals:   11/21/15 1347  BP: 123/85  Pulse: 116  Temp: 97 F (36.1 C)  TempSrc: Oral  Resp: 16  Height: 5\' 3"  (1.6 m)  Weight: 183 lb (83.008 kg)  SpO2: 96%   Body mass index is 32.43 kg/(m^2). General: The patient appears their stated age.   HEENT:  No gross abnormalities Pulmonary: Respirations are non-labored Musculoskeletal: There are no major deformities.   Neurologic: No focal weakness or paresthesias are detected, Skin: There are no ulcer or rashes noted. Psychiatric: The patient has normal affect. Cardiovascular: There is a regular rate and rhythm without significant murmur appreciated.  Nonpalpable pedal pulse  Diagnostic Studies: I was unable  to review his angiogram today because of a system air.  According to the procedure note he would need a femoral anterior tibial bypass   Assessment:  Left leg claudication Plan: I discussed with the patient that the next option for revascularization would be a left femoral to anterior tibial bypass graft.  This is according to Dr. Gwenlyn Found.  I have was unable to review the angiogram today because of a system air.  Regardless, I discussed with the patient that I would not recommend a tibial artery bypass graft for his current symptoms.  I have encouraged him to continue with exercise and activity and to learn to make lifestyle adjustments to accommodate his limitations.  We did discuss that he developed rest pain or a  nonhealing wound that we would proceed with surgery.  Because his stents are now occluded, I told him that he should probably stop the Brilinta.  I will defer to Dr. Gwenlyn Found for this.  I will have him follow up with me again in 6 months.  I will get a vein mapping to evaluate the saphenous vein at that time.     Eldridge Abrahams, M.D. [facility] Office: [phone] Pager:  [phone]

## 2016-05-10 ENCOUNTER — Encounter: Payer: Self-pay | Admitting: Surgery

## 2016-05-16 ENCOUNTER — Other Ambulatory Visit: Payer: Self-pay | Admitting: *Deleted

## 2016-05-16 DIAGNOSIS — Z01810 Encounter for preprocedural cardiovascular examination: Secondary | ICD-10-CM

## 2016-05-16 DIAGNOSIS — I739 Peripheral vascular disease, unspecified: Secondary | ICD-10-CM

## 2016-05-21 ENCOUNTER — Ambulatory Visit (HOSPITAL_COMMUNITY): Payer: Medicare Other

## 2016-05-21 ENCOUNTER — Ambulatory Visit: Payer: Medicare Other | Admitting: Surgery

## 2019-07-16 ENCOUNTER — Other Ambulatory Visit: Payer: Self-pay

## 2019-07-16 ENCOUNTER — Encounter (HOSPITAL_COMMUNITY): Payer: Self-pay

## 2019-07-16 ENCOUNTER — Emergency Department (HOSPITAL_COMMUNITY): Payer: Medicare Other

## 2019-07-16 ENCOUNTER — Inpatient Hospital Stay (HOSPITAL_COMMUNITY)
Admission: EM | Admit: 2019-07-16 | Discharge: 2019-07-29 | DRG: 853 | Disposition: A | Discharge status: Home Health | Payer: Medicare Other | Attending: Thoracic Surgery (Cardiothoracic Vascular Surgery) | Admitting: Thoracic Surgery (Cardiothoracic Vascular Surgery)

## 2019-07-16 DIAGNOSIS — N179 Acute kidney failure, unspecified: Secondary | ICD-10-CM | POA: Diagnosis present

## 2019-07-16 DIAGNOSIS — I10 Essential (primary) hypertension: Secondary | ICD-10-CM | POA: Diagnosis not present

## 2019-07-16 DIAGNOSIS — I25118 Atherosclerotic heart disease of native coronary artery with other forms of angina pectoris: Secondary | ICD-10-CM | POA: Diagnosis not present

## 2019-07-16 DIAGNOSIS — E785 Hyperlipidemia, unspecified: Secondary | ICD-10-CM | POA: Diagnosis present

## 2019-07-16 DIAGNOSIS — Z951 Presence of aortocoronary bypass graft: Secondary | ICD-10-CM

## 2019-07-16 DIAGNOSIS — Z95820 Peripheral vascular angioplasty status with implants and grafts: Secondary | ICD-10-CM

## 2019-07-16 DIAGNOSIS — Z833 Family history of diabetes mellitus: Secondary | ICD-10-CM

## 2019-07-16 DIAGNOSIS — I70212 Atherosclerosis of native arteries of extremities with intermittent claudication, left leg: Secondary | ICD-10-CM | POA: Diagnosis present

## 2019-07-16 DIAGNOSIS — Z9841 Cataract extraction status, right eye: Secondary | ICD-10-CM

## 2019-07-16 DIAGNOSIS — Z955 Presence of coronary angioplasty implant and graft: Secondary | ICD-10-CM

## 2019-07-16 DIAGNOSIS — I4892 Unspecified atrial flutter: Secondary | ICD-10-CM | POA: Diagnosis not present

## 2019-07-16 DIAGNOSIS — I440 Atrioventricular block, first degree: Secondary | ICD-10-CM | POA: Diagnosis present

## 2019-07-16 DIAGNOSIS — R0602 Shortness of breath: Secondary | ICD-10-CM

## 2019-07-16 DIAGNOSIS — A419 Sepsis, unspecified organism: Principal | ICD-10-CM

## 2019-07-16 DIAGNOSIS — R6520 Severe sepsis without septic shock: Secondary | ICD-10-CM | POA: Diagnosis present

## 2019-07-16 DIAGNOSIS — G8929 Other chronic pain: Secondary | ICD-10-CM | POA: Diagnosis present

## 2019-07-16 DIAGNOSIS — I5041 Acute combined systolic (congestive) and diastolic (congestive) heart failure: Secondary | ICD-10-CM | POA: Diagnosis present

## 2019-07-16 DIAGNOSIS — Z01810 Encounter for preprocedural cardiovascular examination: Secondary | ICD-10-CM | POA: Diagnosis not present

## 2019-07-16 DIAGNOSIS — R0603 Acute respiratory distress: Secondary | ICD-10-CM

## 2019-07-16 DIAGNOSIS — E782 Mixed hyperlipidemia: Secondary | ICD-10-CM | POA: Diagnosis not present

## 2019-07-16 DIAGNOSIS — J90 Pleural effusion, not elsewhere classified: Secondary | ICD-10-CM

## 2019-07-16 DIAGNOSIS — F17220 Nicotine dependence, chewing tobacco, uncomplicated: Secondary | ICD-10-CM | POA: Diagnosis present

## 2019-07-16 DIAGNOSIS — Z791 Long term (current) use of non-steroidal anti-inflammatories (NSAID): Secondary | ICD-10-CM

## 2019-07-16 DIAGNOSIS — Z20828 Contact with and (suspected) exposure to other viral communicable diseases: Secondary | ICD-10-CM | POA: Diagnosis present

## 2019-07-16 DIAGNOSIS — Z6832 Body mass index (BMI) 32.0-32.9, adult: Secondary | ICD-10-CM

## 2019-07-16 DIAGNOSIS — L237 Allergic contact dermatitis due to plants, except food: Secondary | ICD-10-CM | POA: Diagnosis present

## 2019-07-16 DIAGNOSIS — I214 Non-ST elevation (NSTEMI) myocardial infarction: Secondary | ICD-10-CM | POA: Diagnosis not present

## 2019-07-16 DIAGNOSIS — I25119 Atherosclerotic heart disease of native coronary artery with unspecified angina pectoris: Secondary | ICD-10-CM | POA: Diagnosis not present

## 2019-07-16 DIAGNOSIS — J9601 Acute respiratory failure with hypoxia: Secondary | ICD-10-CM | POA: Diagnosis not present

## 2019-07-16 DIAGNOSIS — J9811 Atelectasis: Secondary | ICD-10-CM | POA: Diagnosis not present

## 2019-07-16 DIAGNOSIS — Z801 Family history of malignant neoplasm of trachea, bronchus and lung: Secondary | ICD-10-CM

## 2019-07-16 DIAGNOSIS — I2584 Coronary atherosclerosis due to calcified coronary lesion: Secondary | ICD-10-CM | POA: Diagnosis present

## 2019-07-16 DIAGNOSIS — I361 Nonrheumatic tricuspid (valve) insufficiency: Secondary | ICD-10-CM | POA: Diagnosis not present

## 2019-07-16 DIAGNOSIS — R7989 Other specified abnormal findings of blood chemistry: Secondary | ICD-10-CM | POA: Diagnosis not present

## 2019-07-16 DIAGNOSIS — I130 Hypertensive heart and chronic kidney disease with heart failure and stage 1 through stage 4 chronic kidney disease, or unspecified chronic kidney disease: Secondary | ICD-10-CM | POA: Diagnosis present

## 2019-07-16 DIAGNOSIS — Z9842 Cataract extraction status, left eye: Secondary | ICD-10-CM

## 2019-07-16 DIAGNOSIS — I248 Other forms of acute ischemic heart disease: Secondary | ICD-10-CM | POA: Diagnosis not present

## 2019-07-16 DIAGNOSIS — Z888 Allergy status to other drugs, medicaments and biological substances status: Secondary | ICD-10-CM | POA: Diagnosis not present

## 2019-07-16 DIAGNOSIS — Z79891 Long term (current) use of opiate analgesic: Secondary | ICD-10-CM

## 2019-07-16 DIAGNOSIS — I2510 Atherosclerotic heart disease of native coronary artery without angina pectoris: Secondary | ICD-10-CM | POA: Diagnosis present

## 2019-07-16 DIAGNOSIS — Z7902 Long term (current) use of antithrombotics/antiplatelets: Secondary | ICD-10-CM

## 2019-07-16 DIAGNOSIS — I25110 Atherosclerotic heart disease of native coronary artery with unstable angina pectoris: Secondary | ICD-10-CM | POA: Diagnosis not present

## 2019-07-16 DIAGNOSIS — N183 Chronic kidney disease, stage 3 unspecified: Secondary | ICD-10-CM | POA: Diagnosis present

## 2019-07-16 DIAGNOSIS — I5181 Takotsubo syndrome: Secondary | ICD-10-CM | POA: Diagnosis present

## 2019-07-16 DIAGNOSIS — Z8249 Family history of ischemic heart disease and other diseases of the circulatory system: Secondary | ICD-10-CM

## 2019-07-16 DIAGNOSIS — Z7982 Long term (current) use of aspirin: Secondary | ICD-10-CM

## 2019-07-16 DIAGNOSIS — E669 Obesity, unspecified: Secondary | ICD-10-CM | POA: Diagnosis present

## 2019-07-16 DIAGNOSIS — J189 Pneumonia, unspecified organism: Secondary | ICD-10-CM | POA: Diagnosis present

## 2019-07-16 DIAGNOSIS — Z79899 Other long term (current) drug therapy: Secondary | ICD-10-CM

## 2019-07-16 DIAGNOSIS — Z961 Presence of intraocular lens: Secondary | ICD-10-CM | POA: Diagnosis present

## 2019-07-16 DIAGNOSIS — E8770 Fluid overload, unspecified: Secondary | ICD-10-CM | POA: Diagnosis not present

## 2019-07-16 DIAGNOSIS — Z9181 History of falling: Secondary | ICD-10-CM

## 2019-07-16 DIAGNOSIS — D62 Acute posthemorrhagic anemia: Secondary | ICD-10-CM | POA: Diagnosis not present

## 2019-07-16 DIAGNOSIS — E860 Dehydration: Secondary | ICD-10-CM | POA: Diagnosis present

## 2019-07-16 DIAGNOSIS — M19041 Primary osteoarthritis, right hand: Secondary | ICD-10-CM | POA: Diagnosis present

## 2019-07-16 DIAGNOSIS — I5021 Acute systolic (congestive) heart failure: Secondary | ICD-10-CM

## 2019-07-16 DIAGNOSIS — F1123 Opioid dependence with withdrawal: Secondary | ICD-10-CM | POA: Diagnosis present

## 2019-07-16 DIAGNOSIS — M545 Low back pain: Secondary | ICD-10-CM | POA: Diagnosis present

## 2019-07-16 DIAGNOSIS — Z9689 Presence of other specified functional implants: Secondary | ICD-10-CM

## 2019-07-16 DIAGNOSIS — R079 Chest pain, unspecified: Secondary | ICD-10-CM | POA: Diagnosis not present

## 2019-07-16 DIAGNOSIS — Z981 Arthrodesis status: Secondary | ICD-10-CM

## 2019-07-16 DIAGNOSIS — I2109 ST elevation (STEMI) myocardial infarction involving other coronary artery of anterior wall: Secondary | ICD-10-CM | POA: Diagnosis not present

## 2019-07-16 LAB — CBC
HCT: 28.9 % — ABNORMAL LOW (ref 39.0–52.0)
Hemoglobin: 9.6 g/dL — ABNORMAL LOW (ref 13.0–17.0)
MCH: 31.4 pg (ref 26.0–34.0)
MCHC: 33.2 g/dL (ref 30.0–36.0)
MCV: 94.4 fL (ref 80.0–100.0)
Platelets: 145 10*3/uL — ABNORMAL LOW (ref 150–400)
RBC: 3.06 MIL/uL — ABNORMAL LOW (ref 4.22–5.81)
RDW: 14.2 % (ref 11.5–15.5)
WBC: 15.6 10*3/uL — ABNORMAL HIGH (ref 4.0–10.5)
nRBC: 0 % (ref 0.0–0.2)

## 2019-07-16 LAB — CBC WITH DIFFERENTIAL/PLATELET
Abs Immature Granulocytes: 0.14 10*3/uL — ABNORMAL HIGH (ref 0.00–0.07)
Basophils Absolute: 0 10*3/uL (ref 0.0–0.1)
Basophils Relative: 0 %
Eosinophils Absolute: 0.1 10*3/uL (ref 0.0–0.5)
Eosinophils Relative: 1 %
HCT: 35.5 % — ABNORMAL LOW (ref 39.0–52.0)
Hemoglobin: 11.7 g/dL — ABNORMAL LOW (ref 13.0–17.0)
Immature Granulocytes: 1 %
Lymphocytes Relative: 5 %
Lymphs Abs: 0.9 10*3/uL (ref 0.7–4.0)
MCH: 32.2 pg (ref 26.0–34.0)
MCHC: 33 g/dL (ref 30.0–36.0)
MCV: 97.8 fL (ref 80.0–100.0)
Monocytes Absolute: 0.8 10*3/uL (ref 0.1–1.0)
Monocytes Relative: 4 %
Neutro Abs: 17.8 10*3/uL — ABNORMAL HIGH (ref 1.7–7.7)
Neutrophils Relative %: 89 %
Platelets: 163 10*3/uL (ref 150–400)
RBC: 3.63 MIL/uL — ABNORMAL LOW (ref 4.22–5.81)
RDW: 14.1 % (ref 11.5–15.5)
WBC: 19.8 10*3/uL — ABNORMAL HIGH (ref 4.0–10.5)
nRBC: 0 % (ref 0.0–0.2)

## 2019-07-16 LAB — URINALYSIS, ROUTINE W REFLEX MICROSCOPIC
Bilirubin Urine: NEGATIVE
Glucose, UA: NEGATIVE mg/dL
Ketones, ur: NEGATIVE mg/dL
Leukocytes,Ua: NEGATIVE
Nitrite: NEGATIVE
Protein, ur: 30 mg/dL — AB
Specific Gravity, Urine: 1.014 (ref 1.005–1.030)
pH: 5 (ref 5.0–8.0)

## 2019-07-16 LAB — COMPREHENSIVE METABOLIC PANEL
ALT: 22 U/L (ref 0–44)
AST: 25 U/L (ref 15–41)
Albumin: 3.1 g/dL — ABNORMAL LOW (ref 3.5–5.0)
Alkaline Phosphatase: 92 U/L (ref 38–126)
Anion gap: 17 — ABNORMAL HIGH (ref 5–15)
BUN: 93 mg/dL — ABNORMAL HIGH (ref 8–23)
CO2: 14 mmol/L — ABNORMAL LOW (ref 22–32)
Calcium: 7.5 mg/dL — ABNORMAL LOW (ref 8.9–10.3)
Chloride: 104 mmol/L (ref 98–111)
Creatinine, Ser: 4.89 mg/dL — ABNORMAL HIGH (ref 0.61–1.24)
GFR calc Af Amer: 12 mL/min — ABNORMAL LOW (ref >60)
GFR calc non Af Amer: 11 mL/min — ABNORMAL LOW (ref >60)
Glucose, Bld: 103 mg/dL — ABNORMAL HIGH (ref 70–99)
Potassium: 4 mmol/L (ref 3.5–5.1)
Sodium: 135 mmol/L (ref 135–145)
Total Bilirubin: 0.1 mg/dL — ABNORMAL LOW (ref 0.3–1.2)
Total Protein: 5.7 g/dL — ABNORMAL LOW (ref 6.5–8.1)

## 2019-07-16 LAB — BASIC METABOLIC PANEL
Anion gap: 10 (ref 5–15)
BUN: 72 mg/dL — ABNORMAL HIGH (ref 8–23)
CO2: 14 mmol/L — ABNORMAL LOW (ref 22–32)
Calcium: 7.4 mg/dL — ABNORMAL LOW (ref 8.9–10.3)
Chloride: 115 mmol/L — ABNORMAL HIGH (ref 98–111)
Creatinine, Ser: 3.49 mg/dL — ABNORMAL HIGH (ref 0.61–1.24)
GFR calc Af Amer: 18 mL/min — ABNORMAL LOW (ref >60)
GFR calc non Af Amer: 16 mL/min — ABNORMAL LOW (ref >60)
Glucose, Bld: 120 mg/dL — ABNORMAL HIGH (ref 70–99)
Potassium: 3.9 mmol/L (ref 3.5–5.1)
Sodium: 139 mmol/L (ref 135–145)

## 2019-07-16 LAB — TROPONIN I (HIGH SENSITIVITY)
Troponin I (High Sensitivity): 56 ng/L — ABNORMAL HIGH (ref <18)
Troponin I (High Sensitivity): 75 ng/L — ABNORMAL HIGH (ref <18)
Troponin I (High Sensitivity): 129 ng/L (ref <18)
Troponin I (High Sensitivity): 231 ng/L (ref <18)
Troponin I (High Sensitivity): 391 ng/L (ref <18)

## 2019-07-16 LAB — SARS CORONAVIRUS 2 BY RT PCR (HOSPITAL ORDER, PERFORMED IN ~~LOC~~ HOSPITAL LAB): SARS Coronavirus 2: NEGATIVE

## 2019-07-16 LAB — CBG MONITORING, ED: Glucose-Capillary: 88 mg/dL (ref 70–99)

## 2019-07-16 LAB — LACTIC ACID, PLASMA: Lactic Acid, Venous: 0.8 mmol/L (ref 0.5–1.9)

## 2019-07-16 LAB — D-DIMER, QUANTITATIVE: D-Dimer, Quant: 1.38 ug/mL-FEU — ABNORMAL HIGH (ref 0.00–0.50)

## 2019-07-16 LAB — MRSA PCR SCREENING: MRSA by PCR: NEGATIVE

## 2019-07-16 MED ORDER — LORATADINE 10 MG PO TABS
10.0000 mg | ORAL_TABLET | Freq: Every day | ORAL | Status: DC
  Administered 2019-07-16 – 2019-07-29 (×13): 10 mg via ORAL
  Filled 2019-07-16 (×13): qty 1

## 2019-07-16 MED ORDER — POLYETHYLENE GLYCOL 3350 17 G PO PACK: 17.0000 g | PACK | Freq: Every day | ORAL | Status: DC | PRN

## 2019-07-16 MED ORDER — ACETAMINOPHEN 650 MG RE SUPP: 650.0000 mg | Freq: Four times a day (QID) | RECTAL | Status: DC | PRN

## 2019-07-16 MED ORDER — SODIUM CHLORIDE 0.9 % IV SOLN
2.0000 g | INTRAVENOUS | Status: DC
  Administered 2019-07-17 – 2019-07-18 (×2): 2 g via INTRAVENOUS
  Filled 2019-07-16 (×2): qty 20

## 2019-07-16 MED ORDER — OXYCODONE HCL ER 10 MG PO T12A
10.0000 mg | EXTENDED_RELEASE_TABLET | Freq: Two times a day (BID) | ORAL | Status: DC
  Administered 2019-07-16 – 2019-07-17 (×4): 10 mg via ORAL
  Filled 2019-07-16 (×4): qty 1

## 2019-07-16 MED ORDER — SODIUM CHLORIDE 0.9 % IV SOLN
500.0000 mg | INTRAVENOUS | Status: DC
  Administered 2019-07-16 – 2019-07-17 (×2): 500 mg via INTRAVENOUS
  Filled 2019-07-16 (×3): qty 500

## 2019-07-16 MED ORDER — VANCOMYCIN HCL IN DEXTROSE 1-5 GM/200ML-% IV SOLN: 1000.0000 mg | Freq: Once | INTRAVENOUS | Status: DC

## 2019-07-16 MED ORDER — SODIUM CHLORIDE 0.9 % IV BOLUS: 1000.0000 mL | Freq: Once | INTRAVENOUS | Status: DC

## 2019-07-16 MED ORDER — ENOXAPARIN SODIUM 30 MG/0.3ML ~~LOC~~ SOLN
30.0000 mg | SUBCUTANEOUS | Status: DC
  Administered 2019-07-16: 18:00:00 30 mg via SUBCUTANEOUS
  Filled 2019-07-16: qty 0.3

## 2019-07-16 MED ORDER — ASPIRIN EC 81 MG PO TBEC
81.0000 mg | DELAYED_RELEASE_TABLET | Freq: Every day | ORAL | Status: DC
  Administered 2019-07-16 – 2019-07-21 (×6): 81 mg via ORAL
  Filled 2019-07-16 (×6): qty 1

## 2019-07-16 MED ORDER — SODIUM CHLORIDE 0.9 % IV BOLUS
30.0000 mL/kg | Freq: Once | INTRAVENOUS | Status: AC
  Administered 2019-07-16: 10:00:00 2313 mL via INTRAVENOUS

## 2019-07-16 MED ORDER — SODIUM CHLORIDE 0.9 % IV SOLN: INTRAVENOUS | Status: DC

## 2019-07-16 MED ORDER — VANCOMYCIN VARIABLE DOSE PER UNSTABLE RENAL FUNCTION (PHARMACIST DOSING): Status: DC

## 2019-07-16 MED ORDER — VANCOMYCIN HCL 10 G IV SOLR
1750.0000 mg | Freq: Once | INTRAVENOUS | Status: DC
  Filled 2019-07-16: qty 1750

## 2019-07-16 MED ORDER — ACETAMINOPHEN 325 MG PO TABS
650.0000 mg | ORAL_TABLET | Freq: Four times a day (QID) | ORAL | Status: DC | PRN
  Administered 2019-07-17: 17:00:00 650 mg via ORAL
  Filled 2019-07-16 (×2): qty 2

## 2019-07-16 MED ORDER — SODIUM CHLORIDE 0.9 % IV BOLUS
1000.0000 mL | Freq: Once | INTRAVENOUS | Status: AC
  Administered 2019-07-16: 1000 mL via INTRAVENOUS

## 2019-07-16 MED ORDER — METRONIDAZOLE IN NACL 5-0.79 MG/ML-% IV SOLN
500.0000 mg | Freq: Once | INTRAVENOUS | Status: AC
  Administered 2019-07-16: 13:00:00 500 mg via INTRAVENOUS
  Filled 2019-07-16: qty 100

## 2019-07-16 MED ORDER — ACETAMINOPHEN 325 MG PO TABS
650.0000 mg | ORAL_TABLET | Freq: Once | ORAL | Status: AC
  Administered 2019-07-16: 11:00:00 650 mg via ORAL
  Filled 2019-07-16: qty 2

## 2019-07-16 MED ORDER — METRONIDAZOLE IN NACL 5-0.79 MG/ML-% IV SOLN: 500.0000 mg | Freq: Three times a day (TID) | INTRAVENOUS | Status: DC

## 2019-07-16 MED ORDER — SODIUM CHLORIDE 0.9 % IV SOLN
INTRAVENOUS | Status: DC
  Administered 2019-07-16 – 2019-07-17 (×4): via INTRAVENOUS

## 2019-07-16 MED ORDER — SODIUM CHLORIDE 0.9 % IV SOLN
2.0000 g | Freq: Once | INTRAVENOUS | Status: AC
  Administered 2019-07-16: 10:00:00 2 g via INTRAVENOUS
  Filled 2019-07-16: qty 2

## 2019-07-16 MED ORDER — SODIUM CHLORIDE 0.9 % IV SOLN: 2.0000 g | INTRAVENOUS | Status: DC

## 2019-07-16 MED ORDER — GABAPENTIN 100 MG PO CAPS
100.0000 mg | ORAL_CAPSULE | Freq: Every day | ORAL | Status: DC
  Administered 2019-07-16 – 2019-07-28 (×13): 100 mg via ORAL
  Filled 2019-07-16 (×13): qty 1

## 2019-07-16 MED ORDER — VANCOMYCIN HCL 10 G IV SOLR
1500.0000 mg | Freq: Once | INTRAVENOUS | Status: AC
  Administered 2019-07-16: 1500 mg via INTRAVENOUS
  Filled 2019-07-16: qty 1500

## 2019-07-16 MED ORDER — ATORVASTATIN CALCIUM 10 MG PO TABS
10.0000 mg | ORAL_TABLET | Freq: Every day | ORAL | Status: DC
  Administered 2019-07-16 – 2019-07-17 (×2): 10 mg via ORAL
  Filled 2019-07-16 (×2): qty 1

## 2019-07-16 NOTE — ED Notes (Signed)
Notified Dr. Elbert Ewings of pt's troponin.

## 2019-07-16 NOTE — ED Notes (Signed)
Pt CBG 88

## 2019-07-16 NOTE — H&P (Addendum)
[facility] Admission History and Physical Service Pager: [phone]  Patient name: Philip Hurst Medical record number: [id number] Date of birth: [birth year] Age: 78 y.o. Gender: male  Primary Care Provider: Leonard Downing, MD Consultants: None Code Status: Full Preferred Emergency Contact: Wife at bedside  Chief Complaint: confusion  Assessment and Plan: ALDINE Hurst is a 78 y.o. male presenting with episode of confusion earlier this morning and few month h/o "not feeling right." PMH is significant for HTN, CAD, HLD, HTN, PAD, former smoker (currently dips)  Sepsis 2/2 Pneumonia, possible new lung cancer Presented meeting severe sepsis criteria with SIRS 3/4 and possible source pneumonia with new RLL opacity on CXR and new cough.  No other findings on history or exam to suggest alternative source for infection.  Additionally, with extensive tobacco history and significant family history of lung cancer, cannot rule out lung cancer at this time, especially given 25 pound weight loss over the past few months and hypocalcemia.  Once renal function able to tolerate, would obtain CT lung to evaluate for malignancy.  Tachycardic and with EKG showing sinus tach, elevated d-dimer, considered PE, although low risk with Wells' score 1.5 for tachycardia and otherwise reasonable explanation for tachycardia with sepsis with no shortness of breath or hypoxia.  Also with elevated troponin, though this is likely due to demand rather than true ischemia, EKG without ischemic findings.   - Admit to stepdown, attending Dr. Ardelia Mems  - d/c vanc, flagyl, cefepime - start CTX, azithro (9/24- ) - MRSA PCR - strep pneumo urinary antigen - continuous pulse ox - am BMP/CBC - CT lung when renal function tolerates - f/u blood cx  AMS, self resolved Self-resolved episode of confusion and flailing of extremities without loss of consciousness this am, likely 2/2 narcotic withdrawal as  wife notes similar episode with prior withdrawal about a month ago.  No prior history of seizures, is not on any anti-epileptics.  CT head with atrophy but without any acute abnormalities.  Is now back to mental baseline per patient and wife.  Low suspicion for true seizure, stroke, meningitis. - Monitor  AKI on CKD3 Cr 4.89, baseline (~1.3, last record in 2016).  Likely secondary to dehydration in the setting of diarrhea due to opiate withdrawal, chronic NSAID use for chronic pain and history of poor p.o. intake. S/p 3L in ED, continues on maintenance IVF.  - continue mIVF - am BMP - Hold antihypertensives  HTN Initially hypotensive on presentation, 84/46, improved with IVF in ED to low normal, 110/52 on admission. On olmesartan 40mg  0.5-1 tablet daily at home. - hold home meds in setting of hypotension  HLD On atorvastatin 10mg  at home. - continue statin   CAD Remote cath by Dr. Karlene Lineman years ago revealed 1 occluded coronary artery with collaterals, medical therapy was recommended.  Normal stress echo 11/2012.  Denies chest pain, shortness of breath, nausea, vomiting.  On ASA 81mg , statin, ARB at home.  Last saw cardiology in 2016. - Continue home meds  PAD Occluded popliteal artery in LLE s/p balloon angioplasty and stenting x3 separate occasions in 2016. On ASA 81mg , atorvastatin at home. Previously on brilinta and pletal but has not had in years, appears to be discontinued by vascular surgery in 2017.  Follows with cardiology, although last office visit was in 2016.  Continues to have claudication. 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  S/p L3-L4 fusion. On oxycodone 10mg  q6h prn, Xtampza ER 13.5mg  q12h, gabapentin, ibuprofen 400mg  daily at home.  Has been on narcotics for about 20 years. Wife reports patient takes more often than prescribed and will run out about 2-3  days before due. Does have withdrawal symptoms with diarrhea when this happens and often gets dehydrated.   - Continue OxyContin 10 mg q12h based on formulary - can consider restarting home prn oxycodone once BP able to tolerate. - continue gabapentin - hold ibuprofen in setting of AKI - miralax prn  Allergies - continue home loratadine  FEN/GI: Heart healthy, mIVF Prophylaxis: lovenox  Disposition: step-down, attending Dr. Ardelia Mems  History of Present Illness:  Philip Hurst is a 78 y.o. male presenting with episode of AMS earlier this morning.  Wife reports he woke up around 6 AM and stated he could not move, began flailing his arms and legs and beating his hands on the side of the mattress.  She noted he was confused and not making sense.  He went back to bed and woke up again around 7 AM and began doing the same thing.  She went to help him get up to go to the bathroom but she could not lift him and he became incontinent.  She then called her daughter and next-door neighbor to help him get dressed into the bathroom but had difficulties with this so they called EMS.  Patient states he is confused about the events that happened this morning and is not really sure what brought him here.  Patient notes he has been feeling "off" for the past month with no real specific symptoms.  Does have chronic narcotic use for chronic back pain and frequently runs out of medications before obtaining refills and wife notes he had a similar episode about a month ago when he ran out of his medications.  He ran out of his medicines on Monday and went through what wife describes as his typical withdrawal symptoms before he was able to get his medication yesterday.  His typical withdrawal symptoms include confusion, diarrhea, pain.  Wife does note that he has been keeping to himself more over the past month or 2.  Does have some fluctuation in weight per patient and wife but has noticed a 25 pound weight loss over the  last 3 months with inability to regain.  Wife notes he does not eat very much and often becomes dehydrated during his narcotics withdrawals.  Last dose of oxycodone was last night.  Does have a significant former smoking history, about 40 pack years, and currently dips for the past 10 years with wife noting sometimes multiple packets per day.  Has strong family history of lung cancer, losing at least 2 brothers to lung cancer.  Denies blood in stool or dark tarry stools or changes in bowel habits.  Denies gross blood in urine.  Was on prednisone about 2 weeks ago for poison ivy, has since finished course.  Does report nonproductive cough over the past few days but denies chest pain, difficulties breathing, orthopnea, PND, congestion, sore throat, sputum production.  Denies fevers but has not checked temperature at home, notably was febrile to 100.7 in ED.  Normally independent in ADLs, does have some difficulty with walking due to pain.  He can walk to the mailbox and brief trips to the grocery store, but wife notices he shuffles.  He does not walk with a walker or cane.  Does have a history of falls.  Wife reports no history of memory issues.  Review Of Systems: Per HPI with the following additions:   Review of Systems  Constitutional: Positive for weight loss. Negative for chills and fever.  HENT: Negative for congestion and sore throat.   Eyes: Negative for blurred vision and double vision.  Respiratory: Positive for cough. Negative for hemoptysis, sputum production and shortness of breath.   Cardiovascular: Positive for chest pain and claudication. Negative for palpitations, orthopnea and PND.  Gastrointestinal: Negative for abdominal pain, blood in stool, constipation, diarrhea, melena, nausea and vomiting.  Genitourinary: Negative for dysuria, frequency and hematuria.  Musculoskeletal: Positive for back pain and falls.  Neurological: Negative for dizziness.  Psychiatric/Behavioral: Negative  for hallucinations, memory loss and suicidal ideas.    Patient Active Problem List   Diagnosis Date Noted  . PAD (peripheral artery disease) ([place]) 08/19/2013  . Claudication of left lower extremity, ABI on Lt now  .80 or less down from 1.0 in 01/2013 01/30/2013  . Normal cardiac stress test, No ischemia 12/15/12 01/30/2013  . Unspecified deficiency anemia 09/05/2012  . Hypertension 09/12/2011  . CAD (coronary artery disease), single vessel disease with collaterals, medical therapy 09/12/2011  . Hyperlipidemia 09/12/2011    Past Medical History: Past Medical History:  Diagnosis Date  . Arthritis    "back, joints in right hand" (08/18/2013)  . CAD (coronary artery disease), single vessel disease with collaterals, medical therapy 09/12/2011  . Chronic lower back pain   . Claudication of left lower extremity, ABI Lt 0.52 -lifestyle limiting  ABI rt 0.90 01/30/2013  . Coronary artery disease    OCCLUDED RCA  . Hyperlipidemia   . Hypertension   . Normal cardiac stress test, No ischemia 12/15/12 01/30/2013  . PAD (peripheral artery disease) ([place])     Past Surgical History: Past Surgical History:  Procedure Laterality Date  . BACK SURGERY    . CARDIAC CATHETERIZATION  @2002    1 vessel disease w/collaterals,medical therapy  . CARDIOVASCULAR STRESS TEST  12/07/2008   EF 58%, NO ISCHEMIA  . CATARACT EXTRACTION W/ INTRAOCULAR LENS  IMPLANT, BILATERAL Bilateral   . 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 lower extremities arterial doppler  02/16/2013   bilateral ABIs normal values w/o evidence arterial insuff. at rest; left  lower extrem. norm. left popliteal artery stent norm. patency  . LOWER EXTREMITY ANGIOGRAM Left 08/18/2013   Procedure: LOWER EXTREMITY ANGIOGRAM;  Surgeon: Lorretta Harp, MD;  Location: [facility] CATH LAB;  Service: Cardiovascular;  Laterality: Left;  . LUMBAR DISC SURGERY  1990's   "herniated disc" (08/18/2013)  . PERCUTANEOUS STENT INTERVENTION   01/30/2013   Procedure: PERCUTANEOUS STENT INTERVENTION;  Surgeon: Lorretta Harp, MD;  Location: [facility] CATH LAB;  Service: Cardiovascular;;  . PERIPHERAL VASCULAR CATHETERIZATION N/A 09/05/2015   Procedure: Lower Extremity Angiography;  Surgeon: Lorretta Harp, MD;  Location: [facility] CV LAB;  Service: Cardiovascular;  Laterality: N/A;  . POPLITEAL ARTERY STENT Left 01/30/13  . POPLITEAL ARTERY STENT Left 10/'28/2014   successful Viabahn covered stent placement within the previously placed IDEV  stents/notes 08/18/2013  . POSTERIOR LUMBAR FUSION  1990's  . TONSILLECTOMY    . WRIST FUSION Right 1990's?   "first OR didn't correct problem" (08/18/2013)    Social History: Social History   Tobacco Use  . Smoking status: Former Smoker    Packs/day: 1.00    Years: 44.00    Pack years: 44.00  Types: Cigarettes    Quit date: 08/26/1997    Years since quitting: 21.9  . Smokeless tobacco: Current User    Types: Chew  Substance Use Topics  . Alcohol use: Yes    Comment: 08/18/2013 "glass of wine 5-6 months ago; very rare"  . Drug use: No   Additional social history: lives with wife  Please also refer to relevant sections of EMR.  Family History: Family History  Problem Relation Age of Onset  . Seizures Mother   . Cancer Father   . Coronary artery disease Brother   . Diabetes Brother    Allergies and Medications: Allergies  Allergen Reactions  . Plavix [Clopidogrel Bisulfate] Itching and Rash   No current facility-administered medications on file prior to encounter.    Current Outpatient Medications on File Prior to Encounter  Medication Sig Dispense Refill  . aspirin EC 81 MG EC tablet Take 1 tablet (81 mg total) by mouth daily. 30 tablet 0  . atorvastatin (LIPITOR) 10 MG tablet Take 10 mg by mouth daily.  0  . BRILINTA 90 MG TABS tablet TAKE 1 TABLET BY MOUTH TWICE DAILY (Patient taking differently: TAKE 90 MG BY MOUTH TWICE DAILY) 60 tablet 10  . cilostazol (PLETAL) 50  MG tablet Take 1 tablet (50 mg total) by mouth 2 (two) times daily. 90 tablet 3  . hydrochlorothiazide (HYDRODIURIL) 25 MG tablet Take 25 mg by mouth daily.    Marland Kitchen HYDROcodone-acetaminophen (NORCO) 10-325 MG per tablet Take 1 tablet by mouth every 4 (four) hours as needed for pain.     Marland Kitchen ketoconazole (NIZORAL) 2 % shampoo Apply 1 application topically 2 (two) times a week.    . losartan (COZAAR) 100 MG tablet Take 100 mg by mouth daily.      Objective: BP (!) 110/52   Pulse (!) 127   Temp 100.1 F (37.8 C) (Oral)   Resp 14   Ht 5\' 3"  (1.6 m)   Wt 77.1 kg   SpO2 100%   BMI 30.11 kg/m  Exam: General: pleasant elderly male, lying in bed, in NAD Eyes: PERRL ENTM: MMM, oropharynx clear without exudate or erythema Neck: full ROM w/o pain Cardiovascular: RRR, no M/R/G, no LE edema Respiratory: CTAB with rales in RLL.  Normal work of breathing and appropriately saturated on room air. Gastrointestinal: Soft, NTND, +BS. MSK: 5/5 in U/LE bilaterally. Derm: few excoriations along upper arms (related to prior poison ivy), none appearing infected. No visible wounds. Neuro: A&Ox3, speech normal Psych: mood and affect euthymic.  Labs and Imaging: CBC BMET  Recent Labs  Lab 07/16/19 0909  WBC 19.8*  HGB 11.7*  HCT 35.5*  PLT 163   Recent Labs  Lab 07/16/19 0909  NA 135  K 4.0  CL 104  CO2 14*  BUN 93*  CREATININE 4.89*  GLUCOSE 103*  CALCIUM 7.5*     EKG: sinus tach  Ct Head Wo Contrast  Result Date: 07/16/2019 CLINICAL DATA:  Seizure-like activity and confusion EXAM: CT HEAD WITHOUT CONTRAST TECHNIQUE: Contiguous axial images were obtained from the base of the skull through the vertex without intravenous contrast. COMPARISON:  None. FINDINGS: Brain: There is moderate diffuse atrophy with atrophy most notable in the left parietal region. There is no intracranial mass, hemorrhage, extra-axial fluid collection, or midline shift. There is small vessel disease scattered throughout  the centra semiovale bilaterally. No acute appearing infarct is demonstrable on this study. Vascular: There is no hyperdense vessel. There is calcification in each  carotid siphon region. Skull: Bony calvarium appears intact. Sinuses/Orbits: There is mucosal thickening in several ethmoid air cells. Other visualized paranasal sinuses are clear. Orbits appear symmetric bilaterally. Other: There is opacification in several inferior mastoid air cells on the left. Mastoids elsewhere bilaterally are clear. IMPRESSION: Atrophy with supratentorial small vessel disease throughout portions of the periventricular white matter. No acute infarct is demonstrable. No evident mass or hemorrhage. Foci of arterial vascular calcification noted. Foci of paranasal sinus disease present. Inferior mastoid air cell disease on the left. Electronically Signed   By: Lowella Grip III M.D.   On: 07/16/2019 10:49   Dg Chest Portable 1 View  Result Date: 07/16/2019 CLINICAL DATA:  Cough, febrile EXAM: PORTABLE CHEST 1 VIEW COMPARISON:  August 31, 2015 FINDINGS: There is mild cardiomegaly. Patchy airspace opacity seen in the right lower lung. The left lung is clear. IMPRESSION: Patchy airspace opacity in the right lower lung which could be due to early infectious etiology or atelectasis. Electronically Signed   By: Prudencio Pair M.D.   On: 07/16/2019 09:55   Rory Percy, DO 07/16/2019, 11:43 AM PGY-3, [facility] pager: [phone], text pages welcome

## 2019-07-16 NOTE — ED Triage Notes (Addendum)
Pt from home via [facility] for seizure like activity and confusion that started at 0530 today, second time seizure like activity 0700 today. Per EMS wife told them pt was "thrashing around in bed." He does not have a hx of seizures. Pt A&Ox3 to person, place, situation, normally A&Ox4. Per EMS initail bp 80/50. EMS gave NS 261ml, which brought bp up. Pt sinus tach on monitor

## 2019-07-16 NOTE — ED Notes (Signed)
Patient transported to CT 

## 2019-07-16 NOTE — Progress Notes (Signed)
Pharmacy Antibiotic Note   Philip Hurst is a 78 y.o. male admitted on 07/16/2019 with sepsis.  Pharmacy has been consulted for Vancomycin and Cefepime dosing.  Plan: - Cefepime 2000 mg q 24hr  - Vancomycin 1500 mg x 1 dose  - Would monitor renal function and likely obtain a random level in 36- 48 hours depending on renal function.   Height: 5\' 3"  (160 cm) Weight: 170 lb (77.1 kg) IBW/kg (Calculated) : 56.9  Temp (24hrs), Avg:100.1 F (37.8 C), Min:100.1 F (37.8 C), Max:100.1 F (37.8 C)  Recent Labs  Lab 07/16/19 0909  WBC 19.8*  CREATININE 4.89*    Estimated Creatinine Clearance: 11.4 mL/min (A) (by C-G formula based on SCr of 4.89 mg/dL (H)).    Allergies  Allergen Reactions  . Plavix [Clopidogrel Bisulfate] Itching and Rash       Microbiology results: 9/24 BCx: Pending       Thank you for allowing pharmacy to be a part of this patient's care.  Duanne Limerick PharmD. BCPS  07/16/2019 10:16 AM

## 2019-07-16 NOTE — ED Provider Notes (Signed)
[facility] EMERGENCY DEPARTMENT Provider Note   CSN: [id number] Arrival date & time: 07/16/19  [id number]     History   Chief Complaint Chief Complaint  Patient presents with  . Seizures    HPI DANNY CELIS is a 78 y.o. male with a past medical history of CAD, PAD, hypertension, hyperlipidemia, who presents to ED for seizure-like activity and confusion that began approximately 3.5 hours ago.  Majority of history is provided by EMS reports from his wife.  Wife told EMS that patient was "thrashing around in bed."  This happened again approximately 2 hours ago.  He does not have a history of seizures.  Unsure if he had any head trauma.  Patient states that he is unsure why he is here.  States that "I feel fine."  Does note that he has having pain in his left lower back that "has been there ever since you are probably born."  He states he has been ambulatory, denies any headache, vision changes, numbness in arms or legs, chest pain, abdominal pain, vomiting.     HPI  Past Medical History:  Diagnosis Date  . Arthritis    "back, joints in right hand" (08/18/2013)  . CAD (coronary artery disease), single vessel disease with collaterals, medical therapy 09/12/2011  . Chronic lower back pain   . Claudication of left lower extremity, ABI Lt 0.52 -lifestyle limiting  ABI rt 0.90 01/30/2013  . Coronary artery disease    OCCLUDED RCA  . Hyperlipidemia   . Hypertension   . Normal cardiac stress test, No ischemia 12/15/12 01/30/2013  . PAD (peripheral artery disease) [facility])     Patient Active Problem List   Diagnosis Date Noted  . PAD (peripheral artery disease) ([place]) 08/19/2013  . Claudication of left lower extremity, ABI on Lt now  .80 or less down from 1.0 in 01/2013 01/30/2013  . Normal cardiac stress test, No ischemia 12/15/12 01/30/2013  . Unspecified deficiency anemia 09/05/2012  . Hypertension 09/12/2011  . CAD (coronary artery disease), single vessel disease with  collaterals, medical therapy 09/12/2011  . Hyperlipidemia 09/12/2011    Past Surgical History:  Procedure Laterality Date  . BACK SURGERY    . CARDIAC CATHETERIZATION  @2002    1 vessel disease w/collaterals,medical therapy  . CARDIOVASCULAR STRESS TEST  12/07/2008   EF 58%, NO ISCHEMIA  . CATARACT EXTRACTION W/ INTRAOCULAR LENS  IMPLANT, BILATERAL Bilateral   . 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 lower extremities arterial doppler  02/16/2013   bilateral ABIs normal values w/o evidence arterial insuff. at rest; left  lower extrem. norm. left popliteal artery stent norm. patency  . LOWER EXTREMITY ANGIOGRAM Left 08/18/2013   Procedure: LOWER EXTREMITY ANGIOGRAM;  Surgeon: Lorretta Harp, MD;  Location: [facility] CATH LAB;  Service: Cardiovascular;  Laterality: Left;  . LUMBAR DISC SURGERY  1990's   "herniated disc" (08/18/2013)  . PERCUTANEOUS STENT INTERVENTION  01/30/2013   Procedure: PERCUTANEOUS STENT INTERVENTION;  Surgeon: Lorretta Harp, MD;  Location: [facility] CATH LAB;  Service: Cardiovascular;;  . PERIPHERAL VASCULAR CATHETERIZATION N/A 09/05/2015   Procedure: Lower Extremity Angiography;  Surgeon: Lorretta Harp, MD;  Location: [facility] CV LAB;  Service: Cardiovascular;  Laterality: N/A;  . POPLITEAL ARTERY STENT Left 01/30/13  . POPLITEAL ARTERY STENT Left 10/'28/2014   successful Viabahn covered stent placement within the previously placed IDEV  stents/notes 08/18/2013  . POSTERIOR LUMBAR FUSION  1990's  . TONSILLECTOMY    .  WRIST FUSION Right 1990's?   "first OR didn't correct problem" (08/18/2013)        Home Medications    Prior to Admission medications   Medication Sig Start Date End Date Taking? Authorizing Provider  aspirin EC 81 MG EC tablet Take 1 tablet (81 mg total) by mouth daily. 08/19/13   Brett Canales, PA-C  atorvastatin (LIPITOR) 10 MG tablet Take 10 mg by mouth daily. 07/20/15   [provider]  BRILINTA 90 MG TABS  tablet TAKE 1 TABLET BY MOUTH TWICE DAILY Patient taking differently: TAKE 90 MG BY MOUTH TWICE DAILY 01/03/15   Lorretta Harp, MD  cilostazol (PLETAL) 50 MG tablet Take 1 tablet (50 mg total) by mouth 2 (two) times daily. 09/21/15   Lorretta Harp, MD  hydrochlorothiazide (HYDRODIURIL) 25 MG tablet Take 25 mg by mouth daily.    [provider]  HYDROcodone-acetaminophen (NORCO) 10-325 MG per tablet Take 1 tablet by mouth every 4 (four) hours as needed for pain.     [provider]  ketoconazole (NIZORAL) 2 % shampoo Apply 1 application topically 2 (two) times a week.    [provider]  losartan (COZAAR) 100 MG tablet Take 100 mg by mouth daily.    [provider]    Family History Family History  Problem Relation Age of Onset  . Seizures Mother   . Cancer Father   . Coronary artery disease Brother   . Diabetes Brother     Social History Social History   Tobacco Use  . Smoking status: Former Smoker    Packs/day: 1.00    Years: 44.00    Pack years: 44.00    Types: Cigarettes    Quit date: 08/26/1997    Years since quitting: 21.9  . Smokeless tobacco: Current User    Types: Chew  Substance Use Topics  . Alcohol use: Yes    Comment: 08/18/2013 "glass of wine 5-6 months ago; very rare"  . Drug use: No     Allergies   Plavix [clopidogrel bisulfate]   Review of Systems Review of Systems  Unable to perform ROS: Mental status change  Cardiovascular: Negative for chest pain.  Gastrointestinal: Negative for abdominal pain and vomiting.  Neurological: Positive for seizures (?). Negative for numbness and headaches.     Physical Exam Updated Vital Signs BP (!) 124/48   Pulse (!) 108   Temp 100.1 F (37.8 C) (Oral)   Resp 14   Ht 5\' 3"  (1.6 m)   Wt 77.1 kg   SpO2 100%   BMI 30.11 kg/m   Physical Exam Vitals signs and nursing note reviewed.  Constitutional:      General: He is not in acute distress.    Appearance: He is  well-developed.  HENT:     Head: Normocephalic and atraumatic.     Nose: Nose normal.  Eyes:     General: No scleral icterus.       Right eye: No discharge.        Left eye: No discharge.     Conjunctiva/sclera: Conjunctivae normal.     Pupils: Pupils are equal, round, and reactive to light.  Neck:     Musculoskeletal: Normal range of motion and neck supple.  Cardiovascular:     Rate and Rhythm: Normal rate and regular rhythm.     Heart sounds: Normal heart sounds. No murmur. No friction rub. No gallop.   Pulmonary:     Effort: Pulmonary effort is  normal. No respiratory distress.     Breath sounds: Normal breath sounds.  Abdominal:     General: Bowel sounds are normal. There is no distension.     Palpations: Abdomen is soft.     Tenderness: There is no abdominal tenderness. There is no guarding.  Musculoskeletal: Normal range of motion.  Skin:    General: Skin is warm and dry.     Findings: No rash.  Neurological:     Mental Status: He is alert.     Motor: No abnormal muscle tone.     Coordination: Coordination normal.     Comments: Alert to self, place, situation and current events.  Believes it is October 2021.  Able to name his wife and his own birthday.  No gross facial asymmetry noted. Pupils reactive.  Cranial nerves appear grossly intact. Sensation intact to light touch on face, BUE and BLE. Strength 5/5 in BUE and BLE. No pronator drift.      ED Treatments / Results  Labs (all labs ordered are listed, but only abnormal results are displayed) Labs Reviewed  COMPREHENSIVE METABOLIC PANEL - Abnormal; Notable for the following components:      Result Value   CO2 14 (*)    Glucose, Bld 103 (*)    BUN 93 (*)    Creatinine, Ser 4.89 (*)    Calcium 7.5 (*)    Total Protein 5.7 (*)    Albumin 3.1 (*)    Total Bilirubin 0.1 (*)    GFR calc non Af Amer 11 (*)    GFR calc Af Amer 12 (*)    Anion gap 17 (*)    All other components within normal limits  CBC WITH  DIFFERENTIAL/PLATELET - Abnormal; Notable for the following components:   WBC 19.8 (*)    RBC 3.63 (*)    Hemoglobin 11.7 (*)    HCT 35.5 (*)    Neutro Abs 17.8 (*)    Abs Immature Granulocytes 0.14 (*)    All other components within normal limits  URINALYSIS, ROUTINE W REFLEX MICROSCOPIC - Abnormal; Notable for the following components:   Hgb urine dipstick MODERATE (*)    Protein, ur 30 (*)    Bacteria, UA RARE (*)    All other components within normal limits  D-DIMER, QUANTITATIVE (NOT AT [facility]) - Abnormal; Notable for the following components:   D-Dimer, Quant 1.38 (*)    All other components within normal limits  TROPONIN I (HIGH SENSITIVITY) - Abnormal; Notable for the following components:   Troponin I (High Sensitivity) 56 (*)    All other components within normal limits  SARS CORONAVIRUS 2 (HOSPITAL ORDER, [place] LAB)  URINE CULTURE  CULTURE, BLOOD (ROUTINE X 2)  LACTIC ACID, PLASMA  LACTIC ACID, PLASMA  CBG MONITORING, ED  TROPONIN I (HIGH SENSITIVITY)    EKG EKG Interpretation  Date/Time:  Thursday July 16 2019 08:42:23 EDT Ventricular Rate:  105 PR Interval:    QRS Duration: 91 QT Interval:  336 QTC Calculation: 444 R Axis:   74 Text Interpretation:  Sinus tachycardia Atrial premature complex No STEMI Confirmed by Octaviano Glow [phone]) on 07/16/2019 9:53:36 AM   Radiology Ct Head Wo Contrast  Result Date: 07/16/2019 CLINICAL DATA:  Seizure-like activity and confusion EXAM: CT HEAD WITHOUT CONTRAST TECHNIQUE: Contiguous axial images were obtained from the base of the skull through the vertex without intravenous contrast. COMPARISON:  None. FINDINGS: Brain: There is moderate diffuse atrophy with atrophy most  notable in the left parietal region. There is no intracranial mass, hemorrhage, extra-axial fluid collection, or midline shift. There is small vessel disease scattered throughout the centra semiovale bilaterally. No acute  appearing infarct is demonstrable on this study. Vascular: There is no hyperdense vessel. There is calcification in each carotid siphon region. Skull: Bony calvarium appears intact. Sinuses/Orbits: There is mucosal thickening in several ethmoid air cells. Other visualized paranasal sinuses are clear. Orbits appear symmetric bilaterally. Other: There is opacification in several inferior mastoid air cells on the left. Mastoids elsewhere bilaterally are clear. IMPRESSION: Atrophy with supratentorial small vessel disease throughout portions of the periventricular white matter. No acute infarct is demonstrable. No evident mass or hemorrhage. Foci of arterial vascular calcification noted. Foci of paranasal sinus disease present. Inferior mastoid air cell disease on the left. Electronically Signed   By: Lowella Grip III M.D.   On: 07/16/2019 10:49   Dg Chest Portable 1 View  Result Date: 07/16/2019 CLINICAL DATA:  Cough, febrile EXAM: PORTABLE CHEST 1 VIEW COMPARISON:  August 31, 2015 FINDINGS: There is mild cardiomegaly. Patchy airspace opacity seen in the right lower lung. The left lung is clear. IMPRESSION: Patchy airspace opacity in the right lower lung which could be due to early infectious etiology or atelectasis. Electronically Signed   By: Prudencio Pair M.D.   On: 07/16/2019 09:55    Procedures .Critical Care Performed by: Delia Heady, PA-C Authorized by: Delia Heady, PA-C   Critical care provider statement:    Critical care time (minutes):  35   Critical care was necessary to treat or prevent imminent or life-threatening deterioration of the following conditions:  Cardiac failure, renal failure, respiratory failure, sepsis and dehydration   Critical care was time spent personally by me on the following activities:  Development of treatment plan with patient or surrogate, discussions with consultants, discussions with primary provider, evaluation of patient's response to treatment, examination  of patient, pulse oximetry, re-evaluation of patient's condition, review of old charts, ordering and review of laboratory studies and ordering and review of radiographic studies   I assumed direction of critical care for this patient from another provider in my specialty: no     (including critical care time)  Medications Ordered in ED Medications  metroNIDAZOLE (FLAGYL) IVPB 500 mg (has no administration in time range)  vancomycin (VANCOCIN) 1,500 mg in sodium chloride 0.9 % 500 mL IVPB (1,500 mg Intravenous New Bag/Given 07/16/19 1047)  vancomycin variable dose per unstable renal function (pharmacist dosing) (has no administration in time range)  ceFEPIme (MAXIPIME) 2 g in sodium chloride 0.9 % 100 mL IVPB (has no administration in time range)  metroNIDAZOLE (FLAGYL) IVPB 500 mg (has no administration in time range)  ceFEPIme (MAXIPIME) 2 g in sodium chloride 0.9 % 100 mL IVPB (0 g Intravenous Stopped 07/16/19 1038)  sodium chloride 0.9 % bolus 2,313 mL (2,313 mLs Intravenous New Bag/Given 07/16/19 1000)  acetaminophen (TYLENOL) tablet 650 mg (650 mg Oral Given 07/16/19 1114)     Initial Impression / Assessment and Plan / ED Course  I have reviewed the triage vital signs and the nursing notes.  Pertinent labs & imaging results that were available during my care of the patient were reviewed by me and considered in my medical decision making (see chart for details).  Clinical Course as of Jul 15 1125  Thu Jul 16, 2019  0914 Patient seen by myself as well as PA provider.  Briefly this is a 78 year old gentleman reports he  was in his usual state of health until this morning.  Apparently his wife reported that he was having generalized shaking episode and confusion in the bed.  He does not remember this but does remember being helped out of bed by his wife and his neighbor in the morning.  He is currently asymptomatic except for a very mild headache.  He denies any chest pain lightheadedness or  shortness of breath.  He denies any history of heart failure or leg swelling or weight gain or orthopnea.  He denies any history of blood clots or unilateral leg swelling.  He denies any fevers, abdominal pain, diarrhea.  He states he drinks very little water and generally drinks tea.  My exam the patient is fully oriented and mentating well.  He is hypotensive in the room with a blood pressure of 70/60, IV fluids are being hung right now.  He is mildly tachycardic with heart rate of 100s.  He had a temp of 100.1.  The differential is broad and includes orthostatic hypotension from dehydration versus sepsis (we have no localizing source at this time) versus cardiogenic shock versus pulmonary embolism.  Will obtain labs and EKG and a stat CT head.  Will give a liter of fluids and reassess his blood pressure.  We will also attempt to reach out to his wife.   [MT]  [phone] With leukocytosis, this raises my concern for sepsis, will initiate broad-spectrum antibiotics.   [MT]  0950 WBC(!): 19.8 [HK]  1003 BP now 105/89, HR in 90's after 1L IVF   [MT]  1017 Additional information provided by wife at bedside.  States that he had 2 episodes this morning where he was in bed, felt like he wanted to get up but was unable to.  States that he then started flapping his left hand.  She called her neighbor, they were able to help him to the bathroom but "it felt like he was dead weight."  States that he had a similar episode about 2 months ago.  She does note that he has been taking his oxycodone 10 mg every 6 hours and this month he ran out of his prescription about 4 days before and needed to be refilled.  He is currently in pain management for his degenerative disc disease.  States that he has been taking narcotics for the past 20 years.  He used to be on hydrocodone and now was switched to oxycodone.  It appears that he went through some withdrawal symptoms this weekend including vomiting.  This improved after he got his  prescription refilled 4 days ago.  States that he did not have any head trauma and did not fall from the bed today.  She did note that he was holding his head last night as a signaling he had a headache. She believed last time it was due to dehydration as she states that he does not drink much water and usually just drinks tea daily (3 tea bags in one glass of water).   [HK]  1022 Creatinine(!): 4.89 [HK]  1024 Creatinine(!): 4.89 [MT]  1024 BUN(!): 93 [MT]  1117 Given the findings of pneumonia on chest x-ray, my suspicion for pulmonary embolism is lower now.  His work-up is consistent with sepsis.  He is kidney function is also poor would not tolerate IV contrast for stat CT PE study.  We discussed with the hospitalist to pursue a V/Q study if they wish, however the patient appears stable here, is not hypoxic,  his blood pressures improved with IV fluids.  I do not believe he needs that PE imaging at this time.   [MT]  1125 SARS Coronavirus 2: NEGATIVE [HK]  [id number] Patient to be admitted to medicine service for acute renal failure, but appears to be pneumonia, sepsis.  Blood pressure improved to [id number] systolic.   [HK]    Clinical Course User Index [HK] Delia Heady, PA-C [MT] Langston Masker Carola Rhine, MD       Final Clinical Impressions(s) / ED Diagnoses   Final diagnoses:  Community acquired pneumonia of right lung, unspecified part of lung  Sepsis, due to unspecified organism, unspecified whether acute organ dysfunction present [facility])  AKI (acute kidney injury) [facility])    ED Discharge Orders    None       Delia Heady, PA-C 07/16/19 1138    Wyvonnia Dusky, MD 07/16/19 1700

## 2019-07-16 NOTE — Progress Notes (Signed)
Family medicine MD notified that pt's last troponin was 129. Will continue to monitor pt.

## 2019-07-16 NOTE — ED Notes (Signed)
ED TO INPATIENT HANDOFF REPORT  ED Nurse Name and Phone #: [phone]  S Name/Age/Gender Philip Hurst 78 y.o. male Room/Bed: 030C/030C  Code Status   Code Status: Full Code  Home/SNF/Other Home Patient oriented to: self Is this baseline? No   Triage Complete: Triage complete  Chief Complaint sz  Triage Note Pt from home via [facility] for seizure like activity and confusion that started at 0530 today, second time seizure like activity 0700 today. Per EMS wife told them pt was "thrashing around in bed." He does not have a hx of seizures. Pt A&Ox3 to person, place, situation, normally A&Ox4. Per EMS initail bp 80/50. EMS gave NS 256ml, which brought bp up. Pt sinus tach on monitor   Allergies Allergies  Allergen Reactions  . Plavix [Clopidogrel Bisulfate] Itching and Rash    Level of Care/Admitting Diagnosis ED Disposition    ED Disposition Condition [facility] Area: [place] [[id number]]  Level of Care: Telemetry Medical [104]  Covid Evaluation: Confirmed COVID Negative  Diagnosis: Pneumonia [id number]  Admitting Physician: Rory Percy [id number]  Attending Physician: Leeanne Rio [phone]  Estimated length of stay: past midnight tomorrow  Certification:: I certify this patient will need inpatient services for at least 2 midnights  PT Class (Do Not Modify): Inpatient [101]  PT Acc Code (Do Not Modify): Private [1]       B Medical/Surgery History Past Medical History:  Diagnosis Date  . Arthritis    "back, joints in right hand" (08/18/2013)  . CAD (coronary artery disease), single vessel disease with collaterals, medical therapy 09/12/2011  . Chronic lower back pain   . Claudication of left lower extremity, ABI Lt 0.52 -lifestyle limiting  ABI rt 0.90 01/30/2013  . Coronary artery disease    OCCLUDED RCA  . Hyperlipidemia   . Hypertension   . Normal cardiac stress test, No ischemia 12/15/12 01/30/2013  . PAD (peripheral artery  disease) ([place])    Past Surgical History:  Procedure Laterality Date  . BACK SURGERY    . CARDIAC CATHETERIZATION  @2002    1 vessel disease w/collaterals,medical therapy  . CARDIOVASCULAR STRESS TEST  12/07/2008   EF 58%, NO ISCHEMIA  . CATARACT EXTRACTION W/ INTRAOCULAR LENS  IMPLANT, BILATERAL Bilateral   . 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 lower extremities arterial doppler  02/16/2013   bilateral ABIs normal values w/o evidence arterial insuff. at rest; left  lower extrem. norm. left popliteal artery stent norm. patency  . LOWER EXTREMITY ANGIOGRAM Left 08/18/2013   Procedure: LOWER EXTREMITY ANGIOGRAM;  Surgeon: Lorretta Harp, MD;  Location: [facility] CATH LAB;  Service: Cardiovascular;  Laterality: Left;  . LUMBAR DISC SURGERY  1990's   "herniated disc" (08/18/2013)  . PERCUTANEOUS STENT INTERVENTION  01/30/2013   Procedure: PERCUTANEOUS STENT INTERVENTION;  Surgeon: Lorretta Harp, MD;  Location: [facility] CATH LAB;  Service: Cardiovascular;;  . PERIPHERAL VASCULAR CATHETERIZATION N/A 09/05/2015   Procedure: Lower Extremity Angiography;  Surgeon: Lorretta Harp, MD;  Location: [place] CV LAB;  Service: Cardiovascular;  Laterality: N/A;  . POPLITEAL ARTERY STENT Left 01/30/13  . POPLITEAL ARTERY STENT Left 10/'28/2014   successful Viabahn covered stent placement within the previously placed IDEV  stents/notes 08/18/2013  . POSTERIOR LUMBAR FUSION  1990's  . TONSILLECTOMY    . WRIST FUSION Right 1990's?   "first OR didn't correct problem" (08/18/2013)     A IV Location/Drains/Wounds Patient Lines/Drains/Airways Status  Active Line/Drains/Airways    Name:   Placement date:   Placement time:   Site:   Days:   Peripheral IV 07/16/19 Anterior;Right Forearm   07/16/19    -    Forearm   less than 1          Intake/Output Last 24 hours  Intake/Output Summary (Last 24 hours) at 07/16/2019 1618 Last data filed at 07/16/2019 1544 Gross per 24 hour   Intake 4861.15 ml  Output 575 ml  Net 4286.15 ml    Labs/Imaging Results for orders placed or performed during the hospital encounter of 07/16/19 (from the past 48 hour(s))  Comprehensive metabolic panel     Status: Abnormal   Collection Time: 07/16/19  9:09 AM  Result Value Ref Range   Sodium 135 135 - 145 mmol/L   Potassium 4.0 3.5 - 5.1 mmol/L   Chloride 104 98 - 111 mmol/L   CO2 14 (L) 22 - 32 mmol/L   Glucose, Bld 103 (H) 70 - 99 mg/dL   BUN 93 (H) 8 - 23 mg/dL   Creatinine, Ser 4.89 (H) 0.61 - 1.24 mg/dL   Calcium 7.5 (L) 8.9 - 10.3 mg/dL   Total Protein 5.7 (L) 6.5 - 8.1 g/dL   Albumin 3.1 (L) 3.5 - 5.0 g/dL   AST 25 15 - 41 U/L   ALT 22 0 - 44 U/L   Alkaline Phosphatase 92 38 - 126 U/L   Total Bilirubin 0.1 (L) 0.3 - 1.2 mg/dL   GFR calc non Af Amer 11 (L) >60 mL/min   GFR calc Af Amer 12 (L) >60 mL/min   Anion gap 17 (H) 5 - 15    Comment: Performed at [facility], [address] [place], [place] [address]  CBC WITH DIFFERENTIAL     Status: Abnormal   Collection Time: 07/16/19  9:09 AM  Result Value Ref Range   WBC 19.8 (H) 4.0 - 10.5 K/uL   RBC 3.63 (L) 4.22 - 5.81 MIL/uL   Hemoglobin 11.7 (L) 13.0 - 17.0 g/dL   HCT 35.5 (L) 39.0 - 52.0 %   MCV 97.8 80.0 - 100.0 fL   MCH 32.2 26.0 - 34.0 pg   MCHC 33.0 30.0 - 36.0 g/dL   RDW 14.1 11.5 - 15.5 %   Platelets 163 150 - 400 K/uL   nRBC 0.0 0.0 - 0.2 %   Neutrophils Relative % 89 %   Neutro Abs 17.8 (H) 1.7 - 7.7 K/uL   Lymphocytes Relative 5 %   Lymphs Abs 0.9 0.7 - 4.0 K/uL   Monocytes Relative 4 %   Monocytes Absolute 0.8 0.1 - 1.0 K/uL   Eosinophils Relative 1 %   Eosinophils Absolute 0.1 0.0 - 0.5 K/uL   Basophils Relative 0 %   Basophils Absolute 0.0 0.0 - 0.1 K/uL   Immature Granulocytes 1 %   Abs Immature Granulocytes 0.14 (H) 0.00 - 0.07 K/uL    Comment: Performed at [facility], [address] [place], [place] [address]  CBG monitoring, ED     Status: None   Collection Time:  07/16/19  9:25 AM  Result Value Ref Range   Glucose-Capillary 88 70 - 99 mg/dL  Lactic acid, plasma     Status: None   Collection Time: 07/16/19  9:28 AM  Result Value Ref Range   Lactic Acid, Venous 0.8 0.5 - 1.9 mmol/L    Comment: Performed at [facility]  [address] [place], [place] [address]  D-dimer, quantitative (not at [facility])     Status: Abnormal   Collection Time: 07/16/19  9:28 AM  Result Value Ref Range   D-Dimer, Quant 1.38 (H) 0.00 - 0.50 ug/mL-FEU    Comment: (NOTE) At the manufacturer cut-off of 0.50 ug/mL FEU, this assay has been documented to exclude PE with a sensitivity and negative predictive value of 97 to 99%.  At this time, this assay has not been approved by the FDA to exclude DVT/VTE. Results should be correlated with clinical presentation. Performed at [facility], [place] [address], [place], [place] [address]   SARS Coronavirus 2 [facility] order, Performed in [facility] hospital lab) Nasopharyngeal Nasopharyngeal Swab     Status: None   Collection Time: 07/16/19  9:35 AM   Specimen: Nasopharyngeal Swab  Result Value Ref Range   SARS Coronavirus 2 NEGATIVE NEGATIVE    Comment: (NOTE) If result is NEGATIVE SARS-CoV-2 target nucleic acids are NOT DETECTED. The SARS-CoV-2 RNA is generally detectable in upper and lower  respiratory specimens during the acute phase of infection. The lowest  concentration of SARS-CoV-2 viral copies this assay can detect is 250  copies / mL. A negative result does not preclude SARS-CoV-2 infection  and should not be used as the sole basis for treatment or other  patient management decisions.  A negative result may occur with  improper specimen collection / handling, submission of specimen other  than nasopharyngeal swab, presence of viral mutation(s) within the  areas targeted by this assay, and inadequate number of viral copies  (<250 copies / mL). A negative result must be combined with clinical   observations, patient history, and epidemiological information. If result is POSITIVE SARS-CoV-2 target nucleic acids are DETECTED. The SARS-CoV-2 RNA is generally detectable in upper and lower  respiratory specimens dur ing the acute phase of infection.  Positive  results are indicative of active infection with SARS-CoV-2.  Clinical  correlation with patient history and other diagnostic information is  necessary to determine patient infection status.  Positive results do  not rule out bacterial infection or co-infection with other viruses. If result is PRESUMPTIVE POSTIVE SARS-CoV-2 nucleic acids MAY BE PRESENT.   A presumptive positive result was obtained on the submitted specimen  and confirmed on repeat testing.  While 2019 novel coronavirus  (SARS-CoV-2) nucleic acids may be present in the submitted sample  additional confirmatory testing may be necessary for epidemiological  and / or clinical management purposes  to differentiate between  SARS-CoV-2 and other Sarbecovirus currently known to infect humans.  If clinically indicated additional testing with an alternate test  methodology [phone]) is advised. The SARS-CoV-2 RNA is generally  detectable in upper and lower respiratory sp ecimens during the acute  phase of infection. The expected result is Negative. Fact Sheet for Patients:  StrictlyIdeas.no Fact Sheet for Healthcare Providers: BankingDealers.co.za This test is not yet approved or cleared by the [place] FDA and has been authorized for detection and/or diagnosis of SARS-CoV-2 by FDA under an Emergency Use Authorization (EUA).  This EUA will remain in effect (meaning this test can be used) for the duration of the COVID-19 declaration under Section 564(b)(1) of the Act, 21 U.S.C. section 360bbb-3(b)(1), unless the authorization is terminated or revoked sooner. Performed at [facility], [place] [address],  [place], [place] [address]   Troponin I (High Sensitivity)     Status: Abnormal   Collection Time: 07/16/19  9:44 AM  Result Value Ref Range   Troponin I (High Sensitivity) 56 (H) <18 ng/L    Comment: (NOTE) Elevated high sensitivity troponin I (hsTnI) values and significant  changes across serial measurements may suggest ACS but many other  chronic and acute conditions are known to elevate hsTnI results.  Refer to the "Links" section for chest pain algorithms and additional  guidance. Performed at [facility], [place] [address] [place], [place] [address]   Urinalysis, Routine w reflex microscopic     Status: Abnormal   Collection Time: 07/16/19  9:50 AM  Result Value Ref Range   Color, Urine YELLOW YELLOW   APPearance CLEAR CLEAR   Specific Gravity, Urine 1.014 1.005 - 1.030   pH 5.0 5.0 - 8.0   Glucose, UA NEGATIVE NEGATIVE mg/dL   Hgb urine dipstick MODERATE (A) NEGATIVE   Bilirubin Urine NEGATIVE NEGATIVE   Ketones, ur NEGATIVE NEGATIVE mg/dL   Protein, ur 30 (A) NEGATIVE mg/dL   Nitrite NEGATIVE NEGATIVE   Leukocytes,Ua NEGATIVE NEGATIVE   RBC / HPF 0-5 0 - 5 RBC/hpf   WBC, UA 6-10 0 - 5 WBC/hpf   Bacteria, UA RARE (A) NONE SEEN   Mucus PRESENT    Hyaline Casts, UA PRESENT     Comment: Performed at [facility] [address], [place], [place] [address]  Troponin I (High Sensitivity)     Status: Abnormal   Collection Time: 07/16/19 11:58 AM  Result Value Ref Range   Troponin I (High Sensitivity) 75 (H) <18 ng/L    Comment: (NOTE) Elevated high sensitivity troponin I (hsTnI) values and significant  changes across serial measurements may suggest ACS but many other  chronic and acute conditions are known to elevate hsTnI results.  Refer to the "Links" section for chest pain algorithms and additional  guidance. Performed at [facility], [place] [address], [place], [place] [address]    Ct Head Wo Contrast  Result Date: 07/16/2019 CLINICAL DATA:   Seizure-like activity and confusion EXAM: CT HEAD WITHOUT CONTRAST TECHNIQUE: Contiguous axial images were obtained from the base of the skull through the vertex without intravenous contrast. COMPARISON:  None. FINDINGS: Brain: There is moderate diffuse atrophy with atrophy most notable in the left parietal region. There is no intracranial mass, hemorrhage, extra-axial fluid collection, or midline shift. There is small vessel disease scattered throughout the centra semiovale bilaterally. No acute appearing infarct is demonstrable on this study. Vascular: There is no hyperdense vessel. There is calcification in each carotid siphon region. Skull: Bony calvarium appears intact. Sinuses/Orbits: There is mucosal thickening in several ethmoid air cells. Other visualized paranasal sinuses are clear. Orbits appear symmetric bilaterally. Other: There is opacification in several inferior mastoid air cells on the left. Mastoids elsewhere bilaterally are clear. IMPRESSION: Atrophy with supratentorial small vessel disease throughout portions of the periventricular white matter. No acute infarct is demonstrable. No evident mass or hemorrhage. Foci of arterial vascular calcification noted. Foci of paranasal sinus disease present. Inferior mastoid air cell disease on the left. Electronically Signed   By: Lowella Grip III M.D.   On: 07/16/2019 10:49   Dg Chest Portable 1 View  Result Date: 07/16/2019 CLINICAL DATA:  Cough, febrile EXAM: PORTABLE CHEST 1 VIEW COMPARISON:  August 31, 2015 FINDINGS: There is mild cardiomegaly. Patchy airspace opacity seen in the right lower lung. The left lung is clear. IMPRESSION: Patchy airspace opacity in the right lower lung which could be due to early infectious etiology or  atelectasis. Electronically Signed   By: Prudencio Pair M.D.   On: 07/16/2019 09:55    Pending Labs Unresulted Labs (From admission, onward)    Start     Ordered   07/23/19 0500  Creatinine, serum  (enoxaparin  (LOVENOX)    CrCl < 30 ml/min)  Weekly,   R    Comments: while on enoxaparin therapy.    07/16/19 1539   07/17/19 0500  Renal function panel  Tomorrow morning,   R     07/16/19 1347   07/17/19 0500  CBC with Differential/Platelet  Tomorrow morning,   R     07/16/19 1347   07/17/19 [id number]  Basic metabolic panel  Tomorrow morning,   R     07/16/19 1541   07/17/19 0500  CBC with Differential/Platelet  Tomorrow morning,   R     07/16/19 1541   07/16/19 [id number]  Basic metabolic panel  Once,   STAT     07/16/19 1347   07/16/19 1541  HIV antibody (Routine Screening)  Once,   STAT     07/16/19 1540   07/16/19 1541  Strep pneumoniae urinary antigen  Once,   STAT     07/16/19 1540   07/16/19 1540  CBC  (enoxaparin (LOVENOX)    CrCl < 30 ml/min)  Once,   STAT    Comments: Baseline for enoxaparin therapy IF NOT ALREADY DRAWN.  Notify MD if PLT < 100 K.    07/16/19 1539   07/16/19 0910  Urine culture  ONCE - STAT,   STAT     07/16/19 0909   07/16/19 0910  Blood Cultures (routine x 2)  BLOOD CULTURE X 2,   STAT,   Status:  Canceled     07/16/19 0909   Signed and Held  MRSA PCR Screening  Once,   R     Signed and Held          Vitals/Pain Today's Vitals   07/16/19 1500 07/16/19 1530 07/16/19 1537 07/16/19 1544  BP: (!) 130/58 (!) 98/52    Pulse: 96 93    Resp: 20 (!) 22    Temp:      TempSrc:      SpO2: 100% 98%  98%  Weight:      Height:      PainSc:   0-No pain     Isolation Precautions No active isolations  Medications Medications  vancomycin variable dose per unstable renal function (pharmacist dosing) (has no administration in time range)  ceFEPIme (MAXIPIME) 2 g in sodium chloride 0.9 % 100 mL IVPB (has no administration in time range)  metroNIDAZOLE (FLAGYL) IVPB 500 mg (has no administration in time range)  cefTRIAXone (ROCEPHIN) 2 g in sodium chloride 0.9 % 100 mL IVPB (has no administration in time range)  aspirin EC tablet 81 mg (has no administration in time range)   atorvastatin (LIPITOR) tablet 10 mg (has no administration in time range)  oxyCODONE (OXYCONTIN) 12 hr tablet 10 mg (has no administration in time range)  loratadine (CLARITIN) tablet 10 mg (has no administration in time range)  gabapentin (NEURONTIN) capsule 100 mg (has no administration in time range)  azithromycin (ZITHROMAX) 500 mg in sodium chloride 0.9 % 250 mL IVPB (has no administration in time range)  0.9 %  sodium chloride infusion ( Intravenous New Bag/Given 07/16/19 1352)  enoxaparin (LOVENOX) injection 30 mg (has no administration in time range)  acetaminophen (TYLENOL) tablet 650 mg (has no administration in time  range)    Or  acetaminophen (TYLENOL) suppository 650 mg (has no administration in time range)  polyethylene glycol (MIRALAX / GLYCOLAX) packet 17 g (has no administration in time range)  ceFEPIme (MAXIPIME) 2 g in sodium chloride 0.9 % 100 mL IVPB (0 g Intravenous Stopped 07/16/19 1038)  metroNIDAZOLE (FLAGYL) IVPB 500 mg (0 mg Intravenous Stopped 07/16/19 1347)  sodium chloride 0.9 % bolus 2,313 mL (0 mL/kg  77.1 kg Intravenous Stopped 07/16/19 1314)  vancomycin (VANCOCIN) 1,500 mg in sodium chloride 0.9 % 500 mL IVPB (0 mg Intravenous Stopped 07/16/19 1246)  acetaminophen (TYLENOL) tablet 650 mg (650 mg Oral Given 07/16/19 1114)  sodium chloride 0.9 % bolus 1,000 mL (0 mLs Intravenous Stopped 07/16/19 1538)    Mobility walks Moderate fall risk   Focused Assessments Pulmonary Assessment Handoff:  Lung sounds:   O2 Device: Room Air        R Recommendations: See Admitting Provider Note  Report given to:   Additional Notes:

## 2019-07-17 ENCOUNTER — Inpatient Hospital Stay (HOSPITAL_COMMUNITY): Payer: Medicare Other

## 2019-07-17 DIAGNOSIS — N179 Acute kidney failure, unspecified: Secondary | ICD-10-CM

## 2019-07-17 DIAGNOSIS — R7989 Other specified abnormal findings of blood chemistry: Secondary | ICD-10-CM

## 2019-07-17 DIAGNOSIS — I361 Nonrheumatic tricuspid (valve) insufficiency: Secondary | ICD-10-CM

## 2019-07-17 DIAGNOSIS — I25118 Atherosclerotic heart disease of native coronary artery with other forms of angina pectoris: Secondary | ICD-10-CM

## 2019-07-17 DIAGNOSIS — I248 Other forms of acute ischemic heart disease: Secondary | ICD-10-CM

## 2019-07-17 DIAGNOSIS — J189 Pneumonia, unspecified organism: Secondary | ICD-10-CM

## 2019-07-17 LAB — URINE CULTURE: Culture: <10000 — AB

## 2019-07-17 LAB — CBC WITH DIFFERENTIAL/PLATELET
Abs Immature Granulocytes: 0.08 10*3/uL — ABNORMAL HIGH (ref 0.00–0.07)
Basophils Absolute: 0 10*3/uL (ref 0.0–0.1)
Basophils Relative: 0 %
Eosinophils Absolute: 0.1 10*3/uL (ref 0.0–0.5)
Eosinophils Relative: 1 %
HCT: 27.1 % — ABNORMAL LOW (ref 39.0–52.0)
Hemoglobin: 9.1 g/dL — ABNORMAL LOW (ref 13.0–17.0)
Immature Granulocytes: 1 %
Lymphocytes Relative: 9 %
Lymphs Abs: 1 10*3/uL (ref 0.7–4.0)
MCH: 31.5 pg (ref 26.0–34.0)
MCHC: 33.6 g/dL (ref 30.0–36.0)
MCV: 93.8 fL (ref 80.0–100.0)
Monocytes Absolute: 0.5 10*3/uL (ref 0.1–1.0)
Monocytes Relative: 4 %
Neutro Abs: 10.2 10*3/uL — ABNORMAL HIGH (ref 1.7–7.7)
Neutrophils Relative %: 85 %
Platelets: 133 10*3/uL — ABNORMAL LOW (ref 150–400)
RBC: 2.89 MIL/uL — ABNORMAL LOW (ref 4.22–5.81)
RDW: 14.2 % (ref 11.5–15.5)
WBC: 11.9 10*3/uL — ABNORMAL HIGH (ref 4.0–10.5)
nRBC: 0 % (ref 0.0–0.2)

## 2019-07-17 LAB — ECHOCARDIOGRAM COMPLETE
Height: 63 in
Weight: 2772.5 oz

## 2019-07-17 LAB — BASIC METABOLIC PANEL
Anion gap: 8 (ref 5–15)
BUN: 45 mg/dL — ABNORMAL HIGH (ref 8–23)
CO2: 16 mmol/L — ABNORMAL LOW (ref 22–32)
Calcium: 7.9 mg/dL — ABNORMAL LOW (ref 8.9–10.3)
Chloride: 119 mmol/L — ABNORMAL HIGH (ref 98–111)
Creatinine, Ser: 1.98 mg/dL — ABNORMAL HIGH (ref 0.61–1.24)
GFR calc Af Amer: 36 mL/min — ABNORMAL LOW (ref >60)
GFR calc non Af Amer: 31 mL/min — ABNORMAL LOW (ref >60)
Glucose, Bld: 110 mg/dL — ABNORMAL HIGH (ref 70–99)
Potassium: 3.8 mmol/L (ref 3.5–5.1)
Sodium: 143 mmol/L (ref 135–145)

## 2019-07-17 LAB — TROPONIN I (HIGH SENSITIVITY)
Troponin I (High Sensitivity): 997 ng/L (ref <18)
Troponin I (High Sensitivity): 1179 ng/L (ref <18)
Troponin I (High Sensitivity): 1370 ng/L (ref <18)

## 2019-07-17 LAB — STREP PNEUMONIAE URINARY ANTIGEN: Strep Pneumo Urinary Antigen: NEGATIVE

## 2019-07-17 LAB — RENAL FUNCTION PANEL
Albumin: 2.3 g/dL — ABNORMAL LOW (ref 3.5–5.0)
Anion gap: 11 (ref 5–15)
BUN: 59 mg/dL — ABNORMAL HIGH (ref 8–23)
CO2: 14 mmol/L — ABNORMAL LOW (ref 22–32)
Calcium: 7.7 mg/dL — ABNORMAL LOW (ref 8.9–10.3)
Chloride: 117 mmol/L — ABNORMAL HIGH (ref 98–111)
Creatinine, Ser: 2.61 mg/dL — ABNORMAL HIGH (ref 0.61–1.24)
GFR calc Af Amer: 26 mL/min — ABNORMAL LOW (ref >60)
GFR calc non Af Amer: 23 mL/min — ABNORMAL LOW (ref >60)
Glucose, Bld: 86 mg/dL (ref 70–99)
Phosphorus: 3.8 mg/dL (ref 2.5–4.6)
Potassium: 3.9 mmol/L (ref 3.5–5.1)
Sodium: 142 mmol/L (ref 135–145)

## 2019-07-17 LAB — HIV ANTIBODY (ROUTINE TESTING W REFLEX): HIV Screen 4th Generation wRfx: NONREACTIVE

## 2019-07-17 LAB — HEPARIN LEVEL (UNFRACTIONATED)
Heparin Unfractionated: 0.43 IU/mL (ref 0.30–0.70)
Heparin Unfractionated: 0.35 IU/mL (ref 0.30–0.70)

## 2019-07-17 MED ORDER — ONDANSETRON HCL 4 MG/2ML IJ SOLN
4.0000 mg | Freq: Once | INTRAMUSCULAR | Status: AC | PRN
  Administered 2019-07-17: 4 mg via INTRAVENOUS
  Filled 2019-07-17: qty 2

## 2019-07-17 MED ORDER — HEPARIN (PORCINE) 25000 UT/250ML-% IV SOLN
1350.0000 [IU]/h | INTRAVENOUS | Status: DC
  Administered 2019-07-17 (×2): 1200 [IU]/h via INTRAVENOUS
  Filled 2019-07-17 (×2): qty 250

## 2019-07-17 MED ORDER — ATORVASTATIN CALCIUM 40 MG PO TABS
40.0000 mg | ORAL_TABLET | Freq: Every day | ORAL | Status: DC
  Administered 2019-07-18 – 2019-07-19 (×2): 40 mg via ORAL
  Filled 2019-07-17 (×2): qty 1

## 2019-07-17 MED ORDER — HEPARIN BOLUS VIA INFUSION
4000.0000 [IU] | Freq: Once | INTRAVENOUS | Status: AC
  Administered 2019-07-17: 4000 [IU] via INTRAVENOUS
  Filled 2019-07-17: qty 4000

## 2019-07-17 MED ORDER — METOPROLOL TARTRATE 12.5 MG HALF TABLET
12.5000 mg | ORAL_TABLET | Freq: Two times a day (BID) | ORAL | Status: DC
  Administered 2019-07-17 – 2019-07-20 (×6): 12.5 mg via ORAL
  Filled 2019-07-17 (×6): qty 1

## 2019-07-17 NOTE — Progress Notes (Signed)
  Echocardiogram 2D Echocardiogram has been performed.  Darlina Sicilian M 07/17/2019, 3:54 PM

## 2019-07-17 NOTE — Plan of Care (Signed)

## 2019-07-17 NOTE — Progress Notes (Signed)
[facility] for heparin Indication: pulmonary embolus vs ACS  Allergies  Allergen Reactions  . Plavix [Clopidogrel Bisulfate] Itching and Rash    Patient Measurements: Height: 5\' 3"  (160 cm) Weight: 173 lb 4.5 oz (78.6 kg) IBW/kg (Calculated) : 56.9 Heparin Dosing Weight: 73kg  Vital Signs: Temp: 98.4 F (36.9 C) (09/25 1200) Temp Source: Oral (09/25 1200) BP: 155/63 (09/25 1200) Pulse Rate: 79 (09/25 1200)  Labs: Recent Labs    07/16/19 0909  07/16/19 1731  07/17/19 0206 07/17/19 0448 07/17/19 0631 07/17/19 1552  HGB 11.7*  --  9.6*  --  9.1*  --   --   --   HCT 35.5*  --  28.9*  --  27.1*  --   --   --   PLT 163  --  145*  --  133*  --   --   --   HEPARINUNFRC  --   --   --   --   --   --   --  0.43  CREATININE 4.89*  --  3.49*  --  2.61*  --   --  1.98*  TROPONINIHS  --    < > 129*   < > 997* 1,179* 1,370*  --    < > = values in this interval not displayed.    Estimated Creatinine Clearance: 28.5 mL/min (A) (by C-G formula based on SCr of 1.98 mg/dL (H)).   Assessment: 68 YOM presenting with sepsis, now with concern for PE vs ACS. Pharmacy consulted to manage IV heparin.  Heparin level is therapeutic at 0.43 on 1200 units/hr. No bleeding noted. Troponin trending up, echo pending.  Goal of Therapy:  Heparin level 0.3-0.7 units/ml Monitor platelets by anticoagulation protocol: Yes   Plan:  Continue heparin drip at 1200 units/hr Confirmatory heparin level 21:30 Daily heparin level, CBC Monitor for s/sx of bleeding   Thank you for involving pharmacy in this patient's care.  Renold Genta, PharmD, BCPS Clinical Pharmacist Clinical phone for 07/17/2019 until 10p is [phone] 07/17/2019 4:34 PM  **Pharmacist phone directory can be found on [email or web address] listed under [place]**

## 2019-07-17 NOTE — Progress Notes (Addendum)
Family Medicine Teaching Service Daily Progress Note Intern Pager: [phone]  Patient name: Philip Hurst Medical record number: [id number] Date of birth: 1941/02/19 Age: 78 y.o. Gender: male  Primary Care Provider: Leonard Downing, MD Consultants: Neurology Code Status:   Code Status: Full Code   Pt Overview and Major Events to Date:  07/16/19: Patient admitted to progressive unit 07/17/19:  Trop 1370, Heparin, Cardiology consulted   Assessment and Plan: [name] is a 78 y.o. male presenting with episode of confusion earlier this morning and few month h/o "not feeling right." PMH is significant for HTN, CAD, HLD, HTN, PAD, former smoker (currently dips)   Sepsis 2/2 Pneumonia, possible new lung cancer Febrile 100.4 F this morning. WBC 11.9 from 19.8 on admission.  Chest x-ray yesterday was concerning for right lower lobe opacity in the setting of patient's cough there is more likely a pneumonia. MRSA PCR negative.  qSOFA score 0. Patient denies chest pain, shortness of breath, nausea, lightheadedness, jaw pain and UE numbness. hsTrops are up-trending 1370. Will add heparin. Concern for possible NSTEMI vs PE. Will consult cardiology.  Considering CTA Chest to rule out PE and look for potential underlying malignancy. ECHO ordered to assess for RV strain and assess LV function.   - Obtain CT Chest to evaluate for PE or malignancy, non contrast vs wait for resolution of AKI  - Heparin  - F/U ECHO -F/U on AM EKG - d/c vanc, flagyl, cefepime  - Continue CTX, azithro (9/24- )  - f/u strep pneumo urinary antigen - continuous pulse ox - PM BMP /  AM CBC/BMP - f/u blood cx - f/u urine cx   AMS, self resolved Denies diarrhea. Patient is alert and oriented person, place and year this morning. Wife reported a similar episode of altered mental status when patient was going through opiate withdrawal about a month ago as he was having diarrhea.   -Resume opioids for chronic pain  Acute on  Chronic Kidney Disease Stage III Cr 2.61 from 4.89 yesterday, baseline (~1.3, last record in 2016).  Likely secondary to dehydration in the setting of diarrhea due to opiate withdrawal, chronic NSAID use for chronic pain and history of poor oral intake.  - continue mIVF - PM BMP  - Hold antihypertensives, NSAIDs - Avoid nephrotoxic agents  HTN Initially hypotensive in the ED however hypertensive 160/61 this morning.  - On olmesartan 40mg  0.5-1 tablet daily at home. 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  Diarrhea resolved. Home regimen includes oxycodone 10mg  q6h prn, Xtampza ER 13.5mg  q12h, gabapentin, ibuprofen 400mg  daily at home. - Disontinue OxyContin 10 mg q12h based on formulary - Plan to restart home oxycodone 10 mg every 6 as needed tomorrow   - continue gabapentin - hold ibuprofen in setting of AKI - miralax prn   CAD Remote cath by Dr. Karlene Lineman years ago revealed 1 occluded coronary artery with collaterals, medical therapy was recommended.  Normal stress echo 11/2012. On ASA 81mg , statin, ARB at home.  Last saw cardiology in 2016. - Continue home statin and aspirin and hold arm due to AKI  PAD Occluded popliteal artery in LLE requiring angioplasty and stenting on 3 occasions.  On ASA 81mg , atorvastatin at home. Endorsed continued claudication on admission.  - continue home  ASA and statin  -Outpatient follow-up for ongoing claudication  HLD On atorvastatin 10mg  at home. - continue home medication  Allergies - continue home loratadine  FEN/GI: Heart healthy, mIVF Prophylaxis: lovenox  Disposition: Likely home pending medical clearance  Subjective:  [place] with no concerns this morning.  Denies CP, SOB, dizziness, diarrhea, numbness and jaw pain.   Objective: Temp:  [98.3  F (36.8 C)-100.4 F (38 C)] 100.4 F (38 C) (09/25 0456) Pulse Rate:  [90-132] 92 (09/25 0456) Resp:  [11-24] 16 (09/25 0456) BP: (75-160)/(46-93) 160/61 (09/25 0456) SpO2:  [91 %-100 %] 96 % (09/25 0456) Weight:  [77.1 kg-80.3 kg] 80.3 kg (09/24 1708)  Physical Exam: GEN: elderly male in no acute distress, non-ill appearing CV: regular rate and rhythm, no murmurs appreciated  RESP: no increased work of breathing, right basilar rales ABD: Bowel sounds present. Soft, Nontender, Nondistended MSK: no lower extremity edema NEURO: grossly normal, nlm speech, moves all extremities appropriately PSYCH: Normal affect and thought content   Laboratory: Recent Labs  Lab 07/16/19 0909 07/16/19 1731 07/17/19 0206  WBC 19.8* 15.6* 11.9*  HGB 11.7* 9.6* 9.1*  HCT 35.5* 28.9* 27.1*  PLT 163 145* 133*   Recent Labs  Lab 07/16/19 0909 07/16/19 1731 07/17/19 0206  NA 135 139 142  K 4.0 3.9 3.9  CL 104 115* 117*  CO2 14* 14* 14*  BUN 93* 72* 59*  CREATININE 4.89* 3.49* 2.61*  CALCIUM 7.5* 7.4* 7.7*  PROT 5.7*  --   --   BILITOT 0.1*  --   --   ALKPHOS 92  --   --   ALT 22  --   --   AST 25  --   --   GLUCOSE 103* 120* 86     Imaging/Diagnostic Tests: Ct Head Wo Contrast  Result Date: 07/16/2019 CLINICAL DATA:  Seizure-like activity and confusion EXAM: CT HEAD WITHOUT CONTRAST TECHNIQUE: Contiguous axial images were obtained from the base of the skull through the vertex without intravenous contrast. COMPARISON:  None. FINDINGS: Brain: There is moderate diffuse atrophy with atrophy most notable in the left parietal region. There is no intracranial mass, hemorrhage, extra-axial fluid collection, or midline shift. There is small vessel disease scattered throughout the centra semiovale bilaterally. No acute appearing infarct is demonstrable on this study. Vascular: There is no hyperdense vessel. There is calcification in each carotid siphon region. Skull: Bony calvarium appears intact.  Sinuses/Orbits: There is mucosal thickening in several ethmoid air cells. Other visualized paranasal sinuses are clear. Orbits appear symmetric bilaterally. Other: There is opacification in several inferior mastoid air cells on the left. Mastoids elsewhere bilaterally are clear. IMPRESSION: Atrophy with supratentorial small vessel disease throughout portions of the periventricular white matter. No acute infarct is demonstrable. No evident mass or hemorrhage. Foci of arterial vascular calcification noted. Foci of paranasal sinus disease present. Inferior mastoid air cell disease on the left. Electronically Signed   By: Lowella Grip III M.D.   On: 07/16/2019 10:49   Dg Chest Portable 1 View  Result Date: 07/16/2019 CLINICAL DATA:  Cough, febrile EXAM: PORTABLE CHEST 1 VIEW COMPARISON:  August 31, 2015 FINDINGS: There is mild cardiomegaly. Patchy airspace opacity seen in the right lower lung. The left lung is clear. IMPRESSION: Patchy airspace opacity in the right lower lung which could be due to early infectious etiology or atelectasis. Electronically Signed   By: Prudencio Pair M.D.   On: 07/16/2019 09:55     Lyndee Hensen, MD 07/17/2019, 5:32  AM PGY-1, [place] Intern pager: [phone], text pages welcome

## 2019-07-17 NOTE — Progress Notes (Signed)
Pt started on heparin drip at 12 ml/hr, as order by MD. No complaints of sob, chest pain or discomfort.will continue to monitor.

## 2019-07-17 NOTE — Progress Notes (Signed)
ANTICOAGULATION CONSULT NOTE - Initial Consult  Pharmacy Consult for Heparin Indication: pulmonary embolus  Allergies  Allergen Reactions  . Plavix [Clopidogrel Bisulfate] Itching and Rash    Patient Measurements: Height: 5\' 3"  (160 cm) Weight: 173 lb 4.5 oz (78.6 kg) IBW/kg (Calculated) : 56.9 Heparin Dosing Weight: 73kg  Vital Signs: Temp: 100.4 F (38 C) (09/25 0456) Temp Source: Oral (09/25 0456) BP: 160/61 (09/25 0456) Pulse Rate: 92 (09/25 0456)  Labs: Recent Labs    07/16/19 0909  07/16/19 1731  07/17/19 0206 07/17/19 0448 07/17/19 0631  HGB 11.7*  --  9.6*  --  9.1*  --   --   HCT 35.5*  --  28.9*  --  27.1*  --   --   PLT 163  --  145*  --  133*  --   --   CREATININE 4.89*  --  3.49*  --  2.61*  --   --   TROPONINIHS  --    < > 129*   < > 997* 1,179* 1,370*   < > = values in this interval not displayed.    Estimated Creatinine Clearance: 21.6 mL/min (A) (by C-G formula based on SCr of 2.61 mg/dL (H)).   Medical History: Past Medical History:  Diagnosis Date  . Arthritis    "back, joints in right hand" (08/18/2013)  . CAD (coronary artery disease), single vessel disease with collaterals, medical therapy 09/12/2011  . Chronic lower back pain   . Claudication of left lower extremity, ABI Lt 0.52 -lifestyle limiting  ABI rt 0.90 01/30/2013  . Coronary artery disease    OCCLUDED RCA  . Hyperlipidemia   . Hypertension   . Normal cardiac stress test, No ischemia 12/15/12 01/30/2013  . PAD (peripheral artery disease) (HCC)    Assessment: 44 YOM presenting with sepsis, now with concern for PE and to start heparin.  Has been on lovenox for DVT ppx 30mg  q24h, last dose 9/24 @ 1800  Goal of Therapy:  Heparin level 0.3-0.7 units/ml Monitor platelets by anticoagulation protocol: Yes   Plan:  Heparin 4000 units IV x1, and gtt at 1200 units/hr F/u heparin level in 8 hours  Bertis Ruddy, PharmD Clinical Pharmacist Please check AMION for all [place]  numbers 07/17/2019 7:58 AM

## 2019-07-17 NOTE — Progress Notes (Signed)
[place] for heparin Indication: pulmonary embolus vs ACS  Allergies  Allergen Reactions  . Plavix [Clopidogrel Bisulfate] Itching and Rash    Patient Measurements: Height: 5\' 3"  (160 cm) Weight: 173 lb 4.5 oz (78.6 kg) IBW/kg (Calculated) : 56.9 Heparin Dosing Weight: 73kg  Vital Signs: Temp: 99.1 F (37.3 C) (09/25 2050) Temp Source: Oral (09/25 2050) BP: 114/77 (09/25 2050) Pulse Rate: 81 (09/25 2050)  Labs: Recent Labs    07/16/19 0909  07/16/19 1731  07/17/19 0206 07/17/19 0448 07/17/19 0631 07/17/19 1552 07/17/19 2125  HGB 11.7*  --  9.6*  --  9.1*  --   --   --   --   HCT 35.5*  --  28.9*  --  27.1*  --   --   --   --   PLT 163  --  145*  --  133*  --   --   --   --   HEPARINUNFRC  --   --   --   --   --   --   --  0.43 0.35  CREATININE 4.89*  --  3.49*  --  2.61*  --   --  1.98*  --   TROPONINIHS  --    < > 129*   < > 997* 1,179* 1,370*  --   --    < > = values in this interval not displayed.    Estimated Creatinine Clearance: 28.5 mL/min (A) (by C-G formula based on SCr of 1.98 mg/dL (H)).   Assessment: 2 YOM presenting with sepsis, now with concern for PE vs ACS. Pharmacy consulted to manage IV heparin.  Heparin level is therapeutic at 0.35 on 1200 units/hr. No bleeding noted. Troponin trending up, echo pending.  Goal of Therapy:  Heparin level 0.3-0.7 units/ml Monitor platelets by anticoagulation protocol: Yes   Plan:  Continue heparin drip at 1200 units/hr Daily heparin level, CBC Monitor for s/sx of bleeding   Thank you for involving pharmacy in this patient's care.  Erin Hearing PharmD., BCPS Clinical Pharmacist 07/17/2019 10:00 PM

## 2019-07-17 NOTE — Consult Note (Addendum)
Cardiology Consultation:   Patient ID: Philip Hurst MRN: [id number]; DOB: 01/11/1941  Admit date: 07/16/2019 Date of Consult: 07/17/2019  Primary Care Provider: Leonard Downing, MD Primary Cardiologist: Dr. Gwenlyn Found (Last seen 2016)   Patient Profile:   Philip Hurst is a 78 y.o. male with a hx of medically managed CAD, hypertension, hyperlipidemia, peripheral arterial disease and remote tobacco smoking who is being seen today for the evaluation of elevated troponin at the request of Dr. Ardelia Mems.  Hx of remote cardiac catheterization revealing occluded right coronary artery with collaterals.  Medical therapy recommended.  Last stress test in 2014 was low risk.  History of PAD status post initial popliteal artery intervention using an IDEV self-expanding stent back in 2014. Because of "in-stent restenosis" he underwent restenting using a viable on coverage stent 08/18/13. Dopplers suggested once again that his popliteal artery had occluded on that side (08/29/15) and angiography performed 09/05/15 confirm this>> He was referred to Dr. Glynda Jaeger for fem-tib bypass>> recommended medical therapy.   No follow up since 2016.  History of Present Illness:   Mr. Faupel presented with altered mental status yesterday.  CT of head without acute findings.  He was admitted for sepsis secondary to pneumonia.  There was a concern regarding lung cancer however unable to do CT scan of lung secondary to elevated renal function.  Cardiology is asked for evaluation of uptrending high-sensitivity troponin (initial 75>> last reading 1370).  Patient is unsure why he is here.  He states " my wife brought me here".  Patient reports shortness of breath recently.  He does yard work without chest pain.  No long distance walking due to leg pain.  Serum creatinine 3.49>> 2.61.  Hemoglobin 9.1.  WBC 11.9.  Echocardiogram being done. D-dimer 1.38.  Heart Pathway Score:     Past Medical History:  Diagnosis Date  . Arthritis     "back, joints in right hand" (08/18/2013)  . CAD (coronary artery disease), single vessel disease with collaterals, medical therapy 09/12/2011  . Chronic lower back pain   . Claudication of left lower extremity, ABI Lt 0.52 -lifestyle limiting  ABI rt 0.90 01/30/2013  . Coronary artery disease    OCCLUDED RCA  . Hyperlipidemia   . Hypertension   . Normal cardiac stress test, No ischemia 12/15/12 01/30/2013  . PAD (peripheral artery disease) ([place])     Past Surgical History:  Procedure Laterality Date  . BACK SURGERY    . CARDIAC CATHETERIZATION  @2002    1 vessel disease w/collaterals,medical therapy  . CARDIOVASCULAR STRESS TEST  12/07/2008   EF 58%, NO ISCHEMIA  . CATARACT EXTRACTION W/ INTRAOCULAR LENS  IMPLANT, BILATERAL Bilateral   . 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 lower extremities arterial doppler  02/16/2013   bilateral ABIs normal values w/o evidence arterial insuff. at rest; left  lower extrem. norm. left popliteal artery stent norm. patency  . LOWER EXTREMITY ANGIOGRAM Left 08/18/2013   Procedure: LOWER EXTREMITY ANGIOGRAM;  Surgeon: Lorretta Harp, MD;  Location: [facility] CATH LAB;  Service: Cardiovascular;  Laterality: Left;  . LUMBAR DISC SURGERY  1990's   "herniated disc" (08/18/2013)  . PERCUTANEOUS STENT INTERVENTION  01/30/2013   Procedure: PERCUTANEOUS STENT INTERVENTION;  Surgeon: Lorretta Harp, MD;  Location: [facility] CATH LAB;  Service: Cardiovascular;;  . PERIPHERAL VASCULAR CATHETERIZATION N/A 09/05/2015   Procedure: Lower Extremity Angiography;  Surgeon: Lorretta Harp, MD;  Location: [facility] CV LAB;  Service: Cardiovascular;  Laterality: N/A;  . POPLITEAL ARTERY STENT Left 01/30/13  . POPLITEAL ARTERY STENT Left 10/'28/2014   successful Viabahn covered stent placement within the previously placed IDEV  stents/notes 08/18/2013  . POSTERIOR LUMBAR FUSION  1990's  . TONSILLECTOMY    . WRIST FUSION Right 1990's?   "first OR didn't  correct problem" (08/18/2013)     Inpatient Medications: Scheduled Meds: . aspirin EC  81 mg Oral Daily  . [START ON 07/18/2019] atorvastatin  40 mg Oral Daily  . gabapentin  100 mg Oral QHS  . loratadine  10 mg Oral Daily  . oxyCODONE  10 mg Oral Q12H   Continuous Infusions: . sodium chloride 125 mL/hr at 07/17/19 0950  . azithromycin 500 mg (07/17/19 1452)  . cefTRIAXone (ROCEPHIN)  IV 2 g (07/17/19 0953)  . heparin 1,200 Units/hr (07/17/19 0829)   PRN Meds: acetaminophen **OR** acetaminophen, polyethylene glycol  Allergies:    Allergies  Allergen Reactions  . Plavix [Clopidogrel Bisulfate] Itching and Rash    Social History:   Social History   Socioeconomic History  . Marital status: Married    Spouse name: [name]  . Number of children: 4  . Years of education: Not on file  . Highest education level: Not on file  Occupational History  . Not on file  Social Needs  . Financial resource strain: Not on file  . Food insecurity    Worry: Not on file    Inability: Not on file  . Transportation needs    Medical: Not on file    Non-medical: Not on file  Tobacco Use  . Smoking status: Former Smoker    Packs/day: 1.00    Years: 44.00    Pack years: 44.00    Types: Cigarettes    Quit date: 08/26/1997    Years since quitting: 21.9  . Smokeless tobacco: Current User    Types: Chew  Substance and Sexual Activity  . Alcohol use: Yes    Comment: 08/18/2013 "glass of wine 5-6 months ago; very rare"  . Drug use: No  . Sexual activity: Never  Lifestyle  . Physical activity    Days per week: Not on file    Minutes per session: Not on file  . Stress: Not on file  Relationships  . Social Herbalist on phone: Not on file    Gets together: Not on file    Attends religious service: Not on file    Active member of club or organization: Not on file    Attends meetings of clubs or organizations: Not on file    Relationship status: Not on file  . Intimate  partner violence    Fear of current or ex partner: Not on file    Emotionally abused: Not on file    Physically abused: Not on file    Forced sexual activity: Not on file  Other Topics Concern  . Not on file  Social History Narrative  . Not on file    Family History:   Family History  Problem Relation Age of Onset  . Seizures Mother   . Cancer Father   . Coronary artery disease Brother   . Diabetes Brother      ROS:  Please see the history of present illness.  All other ROS reviewed and negative.     Physical Exam/Data:   Vitals:   07/17/19 0456 07/17/19 0716 07/17/19 0855 07/17/19 1200  BP: (!) 160/61  (!) 151/65 (!) 155/63  Pulse: 92  78 79  Resp: 16  17 17   Temp: (!) 100.4 F (38 C)  98.3 F (36.8 C) 98.4 F (36.9 C)  TempSrc: Oral  Oral Oral  SpO2: 96%  98% 98%  Weight:  78.6 kg    Height:        Intake/Output Summary (Last 24 hours) at 07/17/2019 1526 Last data filed at 07/17/2019 1412 Gross per 24 hour  Intake 2977.46 ml  Output 1715 ml  Net 1262.46 ml   Last 3 Weights 07/17/2019 07/16/2019 07/16/2019  Weight (lbs) 173 lb 4.5 oz 177 lb 0.5 oz 170 lb  Weight (kg) 78.6 kg 80.3 kg 77.111 kg     Body mass index is 30.7 kg/m.  General:  Well nourished, well developed, in no acute distress HEENT: normal Lymph: no adenopathy Neck: no JVD Endocrine:  No thryomegaly Vascular: No carotid bruits; FA pulses 2+ bilaterally without bruits  Cardiac:  normal S1, S2; RRR; no murmur  Lungs: Diminished breath sounds Abd: soft, nontender, no hepatomegaly  Ext: no edema Musculoskeletal:  No deformities, BUE and BLE strength normal and equal Skin: warm and dry  Neuro:   no focal abnormalities noted Psych: Answers questions appropriately but unable to reconcile prior event  EKG:  The EKG was personally reviewed and demonstrates: Sinus rhythm at rate of 69 bpm, PVC Telemetry:  Telemetry was personally reviewed and demonstrates: Sinus rhythm at controlled ventricular rate  of 60 to 80s, intermittent ventricular bigeminy  Relevant CV Studies:  Nuclear stress test 11/2012 Rest Procedure:  Myocardial perfusion imaging was performed at rest 45 minutes following the intravenous administration of Technetium 28m Sestamibi. Stress Procedure:  The patient received IV Lexiscan 0.4 mg over 15-seconds.  Technetium 70m Sestamibi injected at 30-seconds.  There were no significant changes with Lexiscan.  Quantitative spect images were obtained after a 45 minute delay.  Transient Ischemic Dilatation (Normal <1.22):  0.80   Lung/Heart Ratio (Normal <0.45):  0.38 QGS EDV:  65 ml QGS ESV:  22 ml LV Ejection Fraction: 66%  Signed by    Rest ECG: NSR with 1st degree AVB  Stress ECG: There are scattered PVCs.  QPS Raw Data Images:  Normal; no motion artifact; normal heart/lung ratio. Stress Images:  Small fixed basal inferior bowel artifact. Rest Images:  Small fixed basal inferior bowel artifact. Subtraction (SDS):  No evidence of ischemia.  Impression Exercise Capacity:  Lexiscan with no exercise. BP Response:  Normal blood pressure response. Clinical Symptoms:  No significant symptoms noted. ECG Impression:  There are scattered PVCs. Comparison with Prior Nuclear Study: No previous nuclear study performed  Overall Impression:  Low risk stress nuclear study. Small basal inferior bowel attenuation artifact. No ischemia.  LV Wall Motion:  NL LV Function; NL Wall Motion; EF 66%. TID 0.8 (normal)  Laboratory Data:  High Sensitivity Troponin:   Recent Labs  Lab 07/16/19 2002 07/16/19 2142 07/17/19 0206 07/17/19 0448 07/17/19 0631  TROPONINIHS 231* 391* 997* 1,179* 1,370*     Chemistry Recent Labs  Lab 07/16/19 0909 07/16/19 1731 07/17/19 0206  NA 135 139 142  K 4.0 3.9 3.9  CL 104 115* 117*  CO2 14* 14* 14*  GLUCOSE 103* 120* 86  BUN 93* 72* 59*  CREATININE 4.89* 3.49* 2.61*  CALCIUM 7.5* 7.4* 7.7*  GFRNONAA 11* 16* 23*  GFRAA 12* 18*  26*  ANIONGAP 17* 10 11    Recent Labs  Lab 07/16/19 0909 07/17/19 0206  PROT 5.7*  --  ALBUMIN 3.1* 2.3*  AST 25  --   ALT 22  --   ALKPHOS 92  --   BILITOT 0.1*  --    Hematology Recent Labs  Lab 07/16/19 0909 07/16/19 1731 07/17/19 0206  WBC 19.8* 15.6* 11.9*  RBC 3.63* 3.06* 2.89*  HGB 11.7* 9.6* 9.1*  HCT 35.5* 28.9* 27.1*  MCV 97.8 94.4 93.8  MCH 32.2 31.4 31.5  MCHC 33.0 33.2 33.6  RDW 14.1 14.2 14.2  PLT 163 145* 133*   DDimer  Recent Labs  Lab 07/16/19 0928  DDIMER 1.38*   Radiology/Studies:  Ct Head Wo Contrast  Result Date: 07/16/2019 CLINICAL DATA:  Seizure-like activity and confusion EXAM: CT HEAD WITHOUT CONTRAST TECHNIQUE: Contiguous axial images were obtained from the base of the skull through the vertex without intravenous contrast. COMPARISON:  None. FINDINGS: Brain: There is moderate diffuse atrophy with atrophy most notable in the left parietal region. There is no intracranial mass, hemorrhage, extra-axial fluid collection, or midline shift. There is small vessel disease scattered throughout the centra semiovale bilaterally. No acute appearing infarct is demonstrable on this study. Vascular: There is no hyperdense vessel. There is calcification in each carotid siphon region. Skull: Bony calvarium appears intact. Sinuses/Orbits: There is mucosal thickening in several ethmoid air cells. Other visualized paranasal sinuses are clear. Orbits appear symmetric bilaterally. Other: There is opacification in several inferior mastoid air cells on the left. Mastoids elsewhere bilaterally are clear. IMPRESSION: Atrophy with supratentorial small vessel disease throughout portions of the periventricular white matter. No acute infarct is demonstrable. No evident mass or hemorrhage. Foci of arterial vascular calcification noted. Foci of paranasal sinus disease present. Inferior mastoid air cell disease on the left. Electronically Signed   By: Lowella Grip III M.D.    On: 07/16/2019 10:49   Dg Chest Portable 1 View  Result Date: 07/16/2019 CLINICAL DATA:  Cough, febrile EXAM: PORTABLE CHEST 1 VIEW COMPARISON:  August 31, 2015 FINDINGS: There is mild cardiomegaly. Patchy airspace opacity seen in the right lower lung. The left lung is clear. IMPRESSION: Patchy airspace opacity in the right lower lung which could be due to early infectious etiology or atelectasis. Electronically Signed   By: Prudencio Pair M.D.   On: 07/16/2019 09:55    Assessment and Plan:   1. Elevated troponin - Trending up, last reading 1370.  He denies any chest pain prior to presentation.  May be some shortness of breath.  History unreliable.  EKG without acute ischemic changes. Patient has prior history of occluded RCA with collateral per note (no cath report available for review).  His symptoms could be ACS versus demand ischemia. -Echocardiogram being done>> if normal LV function without wall motion abnormality.  Likely outpatient evaluation.  2.  CAD -Occluded RCA with collateral per note remotely -Continue aspirin and statin  3.  Sepsis secondary to pneumonia -On broad-spectrum antibiotic per primary team -Given history of smoking there was a concern for lung cancer +/-PE due to elevated d-dimer -Work-up per primary team  4.  AKI - Scr was 1.36 in 2016 -Renal function improving with hydration 4.89>>3.49>>2.61  5. HTN - Home ARB on hold 2nd to elevated renal function and hypotensive initially - BP in 155/63 - Will add metoprolol  6. PVCs/vent. bigamy - Intermittent. Keep K > 40 and Mg at 2 - Add BB as above  7. Elevated D-dimer - Per primary   8. PAD - follow up with Dr. Gwenlyn Found as outpatient with study   Dr.  Duran Ohern to see.    For questions or updates, please contact [place] Please consult [email or web address] for contact info under     Jarrett Soho, PA  07/17/2019 3:26 PM   I have examined the patient and reviewed assessment and plan and  discussed with patient.  Agree with above as stated.    Known CAD from prior cath.  Elevated troponin is likely from demand ischemia in the setting of medical illness.  I personally reviewed the echo and there are no clear focal RWMA.  He is not a candidate for cath and would not pursue any other ischemia eval given his lack of cardiac symptoms.  Continue medical therapy as noted above.  If his mental status recovers and he gets back to baseline, could consider noninvasive ischemia testing at that time depending on renal function.   IV heparin   Ordered.  Not needed for ACS.  Can continue for possible PE.    Larae Grooms

## 2019-07-18 ENCOUNTER — Inpatient Hospital Stay (HOSPITAL_COMMUNITY): Payer: Medicare Other

## 2019-07-18 DIAGNOSIS — E8770 Fluid overload, unspecified: Secondary | ICD-10-CM

## 2019-07-18 LAB — TROPONIN I (HIGH SENSITIVITY): Troponin I (High Sensitivity): 3396 ng/L (ref <18)

## 2019-07-18 LAB — RENAL FUNCTION PANEL
Albumin: 2.3 g/dL — ABNORMAL LOW (ref 3.5–5.0)
Anion gap: 9 (ref 5–15)
BUN: 37 mg/dL — ABNORMAL HIGH (ref 8–23)
CO2: 17 mmol/L — ABNORMAL LOW (ref 22–32)
Calcium: 8.1 mg/dL — ABNORMAL LOW (ref 8.9–10.3)
Chloride: 119 mmol/L — ABNORMAL HIGH (ref 98–111)
Creatinine, Ser: 1.65 mg/dL — ABNORMAL HIGH (ref 0.61–1.24)
GFR calc Af Amer: 45 mL/min — ABNORMAL LOW (ref >60)
GFR calc non Af Amer: 39 mL/min — ABNORMAL LOW (ref >60)
Glucose, Bld: 125 mg/dL — ABNORMAL HIGH (ref 70–99)
Phosphorus: 2.6 mg/dL (ref 2.5–4.6)
Potassium: 3.7 mmol/L (ref 3.5–5.1)
Sodium: 145 mmol/L (ref 135–145)

## 2019-07-18 LAB — CBC
HCT: 27.3 % — ABNORMAL LOW (ref 39.0–52.0)
Hemoglobin: 9.1 g/dL — ABNORMAL LOW (ref 13.0–17.0)
MCH: 31.1 pg (ref 26.0–34.0)
MCHC: 33.3 g/dL (ref 30.0–36.0)
MCV: 93.2 fL (ref 80.0–100.0)
Platelets: 150 10*3/uL (ref 150–400)
RBC: 2.93 MIL/uL — ABNORMAL LOW (ref 4.22–5.81)
RDW: 14.3 % (ref 11.5–15.5)
WBC: 12.4 10*3/uL — ABNORMAL HIGH (ref 4.0–10.5)
nRBC: 0 % (ref 0.0–0.2)

## 2019-07-18 LAB — HEPARIN LEVEL (UNFRACTIONATED): Heparin Unfractionated: 0.24 IU/mL — ABNORMAL LOW (ref 0.30–0.70)

## 2019-07-18 MED ORDER — IOHEXOL 350 MG/ML SOLN
55.0000 mL | Freq: Once | INTRAVENOUS | Status: AC | PRN
  Administered 2019-07-18: 55 mL via INTRAVENOUS

## 2019-07-18 MED ORDER — CEFDINIR 300 MG PO CAPS
300.0000 mg | ORAL_CAPSULE | Freq: Two times a day (BID) | ORAL | Status: AC
  Administered 2019-07-18 – 2019-07-23 (×9): 300 mg via ORAL
  Filled 2019-07-18 (×11): qty 1

## 2019-07-18 MED ORDER — MELATONIN 3 MG PO TABS
3.0000 mg | ORAL_TABLET | Freq: Once | ORAL | Status: AC
  Administered 2019-07-18: 3 mg via ORAL
  Filled 2019-07-18: qty 1

## 2019-07-18 MED ORDER — SODIUM CHLORIDE 0.9 % IV SOLN
INTRAVENOUS | Status: DC
  Administered 2019-07-18: 16:00:00 via INTRAVENOUS

## 2019-07-18 MED ORDER — FUROSEMIDE 10 MG/ML IJ SOLN
40.0000 mg | Freq: Once | INTRAMUSCULAR | Status: AC
  Administered 2019-07-18: 19:00:00 40 mg via INTRAVENOUS
  Filled 2019-07-18: qty 4

## 2019-07-18 MED ORDER — HYDROCODONE-ACETAMINOPHEN 10-325 MG PO TABS
1.0000 | ORAL_TABLET | ORAL | Status: DC | PRN
  Administered 2019-07-18 – 2019-07-21 (×15): 1 via ORAL
  Filled 2019-07-18 (×17): qty 1

## 2019-07-18 MED ORDER — HEPARIN (PORCINE) 25000 UT/250ML-% IV SOLN
1350.0000 [IU]/h | INTRAVENOUS | Status: DC
  Administered 2019-07-18: 1350 [IU]/h via INTRAVENOUS
  Filled 2019-07-18: qty 250

## 2019-07-18 MED ORDER — SENNA 8.6 MG PO TABS: 1.0000 | ORAL_TABLET | Freq: Every day | ORAL | Status: DC

## 2019-07-18 MED ORDER — LIDOCAINE 5 % EX PTCH
1.0000 | MEDICATED_PATCH | Freq: Once | CUTANEOUS | Status: AC
  Administered 2019-07-18: 1 via TRANSDERMAL
  Filled 2019-07-18: qty 1

## 2019-07-18 MED ORDER — AZITHROMYCIN 500 MG PO TABS
500.0000 mg | ORAL_TABLET | Freq: Once | ORAL | Status: AC
  Administered 2019-07-18: 500 mg via ORAL
  Filled 2019-07-18: qty 1

## 2019-07-18 MED ORDER — ENOXAPARIN SODIUM 40 MG/0.4ML ~~LOC~~ SOLN
40.0000 mg | SUBCUTANEOUS | Status: DC
  Administered 2019-07-18: 16:00:00 40 mg via SUBCUTANEOUS
  Filled 2019-07-18: qty 0.4

## 2019-07-18 MED ORDER — SENNA 8.6 MG PO TABS: 1.0000 | ORAL_TABLET | Freq: Every day | ORAL | Status: DC | PRN

## 2019-07-18 MED ORDER — NON FORMULARY: Freq: Once | Status: DC

## 2019-07-18 NOTE — Consult Note (Signed)
Cardiology Consultation:   Patient ID: Philip Hurst MRN: [id number]; DOB: 1941/09/07  Admit date: 07/16/2019 Date of Consult: 07/18/2019  Primary Care Provider: Leonard Downing, MD Primary Cardiologist: Dr. Gwenlyn Found, Dr. Irish Lack    Patient Profile:   Philip Hurst is a 78 y.o. male with a hx of medically managed CAD, hypertension, hyperlipidemia, peripheral arterial disease and remote tobacco smoking who is being seen today for follow up of elevated troponin.  Interval history:   Mr. Collman presented with altered mental status and is being treated for sepsis secondary to pneumonia. Hospital course is also notable for AKI with Cr up to 3.49, which is improving. He was evaluated by the cardiology consult service for elevated troponin up to 1370. Echocardiogram was performed and showed normal LV function without WMA. His elevated troponin was attributed to demand ischemia. Cardiology signed off today.  I was notified by the primary team this evening that the patient had new ST elevations on his ECG with troponin up to 3396 (from 1370 yesterday). He reportedly was endorsing worsening dyspnea without chest pain.  On my evaluation, the patient denies chest pain but states that he is more dyspneic this evening. He reports that he is unable to lay flat without worsening shortness of breath. He denies other complaints.  Review of ECG this evening shows sinus rhythm with probable limb lead reversal (negative P and QS in I, AVL) with 1-2 mm ST elevation in V3, ~2 mm ST elevation in V4 and V5. ECG was repeated on my arrival and shows sinus tachycardia (with appropriately upright P/R waves in lateral limb leads), with 1-2 mm ST elevation in V2 and V3 and subtle elevation in V4.    Heart Pathway Score:     Past Medical History:  Diagnosis Date  . Arthritis    "back, joints in right hand" (08/18/2013)  . CAD (coronary artery disease), single vessel disease with collaterals, medical therapy 09/12/2011   . Chronic lower back pain   . Claudication of left lower extremity, ABI Lt 0.52 -lifestyle limiting  ABI rt 0.90 01/30/2013  . Coronary artery disease    OCCLUDED RCA  . Hyperlipidemia   . Hypertension   . Normal cardiac stress test, No ischemia 12/15/12 01/30/2013  . PAD (peripheral artery disease) ([place])     Past Surgical History:  Procedure Laterality Date  . BACK SURGERY    . CARDIAC CATHETERIZATION  @2002    1 vessel disease w/collaterals,medical therapy  . CARDIOVASCULAR STRESS TEST  12/07/2008   EF 58%, NO ISCHEMIA  . CATARACT EXTRACTION W/ INTRAOCULAR LENS  IMPLANT, BILATERAL Bilateral   . 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 lower extremities arterial doppler  02/16/2013   bilateral ABIs normal values w/o evidence arterial insuff. at rest; left  lower extrem. norm. left popliteal artery stent norm. patency  . LOWER EXTREMITY ANGIOGRAM Left 08/18/2013   Procedure: LOWER EXTREMITY ANGIOGRAM;  Surgeon: Lorretta Harp, MD;  Location[facility] C[facility] LAB;  Service: Cardiovascular;  Laterality: Left;  . LUMBAR DISC SURGERY  1990's   "herniated disc" (08/18/2013)  . PERCUTANEOUS STENT INTERVENTION  01/30/2013   Procedure: PERCUTANEOUS STENT INTERVENTION;  Surgeon: Lorretta Harp, MD;  Location: [facility] CATH LAB;  Service: Cardiovascular;;  . PERIPHERAL VASCULAR CATHETERIZATION N/A 09/05/2015   Procedure: Lower Extremity Angiography;  Surgeon: Lorretta Harp, MD;  Location: [place] CV LAB;  Service: Cardiovascular;  Laterality: N/A;  . POPLITEAL ARTERY STENT Left 01/30/13  . POPLITEAL  ARTERY STENT Left 10/'28/2014   successful Viabahn covered stent placement within the previously placed IDEV  stents/notes 08/18/2013  . POSTERIOR LUMBAR FUSION  1990's  . TONSILLECTOMY    . WRIST FUSION Right 1990's?   "first OR didn't correct problem" (08/18/2013)     Inpatient Medications: Scheduled Meds: . aspirin EC  81 mg Oral Daily  . atorvastatin  40 mg Oral Daily  .  cefdinir  300 mg Oral Q12H  . gabapentin  100 mg Oral QHS  . loratadine  10 mg Oral Daily  . metoprolol tartrate  12.5 mg Oral BID   Continuous Infusions: . heparin     PRN Meds: acetaminophen **OR** acetaminophen, HYDROcodone-acetaminophen, polyethylene glycol, senna  Allergies:    Allergies  Allergen Reactions  . Plavix [Clopidogrel Bisulfate] Itching and Rash    Social History:   Social History   Socioeconomic History  . Marital status: Married    Spouse name: [name]  . Number of children: 4  . Years of education: Not on file  . Highest education level: Not on file  Occupational History  . Not on file  Social Needs  . Financial resource strain: Not on file  . Food insecurity    Worry: Not on file    Inability: Not on file  . Transportation needs    Medical: Not on file    Non-medical: Not on file  Tobacco Use  . Smoking status: Former Smoker    Packs/day: 1.00    Years: 44.00    Pack years: 44.00    Types: Cigarettes    Quit date: 08/26/1997    Years since quitting: 21.9  . Smokeless tobacco: Current User    Types: Chew  Substance and Sexual Activity  . Alcohol use: Yes    Comment: 08/18/2013 "glass of wine 5-6 months ago; very rare"  . Drug use: No  . Sexual activity: Never  Lifestyle  . Physical activity    Days per week: Not on file    Minutes per session: Not on file  . Stress: Not on file  Relationships  . Social Herbalist on phone: Not on file    Gets together: Not on file    Attends religious service: Not on file    Active member of club or organization: Not on file    Attends meetings of clubs or organizations: Not on file    Relationship status: Not on file  . Intimate partner violence    Fear of current or ex partner: Not on file    Emotionally abused: Not on file    Physically abused: Not on file    Forced sexual activity: Not on file  Other Topics Concern  . Not on file  Social History Narrative  . Not on file     Family History:   Family History  Problem Relation Age of Onset  . Seizures Mother   . Cancer Father   . Coronary artery disease Brother   . Diabetes Brother      ROS:  Please see the history of present illness.  All other ROS reviewed and negative.     Physical Exam/Data:   Vitals:   07/18/19 1728 07/18/19 1839 07/18/19 1916 07/18/19 2014  BP:  (!) 168/107 (!) 179/107 (!) 161/99  Pulse:  (!) 104 (!) 114 (!) 104  Resp:  (!) 22 (!) 26 (!) 24  Temp:    98.9 F (37.2 C)  TempSrc:    Axillary  SpO2: 92% 93% 93% 97%  Weight:      Height:        Intake/Output Summary (Last 24 hours) at 07/18/2019 2019 Last data filed at 07/18/2019 2002 Gross per 24 hour  Intake 3813.32 ml  Output 2975 ml  Net 838.32 ml   Last 3 Weights 07/18/2019 07/17/2019 07/16/2019  Weight (lbs) 185 lb 6.5 oz 173 lb 4.5 oz 177 lb 0.5 oz  Weight (kg) 84.1 kg 78.6 kg 80.3 kg     Body mass index is 32.84 kg/m.  General:  Moderately distressed and dyspneic HEENT: normal Neck: JVD at midneck Cardiac:  Tachycardic and regular. No murmur Lungs: crackles bilaterally  Abd: soft, nontender  Ext: no edema Musculoskeletal:  No deformities, BUE and BLE strength normal and equal Skin: warm and dry  Neuro:   no focal abnormalities noted Psych: Answers questions appropriately but unable to reconcile prior event  EKG:  As per HPI  Telemetry:  Sinus tachycardia   Relevant CV Studies:  Echocardiogram 07/17/2019:  1. Left ventricular ejection fraction, by visual estimation, is 55 to 60%. The left ventricle has normal function. There is mildly increased left ventricular hypertrophy.  2. Left ventricular diastolic Doppler parameters are indeterminate pattern of LV diastolic filling.  3. Global right ventricle has normal systolic function.The right ventricular size is normal. No increase in right ventricular wall thickness.  4. The mitral valve is normal in structure. Trace mitral valve regurgitation.  5. The  tricuspid valve is normal in structure. Tricuspid valve regurgitation is mild.  Nuclear stress test 11/2012 Rest Procedure:  Myocardial perfusion imaging was performed at rest 45 minutes following the intravenous administration of Technetium 75m Sestamibi. Stress Procedure:  The patient received IV Lexiscan 0.4 mg over 15-seconds.  Technetium 41m Sestamibi injected at 30-seconds.  There were no significant changes with Lexiscan.  Quantitative spect images were obtained after a 45 minute delay.  Transient Ischemic Dilatation (Normal <1.22):  0.80   Lung/Heart Ratio (Normal <0.45):  0.38 QGS EDV:  65 ml QGS ESV:  22 ml LV Ejection Fraction: 66%  Signed by    Rest ECG: NSR with 1st degree AVB  Stress ECG: There are scattered PVCs.  QPS Raw Data Images:  Normal; no motion artifact; normal heart/lung ratio. Stress Images:  Small fixed basal inferior bowel artifact. Rest Images:  Small fixed basal inferior bowel artifact. Subtraction (SDS):  No evidence of ischemia.  Impression Exercise Capacity:  Lexiscan with no exercise. BP Response:  Normal blood pressure response. Clinical Symptoms:  No significant symptoms noted. ECG Impression:  There are scattered PVCs. Comparison with Prior Nuclear Study: No previous nuclear study performed  Overall Impression:  Low risk stress nuclear study. Small basal inferior bowel attenuation artifact. No ischemia.  LV Wall Motion:  NL LV Function; NL Wall Motion; EF 66%. TID 0.8 (normal)  Laboratory Data:  High Sensitivity Troponin:   Recent Labs  Lab 07/16/19 2142 07/17/19 0206 07/17/19 0448 07/17/19 0631 07/18/19 1243  TROPONINIHS 391* 997* 1,179* 1,370* 3,396*     Chemistry Recent Labs  Lab 07/17/19 0206 07/17/19 1552 07/18/19 0226  NA 142 143 145  K 3.9 3.8 3.7  CL 117* 119* 119*  CO2 14* 16* 17*  GLUCOSE 86 110* 125*  BUN 59* 45* 37*  CREATININE 2.61* 1.98* 1.65*  CALCIUM 7.7* 7.9* 8.1*  GFRNONAA 23* 31* 39*  GFRAA  26* 36* 45*  ANIONGAP 11 8 9     Recent Labs  Lab 07/16/19 0909 07/17/19 0206  07/18/19 0226  PROT 5.7*  --   --   ALBUMIN 3.1* 2.3* 2.3*  AST 25  --   --   ALT 22  --   --   ALKPHOS 92  --   --   BILITOT 0.1*  --   --    Hematology Recent Labs  Lab 07/16/19 1731 07/17/19 0206 07/18/19 0226  WBC 15.6* 11.9* 12.4*  RBC 3.06* 2.89* 2.93*  HGB 9.6* 9.1* 9.1*  HCT 28.9* 27.1* 27.3*  MCV 94.4 93.8 93.2  MCH 31.4 31.5 31.1  MCHC 33.2 33.6 33.3  RDW 14.2 14.2 14.3  PLT 145* 133* 150   DDimer  Recent Labs  Lab 07/16/19 0928  DDIMER 1.38*   Radiology/Studies:  Ct Head Wo Contrast  Result Date: 07/16/2019 CLINICAL DATA:  Seizure-like activity and confusion EXAM: CT HEAD WITHOUT CONTRAST TECHNIQUE: Contiguous axial images were obtained from the base of the skull through the vertex without intravenous contrast. COMPARISON:  None. FINDINGS: Brain: There is moderate diffuse atrophy with atrophy most notable in the left parietal region. There is no intracranial mass, hemorrhage, extra-axial fluid collection, or midline shift. There is small vessel disease scattered throughout the centra semiovale bilaterally. No acute appearing infarct is demonstrable on this study. Vascular: There is no hyperdense vessel. There is calcification in each carotid siphon region. Skull: Bony calvarium appears intact. Sinuses/Orbits: There is mucosal thickening in several ethmoid air cells. Other visualized paranasal sinuses are clear. Orbits appear symmetric bilaterally. Other: There is opacification in several inferior mastoid air cells on the left. Mastoids elsewhere bilaterally are clear. IMPRESSION: Atrophy with supratentorial small vessel disease throughout portions of the periventricular white matter. No acute infarct is demonstrable. No evident mass or hemorrhage. Foci of arterial vascular calcification noted. Foci of paranasal sinus disease present. Inferior mastoid air cell disease on the left.  Electronically Signed   By: Lowella Grip III M.D.   On: 07/16/2019 10:49   Ct Angio Chest Pe W Or Wo Contrast  Result Date: 07/18/2019 CLINICAL DATA:  Cough and pneumonia. Elevated D-dimer. History of DVT. EXAM: CT ANGIOGRAPHY CHEST WITH CONTRAST TECHNIQUE: Multidetector CT imaging of the chest was performed using the standard protocol during bolus administration of intravenous contrast. Multiplanar CT image reconstructions and MIPs were obtained to evaluate the vascular anatomy. CONTRAST:  75mL OMNIPAQUE IOHEXOL 350 MG/ML SOLN COMPARISON:  Chest x-ray July 16, 2019 FINDINGS: Cardiovascular: Atherosclerotic changes are seen in the nonaneurysmal aorta. Coronary artery calcifications involve the right and left coronary arteries. The heart size borderline to mildly enlarged. No pulmonary emboli. Mediastinum/Nodes: The thyroid and esophagus are normal. Several mildly enlarged mediastinal nodes are identified. A right paratracheal node measures 1.6 cm on series 5, image 57. A 1.7 cm node is seen to the right of the esophagus on series 5, image 25. There is also some increased attenuation the mediastinal fat such as in the AP window on axial image 53. Lungs/Pleura: Central airways are unremarkable. No pneumothorax identified. Bilateral pulmonary infiltrates are multifocal, most prominent in the right upper lower lobes. There is also involvement of the right middle lobe, left upper lobe, and left lower lobe. Mild interlobular septal thickening. No pulmonary nodules or masses. Upper Abdomen: A small amount of ascites is seen adjacent to the liver on series 5, image 125. The upper abdomen is otherwise unremarkable. Musculoskeletal: No chest wall abnormality. No acute or significant osseous findings. Review of the MIP images confirms the above findings. IMPRESSION: 1. Multifocal infiltrates in the lungs  are most consistent with pneumonia. 2. Interlobular septal thickening and pleural effusions suggest  superimposed mild edema. 3. Shotty and mildly enlarged nodes in the mediastinum are likely reactive. Recommend attention on follow-up. 4. Increased attenuation scattered the mediastinal fat occluding in the AP window is nonspecific but could be due to volume overload. Recommend attention on follow-up. 5. Minimal ascites in the upper abdomen is nonspecific. Electronically Signed   By: Dorise Bullion III M.D   On: 07/18/2019 14:59   Dg Chest Port 1 View  Result Date: 07/18/2019 CLINICAL DATA:  Pneumonia.  Shortness of breath and volume overload. EXAM: PORTABLE CHEST 1 VIEW COMPARISON:  07/16/2019 FINDINGS: Mild cardiomegaly. Bilateral lower lobe and right upper lobe airspace opacities are identified. Mild diffuse superimposed pulmonary edema. Osseous structures appear intact. IMPRESSION: 1. Worsening bilateral airspace opacities with new superimposed pulmonary edema. Electronically Signed   By: Kerby Moors M.D.   On: 07/18/2019 19:02   Dg Chest Portable 1 View  Result Date: 07/16/2019 CLINICAL DATA:  Cough, febrile EXAM: PORTABLE CHEST 1 VIEW COMPARISON:  August 31, 2015 FINDINGS: There is mild cardiomegaly. Patchy airspace opacity seen in the right lower lung. The left lung is clear. IMPRESSION: Patchy airspace opacity in the right lower lung which could be due to early infectious etiology or atelectasis. Electronically Signed   By: Prudencio Pair M.D.   On: 07/16/2019 09:55    Assessment and Plan:   Elevated troponin Dyspnea Abnormal ECG The patient is hospitalized for sepsis due to PNA and AKI. He was evaluated by the cardiology consult service for elevated troponin that was attributed to demand ischemia. Echocardiogram was reassuring without any signs of acute ischemia. This evening, he is more dyspneic with a rising troponin. ECG does show new ST elevation in the mid to lateral precordial leads. He is currently chest pain free but has significant dyspnea and apparent volume overload. His  presentation is more likely consistent with symptomatic hypervolemia rather than ACS due to acute plaque rupture. At this time, would recommend continued symtpoms management and diuresis. Can continue to trend troponin. Please let cardiology know if there are further changes in his clinical status. Case was discussed with Dr. Irish Lack.    CAD As above, suspect demand ischemia to be more likely than ACS to be contributing to his presentation. He has known occluded RCA with collateralization  -Continue aspirin and statin  HTN BP elevated on my evaluation and may be contributing to flash pulmonary edema/hypervolemia -Continue diuresis as above -May benefit from NTG -Continue metoprolol     For questions or updates, please contact [facility] Please consult [email or web address] for contact info under     Signed, Nila Nephew, MD  07/18/2019 8:19 PM

## 2019-07-18 NOTE — Progress Notes (Signed)
     Please see Dr Emily Filbert original consult. From note patient note likely demand ischemia, patient not invasive candidate. No further cardiology recs at this time.     [facility] will sign off.   Medication Recommendations:  Continue current regimen Other recommendations (labs, testing, etc):  none Follow up as an outpatient:  We will arrange     Signed, Carlyle Dolly, MD  07/18/2019, 11:11 AM

## 2019-07-18 NOTE — Progress Notes (Signed)
FPTS Interim Progress Note  BP (!) 161/99 (BP Location: Left Arm)   Pulse (!) 104   Temp 98.9 F (37.2 C) (Axillary)   Resp (!) 24   Ht 5\' 3"  (1.6 m)   Wt 84.1 kg   SpO2 97%   BMI 32.84 kg/m    Reevaluated patient. Patient appears comfortable and is sleeping. Tachypnea has improved some, still slightly tachycardic to 106 in room. Saturated 96% on 5L O2. Has responded well to IV Lasix and has roughly 1100cc output in bag. Will continue to monitor for now.    Rory Percy, DO 07/18/2019, 11:08 PM PGY-3, [place] [facility] Service pager [phone]

## 2019-07-18 NOTE — Progress Notes (Addendum)
FPTS Interim Progress Note  BP (!) 161/99 (BP Location: Left Arm)   Pulse (!) 104   Temp 98.9 F (37.2 C) (Axillary)   Resp (!) 24   Ht 5\' 3"  (1.6 m)   Wt 84.1 kg   SpO2 97%   BMI 32.84 kg/m    Reevaluated Philip Hurst about 1 hour after Lasix dose given during rapid response. Continues to be tachypneic and tachycardic, speaking in few word sentences.  Saturated at 96% on 5L O2. Lung exam with mild bibasilar crackles without wheezes. Lasix given around 7pm with 375cc charted thus far. Raised head of bed up on exam. RR nurse note noted from earlier and agree with plan. Will continue to monitor UOP and if respiratory status not improving on recheck, will consider BiPAP.  Rory Percy, DO 07/18/2019, 9:11 PM PGY-3, [place] [facility] Service pager [phone]

## 2019-07-18 NOTE — Significant Event (Addendum)
Rapid Response Event Note  Overview: Respiratory Distress  Initial Focused Assessment: Called by nursing staff with concerns of acute shortness of breath and increased work of breathing. Per nurse, patient has increasing shortness of breath coupled with accessory muscle use. RN was in the process of paging the primary service. I arrived and upon arrival, RR was 26-30, + use of accessory muscles and increased SOB/WOB. Coarse crackles throughout, HR 100-110, SBP 160-180s, 93%-95% on 3L [place]. Skin warm and dry, patient appears uncomfortable and fatigued. + pursed lip breathing at times. 1L UOP per nurse this shift. FMTS MD came to bedside.  Interventions: -- STAT CXR (RRT Protocol)   Plan of Care: -- Diuresis > Lasix 40 mg IV x 1 -- If SOB/WOB are ongoing after diuresis - BIPAP ? -- Please re-assess, if no improvement in an hour or symptoms acutely worsen please call primary service and call RRT.  Event Summary:  Call Time [place] End Time 1902  Netty Starring

## 2019-07-18 NOTE — Progress Notes (Signed)
FPTS Interim Progress Note  S: RN report patient is shortness of breath. Pt states that he felt SOB like this when he was at home.  Denies chest pain.   O: BP (!) 152/100 (BP Location: Left Arm)   Pulse 92   Temp 98.1 F (36.7 C) (Oral)   Resp 20   Ht 5\' 3"  (1.6 m)   Wt 84.1 kg   SpO2 92%   BMI 32.84 kg/m    GEN: sitting on the side of the bed CV: regular rate and rhythm, no murmurs appreciated, brisk cap refill RESP: tachypnic, bilateral crackles and rhonchi, coughing on exam, speaking in full sentences, wearing 3 L  SKIN: warm, dry    A/P: Pneumonia Patient had post tussive emesis. Discontinued fluids. EKG ordered.  Patient had CTA today that did not show PE.  Pneumonia was noted and possible pulmonary edema. Heparin was discontinued after the CT Chest resulted.  Lasix concerned however patient received IV contrast today in the setting of concurrent AKI. Reassess in one hour.   Lyndee Hensen, MD  07/18/2019, 5:39 PM PGY-1, [place] [facility] Service pager [phone]

## 2019-07-18 NOTE — Progress Notes (Signed)
Passed off to night shift RN. CN aware. Still having increased work of breathing. Maintaining spO2 95% on 5L. Placing Condom Cath on pt for assistance with diuresis.   Vital Signs MEWS/VS Documentation      07/18/2019 1654 07/18/2019 1728 07/18/2019 1839 07/18/2019 1916   MEWS Score:  0  1  2  4    MEWS Score Color:  Green  Green  Yellow  Red   Resp:  20  -  (!) 22  (!) 26   Pulse:  92  -  (!) 104  (!) 114   BP:  (!) 152/100  -  (!) 168/107  (!) 179/107   Temp:  98.1 F (36.7 C)  -  -  -   O2 Device:  Nasal Cannula  Nasal Cannula  Nasal Cannula  Nasal Cannula   O2 Flow Rate (L/min):  2 L/min  3 L/min  5 L/min  5 L/min   Level of Consciousness:  -  -  -  Alert           Venida Jarvis 07/18/2019,7:29 PM

## 2019-07-18 NOTE — Progress Notes (Signed)
Family Medicine Teaching Service Daily Progress Note Intern Pager: [phone]  Patient name: Philip Hurst Medical record number: [id number] Date of birth: 1940-11-18 Age: 78 y.o. Gender: male  Primary Care Provider: Leonard Downing, MD Consultants: Cardiology Code Status:   Code Status: Full Code   Pt Overview and Major Events to Date:  07/16/19: Patient admitted to progressive unit 07/17/19:  Trop 1370, Heparin, Cardiology consulted   Assessment and Plan: Philip Hurst is a 78 y.o. male presenting with episode of confusion earlier this morning and few month h/o "not feeling right." PMH is significant for HTN, CAD, HLD, HTN, PAD, former smoker (currently dips)   Pneumonia  possible new lung cancer Patient afebrile overnight.  Reports he is feeling fine this morning.  Denies pain and shortness of breath.  Cardiology saw patient yesterday for uptrending troponins to 1370.  Per cardiology, elevated troponins likely from demand ischemia related to his acute medical illness.  No cath recommended. ECHO EF 55-60% with mildly increased LVH.  S/P is less likely consider discontinuing heparin.  - Consider CT Chest to evaluate for PE or malignancy, non contrast vs wait for resolution of AKI  - Outpatient follow-up with cardiology for noninvasive ischemia testing - Consider discontinuing Heparin  - Continue CTX, azithro (9/24- )  - strep pneumo urinary antigen: Negative - continuous pulse ox - AM CBC/BMP - f/u blood cx : No growth 48 hours  Acute on Chronic Kidney Disease Stage III Cr 1.65 from 2.61 yesterday, baseline (~1.3, last record in 2016).  Likely secondary to dehydration in the setting of diarrhea due to opiate withdrawal, chronic NSAID use for chronic pain and history of poor oral intake.  - continue mIVF - Hold antihypertensives, NSAIDs - Avoid nephrotoxic agents  AMS Resolved.  This morning, patient is alert and oriented to person place and year. Wife reported a similar episode  of altered mental status when patient was going through opiate withdrawal about a month ago as he was having diarrhea.   -Home opioid medication for chronic pain resumed  HTN Stable.  BP range 114/77-155/75.  - On olmesartan 40mg  0.5-1 tablet daily at home. 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  Diarrhea has resolved. Home regimen includes hydrocodone 10mg  q6h prn, Xtampza ER 13.5mg  q12h, gabapentin, ibuprofen 400mg  daily at home.  - Disontinue OxyContin 10 mg q12h based on formulary - Restart home hydrocodone 10 mg q6h PN - Continue gabapentin - Hold ibuprofen in setting of AKI - Miralax and senna PRN  CAD Stable.  On ASA 81mg , statin, ARB at home.   - Continue home statin and aspirin  - hold ARB due to AKI  PAD Stable. On ASA 81mg , atorvastatin at home. - continue home ASA and statin  -Outpatient follow-up for ongoing claudication  HLD On atorvastatin 10mg  at home. - continue home medication  Allergies - continue home loratadine  FEN/GI: Heart healthy, mIVF Prophylaxis: lovenox  Disposition: Likely home pending medical clearance  Subjective:  Philip Hurst has no concerns this morning.  No significant overnight events  Objective: Temp:  [98.3 F (36.8 C)-99.3 F (37.4 C)] 98.6 F (37 C) (09/26 0345) Pulse Rate:  [78-81] 79 (09/26 0345) Resp:  [16-22] 16 (09/26 0345) BP: (114-155)/(63-93) 153/93 (09/26 0345) SpO2:  [96 %-98 %] 98 % (  09/26 0345) Weight:  [84.1 kg] 84.1 kg (09/26 0419)  Physical Exam:  GEN: Alert and oriented, elderly male, sitting upright, no acute distress CV: regular rate and rhythm, no murmurs appreciated  RESP: no increased work of breathing, clear to ascultation bilaterally with no crackles, wheezes, or rhonchi  ABD: Bowel sounds present. Soft,  Nontender MSK: no lower extremity edema or tenderness SKIN: warm, dry NEURO: grossly normal, moves all extremities appropriately PSYCH: Normal affect and thought content    Laboratory: Recent Labs  Lab 07/16/19 1731 07/17/19 0206 07/18/19 0226  WBC 15.6* 11.9* 12.4*  HGB 9.6* 9.1* 9.1*  HCT 28.9* 27.1* 27.3*  PLT 145* 133* 150   Recent Labs  Lab 07/16/19 0909  07/17/19 0206 07/17/19 1552 07/18/19 0226  NA 135   < > 142 143 145  K 4.0   < > 3.9 3.8 3.7  CL 104   < > 117* 119* 119*  CO2 14*   < > 14* 16* 17*  BUN 93*   < > 59* 45* 37*  CREATININE 4.89*   < > 2.61* 1.98* 1.65*  CALCIUM 7.5*   < > 7.7* 7.9* 8.1*  PROT 5.7*  --   --   --   --   BILITOT 0.1*  --   --   --   --   ALKPHOS 92  --   --   --   --   ALT 22  --   --   --   --   AST 25  --   --   --   --   GLUCOSE 103*   < > 86 110* 125*   < > = values in this interval not displayed.     Imaging/Diagnostic Tests:  No results found.    Philip Hensen, MD 07/18/2019, 7:17 AM PGY-1, [place] Intern pager: [phone], text pages welcome

## 2019-07-18 NOTE — Progress Notes (Signed)
[place] for heparin Indication: pulmonary embolus vs ACS  Allergies  Allergen Reactions  . Plavix [Clopidogrel Bisulfate] Itching and Rash    Patient Measurements: Height: 5\' 3"  (160 cm) Weight: 185 lb 6.5 oz (84.1 kg) IBW/kg (Calculated) : 56.9 Heparin Dosing Weight: 73.9kg  Vital Signs: Temp: 98.6 F (37 C) (09/26 0345) Temp Source: Oral (09/26 0345) BP: 153/93 (09/26 0345) Pulse Rate: 79 (09/26 0345)  Labs: Recent Labs    07/16/19 1731  07/17/19 0206 07/17/19 0448 07/17/19 0631 07/17/19 1552 07/17/19 2125 07/18/19 0226  HGB 9.6*  --  9.1*  --   --   --   --  9.1*  HCT 28.9*  --  27.1*  --   --   --   --  27.3*  PLT 145*  --  133*  --   --   --   --  150  HEPARINUNFRC  --   --   --   --   --  0.43 0.35 0.24*  CREATININE 3.49*  --  2.61*  --   --  1.98*  --  1.65*  TROPONINIHS 129*   < > 997* 1,179* 1,370*  --   --   --    < > = values in this interval not displayed.    Estimated Creatinine Clearance: 35.4 mL/min (A) (by C-G formula based on SCr of 1.65 mg/dL (H)).   Assessment: 17 YOM presenting with sepsis, now with concern for PE vs ACS. Pharmacy consulted to manage IV heparin.  Heparin level is subtherapeutic at 0.24 on 1200 units/hr. Heparin level previously therapeutic though downtrending. Per nursing, no IV access issues. Will increase heparin rate. Hgb 9.1 and plt 150. No bleeding noted. Troponin trending up on 9/25 AM. No repeat troponin since.   Goal of Therapy:  Heparin level 0.3-0.7 units/ml Monitor platelets by anticoagulation protocol: Yes   Plan:  Increase heparin drip to 1350 units/hr Check 8 hour heparin level at 1530 Monitor daily heparin level, CBC, and s/sx of bleeding Follow up work up for ACS vs. PE   Thank you for involving pharmacy in this patient's care.  Cristela Felt, PharmD PGY1 Pharmacy Resident [employer]: [phone]  07/18/2019 6:49 AM

## 2019-07-18 NOTE — Progress Notes (Signed)
FPTS Interim Progress Note  S: Alerted by patient's nurse that patient's oxygen requirement had increased to 5 L and he has become more tachypneic with increased work of breathing.  Patient denies chest pain or any other new pain.  He does report shortness of breath.  O: BP (!) 179/107 (BP Location: Left Arm)   Pulse (!) 114   Temp 98.1 F (36.7 C) (Oral)   Resp (!) 26   Ht 5\' 3"  (1.6 m)   Wt 84.1 kg   SpO2 93%   BMI 32.84 kg/m   General: Elderly appearing gentleman lying in bed in mild distress Cardiac: Regular rate and rhythm, no murmurs rubs or gallops Respiratory: Tachypneic with increased work of breathing on 5 L of oxygen, saturation in the mid 90s, reduced air movement in lower lobes with crackles Extremities: No pedal edema, warm, moving spontaneously  A/P: Acute hypoxic respiratory distress: Patient with pneumonia and possible fluid overload as shown on CTA.  Unclear reason for volume overload since echo was largely within normal limits.  Although patient is recovering from an AKI and received contrast earlier today, will give Lasix 40 mg IV once to reduce volume overload.  Will use BiPAP if no improvement is noted an hour after patient receives Lasix.  Of note, a troponin was drawn at around noon today that was increased to 3000, likely in the setting of demand ischemia.  EKG shows ST elevations in V3, V4, and V5 that appear most consistent with repolarization abnormality, possible demand ischemia as well.  Will restart heparin drip due to large increase in troponin and EKG changes and consult cardiology.  Kathrene Alu, MD 07/18/2019, 7:27 PM PGY-3, [place] Service pager [phone]'

## 2019-07-18 NOTE — Progress Notes (Signed)
Pt ambulated in hall ~13ft with one assist and walker. Felt weak and was short of breath, needed rest breaks. HR 93, SpO2 93% on room air after ambulation. Up in chair now. Will monitor.

## 2019-07-18 NOTE — Progress Notes (Signed)
PT Cancellation Note  Patient Details Name: Philip Hurst MRN: [id number] DOB: January 11, 1941   Cancelled Treatment:    Reason Eval/Treat Not Completed: Other (comment). Pt amb in hall earlier with nursing. Request to rest. Will follow up tomorrow.    [place] 07/18/2019, 3:30 PM [name] [place] Pager [phone] Office [phone]

## 2019-07-18 NOTE — Significant Event (Signed)
Follow up Rapid Response Event Note  Overview: Acute Respiratory Distress (Volume overload)  Initial Focused Assessment: Philip Hurst is alert, oriented to person, place and situation. He still states he is SOB, but a little better than an hour ago. He is able to speak in 2-3 word phrases. HR 104 ST, 161/99 (118), RR 24-26 with sats 97% on 5L [place]. BBS coarse rales in bilateral bases. Moderate WOB with abdominal accessory muscle use. Lasix 40mg  IV given at [place]. Approximately 375 cc out since. Condom cath in use. Will monitor diuresis and WOB.    Plan of Care (if not transferred): -Monitor WOB and further response to lasix -Will reassess in about an hour to assess response to lasix and WOB -Notify primary svc and/or RRRN for any further issues    Madelynn Done

## 2019-07-18 NOTE — Progress Notes (Signed)
[facility] for heparin Indication: pulmonary embolus vs ACS  Allergies  Allergen Reactions  . Plavix [Clopidogrel Bisulfate] Itching and Rash    Patient Measurements: Height: 5\' 3"  (160 cm) Weight: 185 lb 6.5 oz (84.1 kg) IBW/kg (Calculated) : 56.9 Heparin Dosing Weight: 73.9kg  Vital Signs: Temp: 98.1 F (36.7 C) (09/26 1654) Temp Source: Oral (09/26 1654) BP: 179/107 (09/26 1916) Pulse Rate: 114 (09/26 1916)  Labs: Recent Labs    07/16/19 1731  07/17/19 0206 07/17/19 0448 07/17/19 0631 07/17/19 1552 07/17/19 2125 07/18/19 0226 07/18/19 1243  HGB 9.6*  --  9.1*  --   --   --   --  9.1*  --   HCT 28.9*  --  27.1*  --   --   --   --  27.3*  --   PLT 145*  --  133*  --   --   --   --  150  --   HEPARINUNFRC  --   --   --   --   --  0.43 0.35 0.24*  --   CREATININE 3.49*  --  2.61*  --   --  1.98*  --  1.65*  --   TROPONINIHS 129*   < > 997* 1,179* 1,370*  --   --   --  3,396*   < > = values in this interval not displayed.    Estimated Creatinine Clearance: 35.4 mL/min (A) (by C-G formula based on SCr of 1.65 mg/dL (H)).   Assessment: 67 YOM presenting with sepsis, now with concern for PE vs ACS. Pharmacy consulted to manage IV heparin.  Heparin was stopped earlier today with thoughts that this was demand ischemia. Pt now with worsening respiratory status so pharmacy asked to resume.   Goal of Therapy:  Heparin level 0.3-0.7 units/ml Monitor platelets by anticoagulation protocol: Yes   Plan:  Restart heparin drip at 1350 units/hr Check 8hr heparin level    Arrie Senate, PharmD, BCPS Clinical Pharmacist [phone] Please check AMION for all [facility] Pharmacy numbers 07/18/2019

## 2019-07-19 ENCOUNTER — Inpatient Hospital Stay (HOSPITAL_COMMUNITY): Payer: Medicare Other

## 2019-07-19 ENCOUNTER — Encounter (HOSPITAL_COMMUNITY)
Admission: EM | Disposition: A | Discharge status: Home Health | Payer: Self-pay | Source: Home / Self Care | Attending: Thoracic Surgery (Cardiothoracic Vascular Surgery)

## 2019-07-19 DIAGNOSIS — R079 Chest pain, unspecified: Secondary | ICD-10-CM

## 2019-07-19 DIAGNOSIS — I214 Non-ST elevation (NSTEMI) myocardial infarction: Secondary | ICD-10-CM

## 2019-07-19 DIAGNOSIS — R0602 Shortness of breath: Secondary | ICD-10-CM

## 2019-07-19 DIAGNOSIS — I5021 Acute systolic (congestive) heart failure: Secondary | ICD-10-CM

## 2019-07-19 DIAGNOSIS — I25110 Atherosclerotic heart disease of native coronary artery with unstable angina pectoris: Secondary | ICD-10-CM

## 2019-07-19 DIAGNOSIS — I25119 Atherosclerotic heart disease of native coronary artery with unspecified angina pectoris: Secondary | ICD-10-CM

## 2019-07-19 HISTORY — PX: LEFT HEART CATH AND CORONARY ANGIOGRAPHY: CATH118249

## 2019-07-19 LAB — ECHOCARDIOGRAM COMPLETE
Height: 63 in
Weight: 2881.85 oz

## 2019-07-19 LAB — BASIC METABOLIC PANEL
Anion gap: 12 (ref 5–15)
BUN: 38 mg/dL — ABNORMAL HIGH (ref 8–23)
CO2: 15 mmol/L — ABNORMAL LOW (ref 22–32)
Calcium: 8.2 mg/dL — ABNORMAL LOW (ref 8.9–10.3)
Chloride: 115 mmol/L — ABNORMAL HIGH (ref 98–111)
Creatinine, Ser: 1.76 mg/dL — ABNORMAL HIGH (ref 0.61–1.24)
GFR calc Af Amer: 42 mL/min — ABNORMAL LOW (ref >60)
GFR calc non Af Amer: 36 mL/min — ABNORMAL LOW (ref >60)
Glucose, Bld: 148 mg/dL — ABNORMAL HIGH (ref 70–99)
Potassium: 4 mmol/L (ref 3.5–5.1)
Sodium: 142 mmol/L (ref 135–145)

## 2019-07-19 LAB — CBC
HCT: 29.6 % — ABNORMAL LOW (ref 39.0–52.0)
Hemoglobin: 10.4 g/dL — ABNORMAL LOW (ref 13.0–17.0)
MCH: 32.1 pg (ref 26.0–34.0)
MCHC: 35.1 g/dL (ref 30.0–36.0)
MCV: 91.4 fL (ref 80.0–100.0)
Platelets: 197 10*3/uL (ref 150–400)
RBC: 3.24 MIL/uL — ABNORMAL LOW (ref 4.22–5.81)
RDW: 14.5 % (ref 11.5–15.5)
WBC: 15.1 10*3/uL — ABNORMAL HIGH (ref 4.0–10.5)
nRBC: 0 % (ref 0.0–0.2)

## 2019-07-19 LAB — TROPONIN I (HIGH SENSITIVITY)
Troponin I (High Sensitivity): 3206 ng/L (ref <18)
Troponin I (High Sensitivity): 2175 ng/L (ref <18)

## 2019-07-19 LAB — MAGNESIUM: Magnesium: 1.7 mg/dL (ref 1.7–2.4)

## 2019-07-19 LAB — HEPARIN LEVEL (UNFRACTIONATED)
Heparin Unfractionated: 0.32 IU/mL (ref 0.30–0.70)
Heparin Unfractionated: 0.52 IU/mL (ref 0.30–0.70)

## 2019-07-19 SURGERY — LEFT HEART CATH AND CORONARY ANGIOGRAPHY
Anesthesia: LOCAL

## 2019-07-19 MED ORDER — MAGNESIUM SULFATE 2 GM/50ML IV SOLN
2.0000 g | Freq: Once | INTRAVENOUS | Status: AC
  Administered 2019-07-19: 09:00:00 2 g via INTRAVENOUS
  Filled 2019-07-19: qty 50

## 2019-07-19 MED ORDER — SODIUM CHLORIDE 0.9 % IV SOLN
INTRAVENOUS | Status: AC | PRN
  Administered 2019-07-19: 10 mL/h via INTRAVENOUS

## 2019-07-19 MED ORDER — SODIUM CHLORIDE 0.9% FLUSH: 3.0000 mL | Freq: Two times a day (BID) | INTRAVENOUS | Status: DC

## 2019-07-19 MED ORDER — LIDOCAINE HCL (PF) 1 % IJ SOLN
INTRAMUSCULAR | Status: AC
  Filled 2019-07-19: qty 30

## 2019-07-19 MED ORDER — HYDRALAZINE HCL 20 MG/ML IJ SOLN: 10.0000 mg | INTRAMUSCULAR | Status: AC | PRN

## 2019-07-19 MED ORDER — NITROGLYCERIN 0.4 MG SL SUBL
0.4000 mg | SUBLINGUAL_TABLET | Freq: Once | SUBLINGUAL | Status: AC
  Administered 2019-07-19: 0.4 mg via SUBLINGUAL

## 2019-07-19 MED ORDER — HEPARIN SODIUM (PORCINE) 1000 UNIT/ML IJ SOLN
INTRAMUSCULAR | Status: AC
  Filled 2019-07-19: qty 1

## 2019-07-19 MED ORDER — CHLORHEXIDINE GLUCONATE CLOTH 2 % EX PADS
6.0000 | MEDICATED_PAD | Freq: Every day | CUTANEOUS | Status: DC
  Administered 2019-07-19 – 2019-07-26 (×6): 6 via TOPICAL

## 2019-07-19 MED ORDER — ONDANSETRON HCL 4 MG/2ML IJ SOLN: 4.0000 mg | Freq: Four times a day (QID) | INTRAMUSCULAR | Status: DC | PRN

## 2019-07-19 MED ORDER — HEPARIN SODIUM (PORCINE) 1000 UNIT/ML IJ SOLN
INTRAMUSCULAR | Status: DC | PRN
  Administered 2019-07-19: 4000 [IU] via INTRAVENOUS

## 2019-07-19 MED ORDER — FUROSEMIDE 10 MG/ML IJ SOLN
80.0000 mg | Freq: Once | INTRAMUSCULAR | Status: AC
  Administered 2019-07-19: 11:00:00 80 mg via INTRAVENOUS
  Filled 2019-07-19: qty 8

## 2019-07-19 MED ORDER — ATORVASTATIN CALCIUM 80 MG PO TABS
80.0000 mg | ORAL_TABLET | Freq: Every day | ORAL | Status: DC
  Administered 2019-07-20 – 2019-07-28 (×8): 80 mg via ORAL
  Filled 2019-07-19 (×8): qty 1

## 2019-07-19 MED ORDER — VERAPAMIL HCL 2.5 MG/ML IV SOLN
INTRAVENOUS | Status: AC
  Filled 2019-07-19: qty 2

## 2019-07-19 MED ORDER — HEPARIN (PORCINE) IN NACL 1000-0.9 UT/500ML-% IV SOLN
INTRAVENOUS | Status: DC | PRN
  Administered 2019-07-19 (×2): 500 mL

## 2019-07-19 MED ORDER — HEPARIN (PORCINE) IN NACL 1000-0.9 UT/500ML-% IV SOLN
INTRAVENOUS | Status: AC
  Filled 2019-07-19: qty 1000

## 2019-07-19 MED ORDER — SODIUM CHLORIDE 0.9 % IV SOLN: 250.0000 mL | INTRAVENOUS | Status: DC | PRN

## 2019-07-19 MED ORDER — IOHEXOL 350 MG/ML SOLN
INTRAVENOUS | Status: DC | PRN
  Administered 2019-07-19: 58 mL via INTRA_ARTERIAL

## 2019-07-19 MED ORDER — SODIUM CHLORIDE 0.9% FLUSH
3.0000 mL | Freq: Two times a day (BID) | INTRAVENOUS | Status: DC
  Administered 2019-07-19: 21:00:00 3 mL via INTRAVENOUS

## 2019-07-19 MED ORDER — VERAPAMIL HCL 2.5 MG/ML IV SOLN
INTRAVENOUS | Status: DC | PRN
  Administered 2019-07-19: 14:00:00 10 mL via INTRA_ARTERIAL

## 2019-07-19 MED ORDER — SODIUM CHLORIDE 0.9% FLUSH: 3.0000 mL | INTRAVENOUS | Status: DC | PRN

## 2019-07-19 MED ORDER — NITROGLYCERIN IN D5W 200-5 MCG/ML-% IV SOLN
20.0000 ug/min | INTRAVENOUS | Status: DC
  Administered 2019-07-19: 15:00:00 5 ug/min via INTRAVENOUS
  Filled 2019-07-19: qty 250

## 2019-07-19 MED ORDER — HEPARIN (PORCINE) 25000 UT/250ML-% IV SOLN
1600.0000 [IU]/h | INTRAVENOUS | Status: DC
  Administered 2019-07-19 – 2019-07-20 (×2): 1350 [IU]/h via INTRAVENOUS
  Administered 2019-07-21: 22:00:00 1600 [IU]/h via INTRAVENOUS
  Administered 2019-07-21: 1450 [IU]/h via INTRAVENOUS
  Filled 2019-07-19 (×4): qty 250

## 2019-07-19 MED ORDER — LABETALOL HCL 5 MG/ML IV SOLN: 10.0000 mg | INTRAVENOUS | Status: AC | PRN

## 2019-07-19 MED ORDER — LIDOCAINE HCL (PF) 1 % IJ SOLN
INTRAMUSCULAR | Status: DC | PRN
  Administered 2019-07-19: 2 mL

## 2019-07-19 MED ORDER — SODIUM CHLORIDE 0.9 % WEIGHT BASED INFUSION
1.0000 mL/kg/h | INTRAVENOUS | Status: DC
  Administered 2019-07-19: 1 mL/kg/h via INTRAVENOUS

## 2019-07-19 MED ORDER — SODIUM CHLORIDE 0.9 % IV SOLN: INTRAVENOUS | Status: DC

## 2019-07-19 MED ORDER — ACETAMINOPHEN 325 MG PO TABS
650.0000 mg | ORAL_TABLET | ORAL | Status: DC | PRN
  Administered 2019-07-20 – 2019-07-21 (×2): 650 mg via ORAL
  Filled 2019-07-19 (×2): qty 2

## 2019-07-19 MED ORDER — NITROGLYCERIN 0.4 MG SL SUBL
SUBLINGUAL_TABLET | SUBLINGUAL | Status: AC
  Administered 2019-07-19: 09:00:00 0.4 mg
  Filled 2019-07-19: qty 1

## 2019-07-19 SURGICAL SUPPLY — 11 items
CATH 5FR JL3.5 JR4 ANG PIG MP (CATHETERS) ×1 IMPLANT
DEVICE RAD COMP TR BAND LRG (VASCULAR PRODUCTS) ×1 IMPLANT
ELECT DEFIB PAD ADLT CADENCE (PAD) ×1 IMPLANT
GLIDESHEATH SLEND A-KIT 6F 22G (SHEATH) ×1 IMPLANT
GUIDEWIRE INQWIRE 1.5J.035X260 (WIRE) IMPLANT
INQWIRE 1.5J .035X260CM (WIRE) ×2
KIT HEART LEFT (KITS) ×2 IMPLANT
PACK CARDIAC CATHETERIZATION (CUSTOM PROCEDURE TRAY) ×2 IMPLANT
SHEATH PROBE COVER 6X72 (BAG) ×1 IMPLANT
TRANSDUCER W/STOPCOCK (MISCELLANEOUS) ×2 IMPLANT
TUBING CIL FLEX 10 FLL-RA (TUBING) ×2 IMPLANT

## 2019-07-19 NOTE — Progress Notes (Signed)
On morning assessment pt having difficulty breathing w/ RR at 30. Placed on Venturi mask at 6L and 30%. SpO2 95%. Pt also c/o CP 6/10. Norco 10-325 given at [phone]. Page sent to [facility], MD on call. Will continue to monitor and await further instructions.

## 2019-07-19 NOTE — CV Procedure (Signed)
   Left heart cath with coronary angiogram via the right radial approach.  Contrast limitations due to acute kidney injury.  Total contrast 58 cc.  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  Obtuse marginal 50%.  LAD heavily calcified and severely diseased from ostium to mid vessel 50 to 90% in different spots.  LVEDP 20 mmHg  Start IV nitroglycerin, diuresis as tolerated, TCTS consultation for surgical revascularization.  Follow kidney function closely.

## 2019-07-19 NOTE — Progress Notes (Signed)
Family Medicine Teaching Service Daily Progress Note Intern Pager: [phone]  Patient name: Philip Hurst Medical record number: [id number] Date of birth: 12-26-1940 Age: 78 y.o. Gender: male  Primary Care Provider: Leonard Downing, MD Consultants: Cardiology Code Status:   Code Status: Full Code   Pt Overview and Major Events to Date:  07/16/19: Patient admitted to progressive unit 07/17/19:  Trop 1370, Heparin, Cardiology consulted, ECHO:  EF 55-60% with mildly increased LVH 07/18/19: CTA negative for PE  Assessment and Plan: Philip Hurst is a 78 y.o. male presenting with episode of confusion earlier this morning and few month h/o "not feeling right." PMH is significant for HTN, CAD, HLD, HTN, PAD, former smoker (currently dips)  Acute Respiratory Failure  Pneumonia  Pulmonary Edema Worsening dyspnea overnight. Repeat CXR notable for worsening bilateral airspace opacities with new superimposed pulmonary edema. CTA negative for PE. Fluids discontinued. Received 40mg  IV lasix x 2 overnight with 2.7L UOP. Still continues to be tachypniec in the 30's requirinmg 6L O2 this AM. Continues to have increased work of breathing and cough of cream sputum. Leukocytosis worsening 12.4>15.4. Remained afebrile. Blood culture negative x 2 days. 1V repeat CXR notable for stable bilateral lung opacities. - continues heparin drip - consider BiPAP if continues to worsen - consider redose 40mg  IV lasix this AM pending cards recs - S/p CTX and azithro (9/24- 9/26) - continue Cefdinir (9/26-) - continuous pulse ox - AM CBC/BMP  Elevated Troponins  CAD: Troponins up-trended overnight from [id number]. Initial EKG concerning for new ST elevations with 1-32mm ST elevation in V3, ~7mm ST elevation in V4-V5. Cards consulted and felt likely demand ischemia. Subsequent repeat EKG with sinus tach with 1-2 mm ST elevation in V2 and V3 and subtle elevation in V4 (mid to lateral precordial leads). Per cards, recommend  symptom management and diuresis. Patient has known occlusion of RCA with collateralization. This morning patient endorses 6/10 non reproducable substernal chest pain that feels like "pressure" particularly worse when breathing in. Improvement in chest pain with SL Nitroglycerine.  1V repeat CXR notable for stable bilateral lung opacities. Given new chest pain, repeat EKG obtained and notable for ST elevation in V4, ST depression in I, V5-6.  - re-consult cards, will follow up recs - will begin trending trops - repeat EKG this AM given new chest pain  - reconsult cards pending above results - continue aspirin and statin - continue heparin infusion - give SL NTG x 1 to evaluate for improvement in chest pain  Acute on Chronic Kidney Disease Stage III Cr 1.98>1.65>1.76. Baseline (~1.3, last record in 2016). May be due to hypervolemia given worsening pulmonary edema - hold mIVF - Hold antihypertensives, NSAIDs - Avoid nephrotoxic agents - daily BMP - consider redose in lasix pending cards recs   AMS Resolved. A&Ox 3 this AM (to person, place, and time, but not reason for hospitalization).  HTN: worsening Elevated BP's overnight 140-160/90-100's. 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 Patient tends to use more often than prescribed.  - Disontinue OxyContin 10 mg q12h based on formulary - Continue home Noroc10-325mg  q6h PRN - Continue gabapentin - Hold ibuprofen in setting of AKI - Miralax and senna PRN  PAD Stable. Home meds: ASA 81mg  and  Atorvastatin 10mg .  - continue home ASA - continue increased Atorvastatin 40mg  QD -Outpatient follow-up for ongoing claudication  HLD On atorvastatin 10mg  at home. - continue  increased Lipitor 40mg  QD  Allergies - continue home loratadine  FEN/GI: Heart healthy Prophylaxis: Heparin dtt  Disposition: Likely home pending medical clearance  Subjective:  Philip Hurst  Continues to remain SOB with increased work of breathing this morning. He complains of 6/10 chest pain in the center of his chest that is worse when breathing in.   Objective: Temp:  [97.4 F (36.3 C)-98.9 F (37.2 C)] 97.8 F (36.6 C) (09/27 0752) Pulse Rate:  [83-114] 98 (09/27 0752) Resp:  [16-30] 30 (09/27 0724) BP: (125-179)/(86-107) 150/93 (09/27 0752) SpO2:  [92 %-99 %] 95 % (09/27 0752) Weight:  [81.7 kg] 81.7 kg (09/27 0427)  Physical Exam: General: appears unwell and pale on exam and breathing heavy with [place] on 6L CV: mildly tachycardic, regular rhythm without murmurs, rubs, or gallops, no lower extremity edema bilaterally, bounding radial pulses Lungs: fine crackles in lower lobes bilaterally, rhonchi throughout, increased work of breathing with use of abdominal muscles  Abdomen: soft, non-tender, non-distended,normoactive bowel sounds Skin: warm, dry in all extremities  Extremities: warm and well perfused Neuro: Alert and orientedx3, speech normal  Laboratory: Recent Labs  Lab 07/17/19 0206 07/18/19 0226 07/19/19 0410  WBC 11.9* 12.4* 15.1*  HGB 9.1* 9.1* 10.4*  HCT 27.1* 27.3* 29.6*  PLT 133* 150 197   Recent Labs  Lab 07/16/19 0909  07/17/19 1552 07/18/19 0226 07/19/19 0410  NA 135   < > 143 145 142  K 4.0   < > 3.8 3.7 4.0  CL 104   < > 119* 119* 115*  CO2 14*   < > 16* 17* 15*  BUN 93*   < > 45* 37* 38*  CREATININE 4.89*   < > 1.98* 1.65* 1.76*  CALCIUM 7.5*   < > 7.9* 8.1* 8.2*  PROT 5.7*  --   --   --   --   BILITOT 0.1*  --   --   --   --   ALKPHOS 92  --   --   --   --   ALT 22  --   --   --   --   AST 25  --   --   --   --   GLUCOSE 103*   < > 110* 125* 148*   < > = values in this interval not displayed.     Imaging/Diagnostic Tests:  Ct Angio  Chest Pe W Or Wo Contrast  Result Date: 07/18/2019 CLINICAL DATA:  Cough and pneumonia. Elevated D-dimer. History of DVT. EXAM: CT ANGIOGRAPHY CHEST WITH CONTRAST TECHNIQUE: Multidetector CT imaging of the chest was performed using the standard protocol during bolus administration of intravenous contrast. Multiplanar CT image reconstructions and MIPs were obtained to evaluate the vascular anatomy. CONTRAST:  10mL OMNIPAQUE IOHEXOL 350 MG/ML SOLN COMPARISON:  Chest x-ray July 16, 2019 FINDINGS: Cardiovascular: Atherosclerotic changes are seen in the nonaneurysmal aorta. Coronary artery calcifications involve the right and left coronary arteries. The heart size borderline to mildly enlarged. No pulmonary emboli. Mediastinum/Nodes: The thyroid and esophagus are normal. Several mildly enlarged mediastinal nodes are identified. A right paratracheal node measures 1.6 cm on series 5, image 57. A 1.7 cm node is seen to the right of the esophagus on series 5, image 25. There is also some increased attenuation the mediastinal fat such as in the  AP window on axial image 53. Lungs/Pleura: Central airways are unremarkable. No pneumothorax identified. Bilateral pulmonary infiltrates are multifocal, most prominent in the right upper lower lobes. There is also involvement of the right middle lobe, left upper lobe, and left lower lobe. Mild interlobular septal thickening. No pulmonary nodules or masses. Upper Abdomen: A small amount of ascites is seen adjacent to the liver on series 5, image 125. The upper abdomen is otherwise unremarkable. Musculoskeletal: No chest wall abnormality. No acute or significant osseous findings. Review of the MIP images confirms the above findings. IMPRESSION: 1. Multifocal infiltrates in the lungs are most consistent with pneumonia. 2. Interlobular septal thickening and pleural effusions suggest superimposed mild edema. 3. Shotty and mildly enlarged nodes in the mediastinum are likely reactive.  Recommend attention on follow-up. 4. Increased attenuation scattered the mediastinal fat occluding in the AP window is nonspecific but could be due to volume overload. Recommend attention on follow-up. 5. Minimal ascites in the upper abdomen is nonspecific. Electronically Signed   By: Dorise Bullion III M.D   On: 07/18/2019 14:59   Dg Chest Port 1 View  Result Date: 07/18/2019 CLINICAL DATA:  Pneumonia.  Shortness of breath and volume overload. EXAM: PORTABLE CHEST 1 VIEW COMPARISON:  07/16/2019 FINDINGS: Mild cardiomegaly. Bilateral lower lobe and right upper lobe airspace opacities are identified. Mild diffuse superimposed pulmonary edema. Osseous structures appear intact. IMPRESSION: 1. Worsening bilateral airspace opacities with new superimposed pulmonary edema. Electronically Signed   By: Kerby Moors M.D.   On: 07/18/2019 19:02      Danna Hefty, DO 07/19/2019, 8:16 AM PGY-1, [place] Intern pager: [phone], text pages welcome

## 2019-07-19 NOTE — Significant Event (Signed)
Rapid Response Event Note  Overview: Respiratory - Follow Up  Initial Focused Assessment: I came by to see [name] at 1100 as follow up, [name] was quite tachypneic, RR in the mids 30s, + SOB and increased WOB. I see that CARDS MD just ordered Lasix 80 mg IV to be given but I am concerned overall that [name] might tire out. Lung - coarse crackles throughout, mild pursed lip breathing, [name] appears more fatigued today in comparison from yesterday evening. Skin warm and dry, VS stable, exception of RR 30-35.   Interventions: -- BIPAP -- 16/8 45%  Plan of Care: -- I paged the FMTS MD on call, updated her.  -- RN to page and update FMTS in hour with an update on the patient's respiratory status and overall condition -- BIPAP for a few hours and perhaps trial off later, for meals if patient tolerates. -- Encourage IS/CDB and OOB to chair. Promote ambulation  Event Summary:  Start Time 1100 End Time 1135  Philip Hurst R

## 2019-07-19 NOTE — Progress Notes (Signed)
Pt requesting to come off bipap.  Pt placed on 4l [place] and tolerating well.  No distress noted.  RT will continue to monitor.

## 2019-07-19 NOTE — Progress Notes (Addendum)
Orders received for cardiac cath. Groins and right wrist clipped per order. Pt on BiPAP, respiratory paged to assist with transfer. VS taken before transport. I accompanied pt down and gave report to MD and stopped heparin per cath lab before transport.  BP (!) 143/97   Pulse 84   Temp 98 F (36.7 C) (Axillary)   Resp (!) 21   Ht 5\' 3"  (1.6 m)   Wt 81.7 kg   SpO2 100%   BMI 31.91 kg/m   Called spouse and updated contact info. Home phone # listed is a land line and cuts out. Spouse gave me cell number that is more reliable: [phone] and this is updated in the contact info on chart. Spouse grateful for call - glad to know when procedure was starting.   Pt had upper and lower dentures in on transfer to cath lab. His glasses as well as other belongings were in the room - now packed up in his bag, labelled with his label.

## 2019-07-19 NOTE — H&P (View-Only) (Signed)
Progress Note  Patient Name: Philip Hurst Date of Encounter: 07/19/2019  Primary Cardiologist: New  Subjective   Worsening SOB, chest pain this AM  Inpatient Medications    Scheduled Meds: . aspirin EC  81 mg Oral Daily  . atorvastatin  40 mg Oral Daily  . cefdinir  300 mg Oral Q12H  . gabapentin  100 mg Oral QHS  . lidocaine  1 patch Transdermal Once  . loratadine  10 mg Oral Daily  . metoprolol tartrate  12.5 mg Oral BID   Continuous Infusions: . heparin 1,350 Units/hr (07/18/19 2038)   PRN Meds: acetaminophen **OR** acetaminophen, HYDROcodone-acetaminophen, polyethylene glycol, senna   Vital Signs    Vitals:   07/19/19 0817 07/19/19 0858 07/19/19 0918 07/19/19 1017  BP: (!) 161/104 (!) 146/92 134/90 (!) 142/89  Pulse: 69 95 95 91  Resp: (!) 31 (!) 32 (!) 26 (!) 24  Temp:    98 F (36.7 C)  TempSrc:    Axillary  SpO2: 93% 95% 95% 96%  Weight:      Height:        Intake/Output Summary (Last 24 hours) at 07/19/2019 1033 Last data filed at 07/19/2019 0604 Gross per 24 hour  Intake 2858.46 ml  Output 2625 ml  Net 233.46 ml   Last 3 Weights 07/19/2019 07/18/2019 07/17/2019  Weight (lbs) 180 lb 1.9 oz 185 lb 6.5 oz 173 lb 4.5 oz  Weight (kg) 81.7 kg 84.1 kg 78.6 kg      Telemetry    SR - Personally Reviewed  ECG    n/a- Personally Reviewed  Physical Exam   GEN: No acute distress.   Neck: No JVD Cardiac: RRR, no murmurs, rubs, or gallops.  Respiratory: Clear to auscultation bilaterally. GI: Soft, nontender, non-distended  MS: No edema; No deformity. Neuro:  Nonfocal  Psych: Normal affect   Labs    High Sensitivity Troponin:   Recent Labs  Lab 07/17/19 0448 07/17/19 0631 07/18/19 1243 07/18/19 2325 07/19/19 0900  TROPONINIHS 1,179* 1,370* 3,396* 3,206* 2,175*      Chemistry Recent Labs  Lab 07/16/19 0909  07/17/19 0206 07/17/19 1552 07/18/19 0226 07/19/19 0410  NA 135   < > 142 143 145 142  K 4.0   < > 3.9 3.8 3.7 4.0  CL 104   <  > 117* 119* 119* 115*  CO2 14*   < > 14* 16* 17* 15*  GLUCOSE 103*   < > 86 110* 125* 148*  BUN 93*   < > 59* 45* 37* 38*  CREATININE 4.89*   < > 2.61* 1.98* 1.65* 1.76*  CALCIUM 7.5*   < > 7.7* 7.9* 8.1* 8.2*  PROT 5.7*  --   --   --   --   --   ALBUMIN 3.1*  --  2.3*  --  2.3*  --   AST 25  --   --   --   --   --   ALT 22  --   --   --   --   --   ALKPHOS 92  --   --   --   --   --   BILITOT 0.1*  --   --   --   --   --   GFRNONAA 11*   < > 23* 31* 39* 36*  GFRAA 12*   < > 26* 36* 45* 42*  ANIONGAP 17*   < > 11 8 9 12    < > =  values in this interval not displayed.     Hematology Recent Labs  Lab 07/17/19 0206 07/18/19 0226 07/19/19 0410  WBC 11.9* 12.4* 15.1*  RBC 2.89* 2.93* 3.24*  HGB 9.1* 9.1* 10.4*  HCT 27.1* 27.3* 29.6*  MCV 93.8 93.2 91.4  MCH 31.5 31.1 32.1  MCHC 33.6 33.3 35.1  RDW 14.2 14.3 14.5  PLT 133* 150 197    BNPNo results for input(s): BNP, PROBNP in the last 168 hours.   DDimer  Recent Labs  Lab 07/16/19 0928  DDIMER 1.38*     Radiology    Ct Angio Chest Pe W Or Wo Contrast  Result Date: 07/18/2019 CLINICAL DATA:  Cough and pneumonia. Elevated D-dimer. History of DVT. EXAM: CT ANGIOGRAPHY CHEST WITH CONTRAST TECHNIQUE: Multidetector CT imaging of the chest was performed using the standard protocol during bolus administration of intravenous contrast. Multiplanar CT image reconstructions and MIPs were obtained to evaluate the vascular anatomy. CONTRAST:  19mL OMNIPAQUE IOHEXOL 350 MG/ML SOLN COMPARISON:  Chest x-ray July 16, 2019 FINDINGS: Cardiovascular: Atherosclerotic changes are seen in the nonaneurysmal aorta. Coronary artery calcifications involve the right and left coronary arteries. The heart size borderline to mildly enlarged. No pulmonary emboli. Mediastinum/Nodes: The thyroid and esophagus are normal. Several mildly enlarged mediastinal nodes are identified. A right paratracheal node measures 1.6 cm on series 5, image 57. A 1.7 cm node  is seen to the right of the esophagus on series 5, image 25. There is also some increased attenuation the mediastinal fat such as in the AP window on axial image 53. Lungs/Pleura: Central airways are unremarkable. No pneumothorax identified. Bilateral pulmonary infiltrates are multifocal, most prominent in the right upper lower lobes. There is also involvement of the right middle lobe, left upper lobe, and left lower lobe. Mild interlobular septal thickening. No pulmonary nodules or masses. Upper Abdomen: A small amount of ascites is seen adjacent to the liver on series 5, image 125. The upper abdomen is otherwise unremarkable. Musculoskeletal: No chest wall abnormality. No acute or significant osseous findings. Review of the MIP images confirms the above findings. IMPRESSION: 1. Multifocal infiltrates in the lungs are most consistent with pneumonia. 2. Interlobular septal thickening and pleural effusions suggest superimposed mild edema. 3. Shotty and mildly enlarged nodes in the mediastinum are likely reactive. Recommend attention on follow-up. 4. Increased attenuation scattered the mediastinal fat occluding in the AP window is nonspecific but could be due to volume overload. Recommend attention on follow-up. 5. Minimal ascites in the upper abdomen is nonspecific. Electronically Signed   By: Dorise Bullion III M.D   On: 07/18/2019 14:59   Dg Chest Port 1 View  Result Date: 07/19/2019 CLINICAL DATA:  Shortness of breath. EXAM: PORTABLE CHEST 1 VIEW COMPARISON:  Radiograph of July 18, 2019. FINDINGS: Stable cardiomediastinal silhouette. No pneumothorax or pleural effusion is noted. Stable bilateral lung opacities are noted concerning for edema or possibly pneumonia. Bony thorax unremarkable. IMPRESSION: Stable bilateral lung opacities as described above. Electronically Signed   By: Marijo Conception M.D.   On: 07/19/2019 09:52   Dg Chest Port 1 View  Result Date: 07/18/2019 CLINICAL DATA:  Pneumonia.   Shortness of breath and volume overload. EXAM: PORTABLE CHEST 1 VIEW COMPARISON:  07/16/2019 FINDINGS: Mild cardiomegaly. Bilateral lower lobe and right upper lobe airspace opacities are identified. Mild diffuse superimposed pulmonary edema. Osseous structures appear intact. IMPRESSION: 1. Worsening bilateral airspace opacities with new superimposed pulmonary edema. Electronically Signed   By: Queen Slough.D.  On: 07/18/2019 19:02    Cardiac Studies    Patient Profile     Philip Hurst is a 78 y.o. male with a hx of medically managed CAD, hypertension, hyperlipidemia, peripheral arterial disease and remote tobacco smoking who is being seen today for the evaluation of elevated troponin   Assessment & Plan    1.CAD - from notes history of remote cath with occluded RCA - elevated troponin on admission in setting of pneumonia - some SOB but no chest pain prior to admission - EKG without acute ischemic changes initially, today some lateral TWIs and ST elevation anterio and lateral precordial leads -hs trop up to 3396 yesterday peak, trending down - 07/17/19 echo LVEF 55-60%, normal RV function  From initial consult thought possible demand ischemia, thought not to be a cath candidate and no plans for inpatient ischemic testing at that time. If mental status and renal function improved could consider noninvasive testing.    - Cardiology reconsulted last night about changes in EKG last night, rising troponins. Worsenign SOB but no chest pain at that time - CT PE multifocal infiltrates consistent with pneumonia CXR worsening opacities with pulm edema - fellow discussed case with Dr Scarlette Calico who had done initial consult but also was on interventional call last night, recs for continued medical therapy including diuresis   - spoke with patient's wife. It is just the 2 of them at home, he is fairly independent. At basline he is fully alert and oriented. Independent in ADLs but limited due to  chronic back pains and his PAD.  - discussed with interventional Dr Tamala Julian, he will evaluate case and help decide if cath candidate today.  - stat echo ordered and echo tech called. Wrote for additional 80mg  of IV lasix.  - pending echo and decision on cath may need transfer to stepdown unit.    2. Acute heart failure - significant dyspnea yesterday and this AM - CXR with signs of pulm edema - given IV lasix 40mg  x 1 yesterday - stat echo ordered which I will view while being done.   3. AMS - admitted with AMS and sepsis with pneumonia - resolved, patient A&O x 3 today.   3. AKI - renal function improving.   4. PAD   Addendum Working SOB, placed on bipap. Looking at echo images apex is hypokinteic, LVEF roughly 25-30%, acutely lower from 9/25 study when LVEF 55-60%. Have recommended transfer to stepdown in talking with primary team, will have Dr Tamala Julian look over case and see if he is a candidate for cath lab.   For questions or updates, please contact [place] Please consult [email or web address] for contact info under        Signed, Carlyle Dolly, MD  07/19/2019, 10:33 AM

## 2019-07-19 NOTE — Progress Notes (Signed)
Pt continues to have lower back pain - reports chest pain is "about the same", rates 6/10 (same as after one time NTG administration). VS as below. Troponin back - MD [facility] paged. Will continue to monitor.  BP (!) 142/89 (BP Location: Left Arm)   Pulse 91   Temp 98 F (36.7 C) (Axillary)   Resp (!) 24   Ht 5\' 3"  (1.6 m)   Wt 81.7 kg   SpO2 96%   BMI 31.91 kg/m

## 2019-07-19 NOTE — Progress Notes (Signed)
[place] for Heparin  Indication: Rule out ACS  Allergies  Allergen Reactions  . Plavix [Clopidogrel Bisulfate] Itching and Rash    Patient Measurements: Height: 5\' 3"  (160 cm) Weight: 180 lb 1.9 oz (81.7 kg) IBW/kg (Calculated) : 56.9 Heparin Dosing Weight: 73.9kg  Vital Signs: Temp: 98.5 F (36.9 C) (09/27 0427) Temp Source: Axillary (09/27 0018) BP: 145/101 (09/27 0427) Pulse Rate: 87 (09/27 0427)  Labs: Recent Labs    07/17/19 0206  07/17/19 0631  07/17/19 1552 07/17/19 2125 07/18/19 0226 07/18/19 1243 07/18/19 2325 07/19/19 0410  HGB 9.1*  --   --   --   --   --  9.1*  --   --  10.4*  HCT 27.1*  --   --   --   --   --  27.3*  --   --  29.6*  PLT 133*  --   --   --   --   --  150  --   --  197  HEPARINUNFRC  --   --   --    < > 0.43 0.35 0.24*  --   --  0.32  CREATININE 2.61*  --   --   --  1.98*  --  1.65*  --   --   --   TROPONINIHS 997*   < > 1,370*  --   --   --   --  3,396* 3,206*  --    < > = values in this interval not displayed.    Estimated Creatinine Clearance: 34.9 mL/min (A) (by C-G formula based on SCr of 1.65 mg/dL (H)).   Assessment: 39 YOM presenting with sepsis, now with concern for PE vs ACS. Pharmacy consulted to manage IV heparin.  Heparin was stopped earlier today with thoughts that this was demand ischemia. Pt now with worsening respiratory status so pharmacy asked to resume.  9/27 AM update:  Heparin level therapeutic x 1 after re-start CT angio negative for PE Rule out ACS  Goal of Therapy:  Heparin level 0.3-0.7 units/ml Monitor platelets by anticoagulation protocol: Yes   Plan:  Cont heparin drip at 1350 units/hr Confirmatory heparin level at [place], PharmD, [place] Pharmacist Phone: [phone]

## 2019-07-19 NOTE — Progress Notes (Signed)
*  PRELIMINARY RESULTS* Echocardiogram 2D Echocardiogram has been performed.  Philip Hurst 07/19/2019, 12:03 PM

## 2019-07-19 NOTE — Progress Notes (Signed)
RT NOTES: Transported patient to Cath Lab and then to room 2H03 without incident.

## 2019-07-19 NOTE — Interval H&P Note (Signed)
  Cath Lab Visit (complete for each Cath Lab visit)  Clinical Evaluation Leading to the Procedure:   ACS: Yes.    Non-ACS:    Anginal Classification: CCS III  Anti-ischemic medical therapy: No Therapy  Non-Invasive Test Results: No non-invasive testing performed  Prior CABG: No previous CABG      History and Physical Interval Note:  07/19/2019 12:51 PM  [name]  has presented today for surgery, with the diagnosis of Urgent Cath.  The various methods of treatment have been discussed with the patient and family. After consideration of risks, benefits and other options for treatment, the patient has consented to  Procedure(s): LEFT HEART CATH AND CORONARY ANGIOGRAPHY (N/A) as a surgical intervention.  The patient's history has been reviewed, patient examined, no change in status, stable for surgery.  I have reviewed the patient's chart and labs.  Questions were answered to the patient's satisfaction.     Belva Crome III

## 2019-07-19 NOTE — Progress Notes (Signed)
PT Cancellation Note  Patient Details Name: BACH DEEMS MRN: [id number] DOB: 1940-12-22   Cancelled Treatment:    Reason Eval/Treat Not Completed: Medical issues which prohibited therapy  MD at bedside. Patient having chest pain and noted recent abnormal EKG. Will defer PT evaluation at this time. Will monitor for appropriateness to participate with PT    Barry Brunner, PT      Jeanie Cooks Abigail Teall 07/19/2019, 8:54 AM

## 2019-07-19 NOTE — Progress Notes (Addendum)
[place] for heparin Indication: ACS/STEMI  Allergies  Allergen Reactions  . Plavix [Clopidogrel Bisulfate] Itching and Rash    Patient Measurements: Height: 5\' 3"  (160 cm) Weight: 180 lb 1.9 oz (81.7 kg) IBW/kg (Calculated) : 56.9 Heparin Dosing Weight: 73.9kg  Vital Signs: Temp: 98 F (36.7 C) (09/27 1017) Temp Source: Axillary (09/27 1017) BP: 143/97 (09/27 1300) Pulse Rate: 84 (09/27 1300)  Labs: Recent Labs    07/17/19 0206  07/17/19 1552  07/18/19 0226 07/18/19 1243 07/18/19 2325 07/19/19 0410 07/19/19 0900 07/19/19 1155  HGB 9.1*  --   --   --  9.1*  --   --  10.4*  --   --   HCT 27.1*  --   --   --  27.3*  --   --  29.6*  --   --   PLT 133*  --   --   --  150  --   --  197  --   --   HEPARINUNFRC  --   --  0.43   < > 0.24*  --   --  0.32  --  0.52  CREATININE 2.61*  --  1.98*  --  1.65*  --   --  1.76*  --   --   TROPONINIHS 997*   < >  --   --   --  [id number]* 3,206*  --  2,175*  --    < > = values in this interval not displayed.    Estimated Creatinine Clearance: 32.7 mL/min (A) (by C-G formula based on SCr of 1.76 mg/dL (H)).   Assessment: 78 YOM presenting with sepsis, now with concern for PE vs ACS.  Patient was taken to an urgent LHC this morning due to EKG changes overnight and rising troponins. Found to have total occlusion of RCA with collaterals from left circ. LAD 50-90%. TCTS consulted for possible CABG.  Pharmacy consulted to resume IV heparin 4 hours post sheath removal (documented at 1350)  Prior heparin rate at 1350 with therapeutic heparin levels.  Goal of Therapy:  Heparin level 0.3-0.7 units/ml Monitor platelets by anticoagulation protocol: Yes   Plan:  Restart heparin 1350 units/hr at 1800 Check next heparin level with AM labs Daily heparin level, CBC Monitor for bleeding, follow surgical plans  Vertis Kelch, PharmD PGY2 Cardiology Pharmacy Resident Phone [phone] 07/19/2019        2:24 PM  Please check [email or web address] for unit-specific pharmacist phone numbers

## 2019-07-19 NOTE — Consult Note (Signed)
[address]       [place] [address]             [place] Record [id number] Date of Birth: 01/19/1941  Referring: No ref. provider found Primary Care: Leonard Downing, MD Primary Cardiologist:No primary care provider on file.  Chief Complaint:    Chief Complaint  Patient presents with  . Seizures    History of Present Illness:     78 year old male admitted to the medicine service with altered mental status and sepsis.  He was noted to have pneumonia that was noted on CT scan.  During his hospitalization he had worsening shortness of breath and chest pain, thus cardiology was consulted.  He underwent a left heart cath which showed three-vessel coronary disease.  CTS has been consulted to assist with management.   Past Medical and Surgical History: Previous Chest Surgery: None Previous Chest Radiation: None Diabetes Mellitus: None.  HbA1C pending Creatinine: 1.76  Past Medical History:  Diagnosis Date  . Arthritis    "back, joints in right hand" (08/18/2013)  . CAD (coronary artery disease), single vessel disease with collaterals, medical therapy 09/12/2011  . Chronic lower back pain   . Claudication of left lower extremity, ABI Lt 0.52 -lifestyle limiting  ABI rt 0.90 01/30/2013  . Coronary artery disease    OCCLUDED RCA  . Hyperlipidemia   . Hypertension   . Normal cardiac stress test, No ischemia 12/15/12 01/30/2013  . PAD (peripheral artery disease) ([place])     Past Surgical History:  Procedure Laterality Date  . BACK SURGERY    . CARDIAC CATHETERIZATION  @2002    1 vessel disease w/collaterals,medical therapy  . CARDIOVASCULAR STRESS TEST  12/07/2008   EF 58%, NO ISCHEMIA  . CATARACT EXTRACTION W/ INTRAOCULAR LENS  IMPLANT, BILATERAL Bilateral   . 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 lower extremities arterial doppler  02/16/2013   bilateral ABIs normal values w/o  evidence arterial insuff. at rest; left  lower extrem. norm. left popliteal artery stent norm. patency  . LOWER EXTREMITY ANGIOGRAM Left 08/18/2013   Procedure: LOWER EXTREMITY ANGIOGRAM;  Surgeon: Lorretta Harp, MD;  Location: [facility] CATH LAB;  Service: Cardiovascular;  Laterality: Left;  . LUMBAR DISC SURGERY  1990's   "herniated disc" (08/18/2013)  . PERCUTANEOUS STENT INTERVENTION  01/30/2013   Procedure: PERCUTANEOUS STENT INTERVENTION;  Surgeon: Lorretta Harp, MD;  Location: [facility] CATH LAB;  Service: Cardiovascular;;  . PERIPHERAL VASCULAR CATHETERIZATION N/A 09/05/2015   Procedure: Lower Extremity Angiography;  Surgeon: Lorretta Harp, MD;  Location: [facility] CV LAB;  Service: Cardiovascular;  Laterality: N/A;  . POPLITEAL ARTERY STENT Left 01/30/13  . POPLITEAL ARTERY STENT Left 10/'28/2014   successful Viabahn covered stent placement within the previously placed IDEV  stents/notes 08/18/2013  . POSTERIOR LUMBAR FUSION  1990's  . TONSILLECTOMY    . WRIST FUSION Right 1990's?   "first OR didn't correct problem" (08/18/2013)    Social History: Support: Lives with wife  Social History   Tobacco Use  Smoking Status Former Smoker  . Packs/day: 1.00  . Years: 44.00  . Pack years: 44.00  . Types: Cigarettes  . Quit date: 08/26/1997  . Years since quitting: 21.9  Smokeless Tobacco Current User  . Types: Chew    Social History   Substance and Sexual Activity  Alcohol Use Yes  Comment: 08/18/2013 "glass of wine 5-6 months ago; very rare"     Allergies  Allergen Reactions  . Plavix [Clopidogrel Bisulfate] Itching and Rash      Current Facility-Administered Medications  Medication Dose Route Frequency Provider Last Rate Last Dose  . [START ON 07/20/2019] 0.9 %  sodium chloride infusion  250 mL Intravenous PRN Belva Crome, MD      . 0.9% sodium chloride infusion  1 mL/kg/hr Intravenous Continuous Belva Crome, MD 81.7 mL/hr at 07/19/19 1444 1 mL/kg/hr at 07/19/19  1444  . acetaminophen (TYLENOL) tablet 650 mg  650 mg Oral Q4H PRN Belva Crome, MD      . aspirin EC tablet 81 mg  81 mg Oral Daily Belva Crome, MD   81 mg at 07/19/19 [id number]  . [START ON 07/20/2019] atorvastatin (LIPITOR) tablet 80 mg  80 mg Oral q1800 Belva Crome, MD      . cefdinir (OMNICEF) capsule 300 mg  300 mg Oral Q12H Belva Crome, MD   300 mg at 07/19/19 1038  . Chlorhexidine Gluconate Cloth 2 % PADS 6 each  6 each Topical Daily Belva Crome, MD   6 each at 07/19/19 1430  . gabapentin (NEURONTIN) capsule 100 mg  100 mg Oral QHS Belva Crome, MD   100 mg at 07/19/19 2125  . heparin ADULT infusion 100 units/mL (25000 units/243mL sodium chloride 0.45%)  1,350 Units/hr Intravenous Continuous Ronna Polio, RPH 13.5 mL/hr at 07/19/19 2000 1,350 Units/hr at 07/19/19 2000  . HYDROcodone-acetaminophen (NORCO) 10-325 MG per tablet 1 tablet  1 tablet Oral Q4H PRN Belva Crome, MD   1 tablet at 07/19/19 1037  . loratadine (CLARITIN) tablet 10 mg  10 mg Oral Daily Belva Crome, MD   10 mg at 07/19/19 1039  . metoprolol tartrate (LOPRESSOR) tablet 12.5 mg  12.5 mg Oral BID Belva Crome, MD   12.5 mg at 07/19/19 2125  . nitroGLYCERIN 50 mg in dextrose 5 % 250 mL (0.2 mg/mL) infusion  20-100 mcg/min Intravenous Titrated Belva Crome, MD 3 mL/hr at 07/19/19 2000 10 mcg/min at 07/19/19 2000  . ondansetron (ZOFRAN) injection 4 mg  4 mg Intravenous Q6H PRN Belva Crome, MD      . polyethylene glycol [facility]) packet 17 g  17 g Oral Daily PRN Belva Crome, MD      . senna [facility]) tablet 8.6 mg  1 tablet Oral Daily PRN Belva Crome, MD      . sodium chloride flush (NS) 0.9 % injection 3 mL  3 mL Intravenous Q12H Belva Crome, MD   3 mL at 07/19/19 2125  . [START ON 07/20/2019] sodium chloride flush (NS) 0.9 % injection 3 mL  3 mL Intravenous Q12H Belva Crome, MD      . Derrill Memo ON 07/20/2019] sodium chloride flush (NS) 0.9 % injection 3 mL  3 mL Intravenous PRN  Belva Crome, MD        Medications Prior to Admission  Medication Sig Dispense Refill Last Dose  . aspirin EC 81 MG EC tablet Take 1 tablet (81 mg total) by mouth daily. 30 tablet 0   . atorvastatin (LIPITOR) 10 MG tablet Take 10 mg by mouth daily.  0   . HYDROcodone-acetaminophen (NORCO) 10-325 MG per tablet Take 1 tablet by mouth every 4 (four) hours as needed for pain.      Marland Kitchen ketoconazole (NIZORAL) 2 %  shampoo Apply 1 application topically 2 (two) times a week.     . losartan (COZAAR) 100 MG tablet Take 100 mg by mouth daily.       Family History  Problem Relation Age of Onset  . Seizures Mother   . Cancer Father   . Coronary artery disease Brother   . Diabetes Brother      Review of Systems:   Review of Systems  Constitutional: Positive for chills, fever and malaise/fatigue.  HENT: Negative.   Eyes: Negative.   Respiratory: Positive for cough, sputum production and shortness of breath.   Cardiovascular: Positive for chest pain.  Gastrointestinal: Negative.   Musculoskeletal: Negative.   Neurological: Negative.       Physical Exam: BP 130/72   Pulse 95   Temp 98.3 F (36.8 C) (Oral)   Resp (!) 25   Ht 5\' 3"  (1.6 m)   Wt 79.6 kg   SpO2 (!) 87%   BMI 31.09 kg/m  Physical Exam  Constitutional: He is oriented to person, place, and time.  Frail, deconditioned  HENT:  Head: Normocephalic and atraumatic.  Eyes: Conjunctivae are normal. No scleral icterus.  Neck: Normal range of motion. No tracheal deviation present.  Cardiovascular: Normal rate and regular rhythm.  No murmur heard. Occasional PVCs  Pulmonary/Chest: Effort normal and breath sounds normal. No respiratory distress.  Abdominal: Soft. He exhibits no distension.  Neurological: He is alert and oriented to person, place, and time.      Diagnostic Studies & Laboratory data:       I have independently reviewed the above radiologic studies and discussed with the patient   Recent Lab  Findings: Lab Results  Component Value Date   WBC 15.1 (H) 07/19/2019   HGB 10.4 (L) 07/19/2019   HCT 29.6 (L) 07/19/2019   PLT 197 07/19/2019   GLUCOSE 148 (H) 07/19/2019   ALT 22 07/16/2019   AST 25 07/16/2019   NA 142 07/19/2019   K 4.0 07/19/2019   CL 115 (H) 07/19/2019   CREATININE 1.76 (H) 07/19/2019   BUN 38 (H) 07/19/2019   CO2 15 (L) 07/19/2019   TSH 1.393 08/29/2015   INR 1.02 08/29/2015      Assessment / Plan:   78 year old male currently being treated for pneumonia and sepsis who was noted to have three-vessel coronary disease.  Most recent echo shows that his EF is down to 25%.  There is no significant valvular disease.  Targets are acceptable for surgical bypass.  Given his current clinical status he will need medical optimization and improvement from his pneumonia prior to proceeding with surgery.  We will continue to follow along.     I  spent 30 minutes counseling the patient face to face.   Lajuana Matte 07/19/2019 9:42 PM

## 2019-07-19 NOTE — Progress Notes (Signed)
RT NOTES: Placed patient on bipap per orders. Pt's breathing labored with rate of 30. 16/8, 45% fio2. Pt tolerating well, and work of breathing is decreasing. Will continue to monitor.

## 2019-07-19 NOTE — Progress Notes (Addendum)
Progress Note  Patient Name: Philip Hurst Date of Encounter: 07/19/2019  Primary Cardiologist: New  Subjective   Worsening SOB, chest pain this AM  Inpatient Medications    Scheduled Meds: . aspirin EC  81 mg Oral Daily  . atorvastatin  40 mg Oral Daily  . cefdinir  300 mg Oral Q12H  . gabapentin  100 mg Oral QHS  . lidocaine  1 patch Transdermal Once  . loratadine  10 mg Oral Daily  . metoprolol tartrate  12.5 mg Oral BID   Continuous Infusions: . heparin 1,350 Units/hr (07/18/19 2038)   PRN Meds: acetaminophen **OR** acetaminophen, HYDROcodone-acetaminophen, polyethylene glycol, senna   Vital Signs    Vitals:   07/19/19 0817 07/19/19 0858 07/19/19 0918 07/19/19 1017  BP: (!) 161/104 (!) 146/92 134/90 (!) 142/89  Pulse: 69 95 95 91  Resp: (!) 31 (!) 32 (!) 26 (!) 24  Temp:    98 F (36.7 C)  TempSrc:    Axillary  SpO2: 93% 95% 95% 96%  Weight:      Height:        Intake/Output Summary (Last 24 hours) at 07/19/2019 1033 Last data filed at 07/19/2019 0604 Gross per 24 hour  Intake 2858.46 ml  Output 2625 ml  Net 233.46 ml   Last 3 Weights 07/19/2019 07/18/2019 07/17/2019  Weight (lbs) 180 lb 1.9 oz 185 lb 6.5 oz 173 lb 4.5 oz  Weight (kg) 81.7 kg 84.1 kg 78.6 kg      Telemetry    SR - Personally Reviewed  ECG    n/a- Personally Reviewed  Physical Exam   GEN: No acute distress.   Neck: No JVD Cardiac: RRR, no murmurs, rubs, or gallops.  Respiratory: Clear to auscultation bilaterally. GI: Soft, nontender, non-distended  MS: No edema; No deformity. Neuro:  Nonfocal  Psych: Normal affect   Labs    High Sensitivity Troponin:   Recent Labs  Lab 07/17/19 0448 07/17/19 0631 07/18/19 1243 07/18/19 2325 07/19/19 0900  TROPONINIHS 1,179* 1,370* 3,396* 3,206* 2,175*      Chemistry Recent Labs  Lab 07/16/19 0909  07/17/19 0206 07/17/19 1552 07/18/19 0226 07/19/19 0410  NA 135   < > 142 143 145 142  K 4.0   < > 3.9 3.8 3.7 4.0  CL 104   <  > 117* 119* 119* 115*  CO2 14*   < > 14* 16* 17* 15*  GLUCOSE 103*   < > 86 110* 125* 148*  BUN 93*   < > 59* 45* 37* 38*  CREATININE 4.89*   < > 2.61* 1.98* 1.65* 1.76*  CALCIUM 7.5*   < > 7.7* 7.9* 8.1* 8.2*  PROT 5.7*  --   --   --   --   --   ALBUMIN 3.1*  --  2.3*  --  2.3*  --   AST 25  --   --   --   --   --   ALT 22  --   --   --   --   --   ALKPHOS 92  --   --   --   --   --   BILITOT 0.1*  --   --   --   --   --   GFRNONAA 11*   < > 23* 31* 39* 36*  GFRAA 12*   < > 26* 36* 45* 42*  ANIONGAP 17*   < > 11 8 9 12    < > =  values in this interval not displayed.     Hematology Recent Labs  Lab 07/17/19 0206 07/18/19 0226 07/19/19 0410  WBC 11.9* 12.4* 15.1*  RBC 2.89* 2.93* 3.24*  HGB 9.1* 9.1* 10.4*  HCT 27.1* 27.3* 29.6*  MCV 93.8 93.2 91.4  MCH 31.5 31.1 32.1  MCHC 33.6 33.3 35.1  RDW 14.2 14.3 14.5  PLT 133* 150 197    BNPNo results for input(s): BNP, PROBNP in the last 168 hours.   DDimer  Recent Labs  Lab 07/16/19 0928  DDIMER 1.38*     Radiology    Ct Angio Chest Pe W Or Wo Contrast  Result Date: 07/18/2019 CLINICAL DATA:  Cough and pneumonia. Elevated D-dimer. History of DVT. EXAM: CT ANGIOGRAPHY CHEST WITH CONTRAST TECHNIQUE: Multidetector CT imaging of the chest was performed using the standard protocol during bolus administration of intravenous contrast. Multiplanar CT image reconstructions and MIPs were obtained to evaluate the vascular anatomy. CONTRAST:  9mL OMNIPAQUE IOHEXOL 350 MG/ML SOLN COMPARISON:  Chest x-ray July 16, 2019 FINDINGS: Cardiovascular: Atherosclerotic changes are seen in the nonaneurysmal aorta. Coronary artery calcifications involve the right and left coronary arteries. The heart size borderline to mildly enlarged. No pulmonary emboli. Mediastinum/Nodes: The thyroid and esophagus are normal. Several mildly enlarged mediastinal nodes are identified. A right paratracheal node measures 1.6 cm on series 5, image 57. A 1.7 cm node  is seen to the right of the esophagus on series 5, image 25. There is also some increased attenuation the mediastinal fat such as in the AP window on axial image 53. Lungs/Pleura: Central airways are unremarkable. No pneumothorax identified. Bilateral pulmonary infiltrates are multifocal, most prominent in the right upper lower lobes. There is also involvement of the right middle lobe, left upper lobe, and left lower lobe. Mild interlobular septal thickening. No pulmonary nodules or masses. Upper Abdomen: A small amount of ascites is seen adjacent to the liver on series 5, image 125. The upper abdomen is otherwise unremarkable. Musculoskeletal: No chest wall abnormality. No acute or significant osseous findings. Review of the MIP images confirms the above findings. IMPRESSION: 1. Multifocal infiltrates in the lungs are most consistent with pneumonia. 2. Interlobular septal thickening and pleural effusions suggest superimposed mild edema. 3. Shotty and mildly enlarged nodes in the mediastinum are likely reactive. Recommend attention on follow-up. 4. Increased attenuation scattered the mediastinal fat occluding in the AP window is nonspecific but could be due to volume overload. Recommend attention on follow-up. 5. Minimal ascites in the upper abdomen is nonspecific. Electronically Signed   By: Dorise Bullion III M.D   On: 07/18/2019 14:59   Dg Chest Port 1 View  Result Date: 07/19/2019 CLINICAL DATA:  Shortness of breath. EXAM: PORTABLE CHEST 1 VIEW COMPARISON:  Radiograph of July 18, 2019. FINDINGS: Stable cardiomediastinal silhouette. No pneumothorax or pleural effusion is noted. Stable bilateral lung opacities are noted concerning for edema or possibly pneumonia. Bony thorax unremarkable. IMPRESSION: Stable bilateral lung opacities as described above. Electronically Signed   By: Marijo Conception M.D.   On: 07/19/2019 09:52   Dg Chest Port 1 View  Result Date: 07/18/2019 CLINICAL DATA:  Pneumonia.   Shortness of breath and volume overload. EXAM: PORTABLE CHEST 1 VIEW COMPARISON:  07/16/2019 FINDINGS: Mild cardiomegaly. Bilateral lower lobe and right upper lobe airspace opacities are identified. Mild diffuse superimposed pulmonary edema. Osseous structures appear intact. IMPRESSION: 1. Worsening bilateral airspace opacities with new superimposed pulmonary edema. Electronically Signed   By: Queen Slough.D.  On: 07/18/2019 19:02    Cardiac Studies    Patient Profile     CORDERIO HALFACRE is a 78 y.o. male with a hx of medically managed CAD, hypertension, hyperlipidemia, peripheral arterial disease and remote tobacco smoking who is being seen today for the evaluation of elevated troponin   Assessment & Plan    1.CAD - from notes history of remote cath with occluded RCA - elevated troponin on admission in setting of pneumonia - some SOB but no chest pain prior to admission - EKG without acute ischemic changes initially, today some lateral TWIs and ST elevation anterio and lateral precordial leads -hs trop up to 3396 yesterday peak, trending down - 07/17/19 echo LVEF 55-60%, normal RV function  From initial consult thought possible demand ischemia, thought not to be a cath candidate and no plans for inpatient ischemic testing at that time. If mental status and renal function improved could consider noninvasive testing.    - Cardiology reconsulted last night about changes in EKG last night, rising troponins. Worsenign SOB but no chest pain at that time - CT PE multifocal infiltrates consistent with pneumonia CXR worsening opacities with pulm edema - fellow discussed case with Dr Scarlette Calico who had done initial consult but also was on interventional call last night, recs for continued medical therapy including diuresis   - spoke with patient's wife. It is just the 2 of them at home, he is fairly independent. At basline he is fully alert and oriented. Independent in ADLs but limited due to  chronic back pains and his PAD.  - discussed with interventional Dr Tamala Julian, he will evaluate case and help decide if cath candidate today.  - stat echo ordered and echo tech called. Wrote for additional 80mg  of IV lasix.  - pending echo and decision on cath may need transfer to stepdown unit.    2. Acute heart failure - significant dyspnea yesterday and this AM - CXR with signs of pulm edema - given IV lasix 40mg  x 1 yesterday - stat echo ordered which I will view while being done.   3. AMS - admitted with AMS and sepsis with pneumonia - resolved, patient A&O x 3 today.   3. AKI - renal function improving.   4. PAD   Addendum Working SOB, placed on bipap. Looking at echo images apex is hypokinteic, LVEF roughly 25-30%, acutely lower from 9/25 study when LVEF 55-60%. Have recommended transfer to stepdown in talking with primary team, will have Dr Tamala Julian look over case and see if he is a candidate for cath lab.   For questions or updates, please contact [place] Please consult [email or web address] for contact info under        Signed, Carlyle Dolly, MD  07/19/2019, 10:33 AM

## 2019-07-19 NOTE — Progress Notes (Signed)
[place] for heparin Indication: pulmonary embolus vs ACS  Allergies  Allergen Reactions  . Plavix [Clopidogrel Bisulfate] Itching and Rash    Patient Measurements: Height: 5\' 3"  (160 cm) Weight: 180 lb 1.9 oz (81.7 kg) IBW/kg (Calculated) : 56.9 Heparin Dosing Weight: 73.9kg  Vital Signs: Temp: 98 F (36.7 C) (09/27 1017) Temp Source: Axillary (09/27 1017) BP: 142/89 (09/27 1134) Pulse Rate: 92 (09/27 1134)  Labs: Recent Labs    07/17/19 0206  07/17/19 1552 07/17/19 2125 07/18/19 0226 07/18/19 1243 07/18/19 2325 07/19/19 0410 07/19/19 0900  HGB 9.1*  --   --   --  9.1*  --   --  10.4*  --   HCT 27.1*  --   --   --  27.3*  --   --  29.6*  --   PLT 133*  --   --   --  150  --   --  197  --   HEPARINUNFRC  --    < > 0.43 0.35 0.24*  --   --  0.32  --   CREATININE 2.61*  --  1.98*  --  1.65*  --   --  1.76*  --   TROPONINIHS 997*   < >  --   --   --  [id number]* 3,206*  --  2,175*   < > = values in this interval not displayed.    Estimated Creatinine Clearance: 32.7 mL/min (A) (by C-G formula based on SCr of 1.76 mg/dL (H)).   Assessment: 45 YOM presenting with sepsis, now with concern for PE vs ACS. Pharmacy consulted to manage IV heparin. Heparin was stopped on 9/26 with thoughts that this was demand ischemia.Troponin increased to 3396 and now downtrending. Patient with worsening respiratory status so pharmacy asked to resume heparin.   Heparin level of 0.52 is therapeutic on heparin 1350 units/hr. Will continue current rate. CBC stable. No reported bleeding. Patient will have cardiac cath today.    Goal of Therapy:  Heparin level 0.3-0.7 units/ml Monitor platelets by anticoagulation protocol: Yes   Plan:  Continue heparin drip at 1350 units/hr Follow up plans after cardiac cath  Monitor heparin level, CBC, and S/S of bleeding daily  Cristela Felt, PharmD PGY1 Pharmacy Resident [employer]: [phone]  07/19/2019

## 2019-07-20 ENCOUNTER — Encounter (HOSPITAL_COMMUNITY): Payer: Self-pay | Admitting: Interventional Cardiology

## 2019-07-20 ENCOUNTER — Telehealth: Payer: Self-pay | Admitting: Interventional Cardiology

## 2019-07-20 ENCOUNTER — Other Ambulatory Visit: Payer: Self-pay | Admitting: *Deleted

## 2019-07-20 DIAGNOSIS — I214 Non-ST elevation (NSTEMI) myocardial infarction: Secondary | ICD-10-CM

## 2019-07-20 DIAGNOSIS — I2510 Atherosclerotic heart disease of native coronary artery without angina pectoris: Secondary | ICD-10-CM

## 2019-07-20 LAB — RENAL FUNCTION PANEL
Albumin: 2.3 g/dL — ABNORMAL LOW (ref 3.5–5.0)
Anion gap: 13 (ref 5–15)
BUN: 44 mg/dL — ABNORMAL HIGH (ref 8–23)
CO2: 18 mmol/L — ABNORMAL LOW (ref 22–32)
Calcium: 8.2 mg/dL — ABNORMAL LOW (ref 8.9–10.3)
Chloride: 109 mmol/L (ref 98–111)
Creatinine, Ser: 1.58 mg/dL — ABNORMAL HIGH (ref 0.61–1.24)
GFR calc Af Amer: 48 mL/min — ABNORMAL LOW (ref >60)
GFR calc non Af Amer: 41 mL/min — ABNORMAL LOW (ref >60)
Glucose, Bld: 143 mg/dL — ABNORMAL HIGH (ref 70–99)
Phosphorus: 3.8 mg/dL (ref 2.5–4.6)
Potassium: 3.5 mmol/L (ref 3.5–5.1)
Sodium: 140 mmol/L (ref 135–145)

## 2019-07-20 LAB — CBC
HCT: 28.9 % — ABNORMAL LOW (ref 39.0–52.0)
Hemoglobin: 9.9 g/dL — ABNORMAL LOW (ref 13.0–17.0)
MCH: 31 pg (ref 26.0–34.0)
MCHC: 34.3 g/dL (ref 30.0–36.0)
MCV: 90.6 fL (ref 80.0–100.0)
Platelets: 182 10*3/uL (ref 150–400)
RBC: 3.19 MIL/uL — ABNORMAL LOW (ref 4.22–5.81)
RDW: 14.4 % (ref 11.5–15.5)
WBC: 11.8 10*3/uL — ABNORMAL HIGH (ref 4.0–10.5)
nRBC: 0 % (ref 0.0–0.2)

## 2019-07-20 LAB — TROPONIN I (HIGH SENSITIVITY): Troponin I (High Sensitivity): 941 ng/L (ref <18)

## 2019-07-20 LAB — HEPARIN LEVEL (UNFRACTIONATED)
Heparin Unfractionated: 0.37 IU/mL (ref 0.30–0.70)
Heparin Unfractionated: 0.33 IU/mL (ref 0.30–0.70)

## 2019-07-20 MED ORDER — OXYCODONE HCL ER 10 MG PO T12A
10.0000 mg | EXTENDED_RELEASE_TABLET | Freq: Two times a day (BID) | ORAL | Status: DC
  Administered 2019-07-20 – 2019-07-21 (×4): 10 mg via ORAL
  Filled 2019-07-20 (×5): qty 1

## 2019-07-20 MED ORDER — AMIODARONE LOAD VIA INFUSION
150.0000 mg | Freq: Once | INTRAVENOUS | Status: AC
  Administered 2019-07-20: 11:00:00 150 mg via INTRAVENOUS
  Filled 2019-07-20: qty 83.34

## 2019-07-20 MED ORDER — AMIODARONE HCL IN DEXTROSE 360-4.14 MG/200ML-% IV SOLN
60.0000 mg/h | INTRAVENOUS | Status: AC
  Administered 2019-07-20 (×2): 60 mg/h via INTRAVENOUS
  Filled 2019-07-20 (×2): qty 200

## 2019-07-20 MED ORDER — CARVEDILOL 3.125 MG PO TABS
3.1250 mg | ORAL_TABLET | Freq: Two times a day (BID) | ORAL | Status: DC
  Administered 2019-07-20 – 2019-07-21 (×3): 3.125 mg via ORAL
  Filled 2019-07-20 (×3): qty 1

## 2019-07-20 MED ORDER — AMIODARONE HCL IN DEXTROSE 360-4.14 MG/200ML-% IV SOLN
30.0000 mg/h | INTRAVENOUS | Status: DC
  Administered 2019-07-20 – 2019-07-22 (×4): 30 mg/h via INTRAVENOUS
  Filled 2019-07-20 (×3): qty 200

## 2019-07-20 NOTE — Evaluation (Signed)
Physical Therapy Evaluation Patient Details Name: JAAMAL SANDRA MRN: [id number] DOB: 18-Nov-1940 Today's Date: 07/20/2019   History of Present Illness  78 year old male presenting with sepsis, now with concern for PE vs ACS. Patient was taken to an urgent LHC 9/27 due to EKG changes overnight and rising troponins. Found to have total occlusion of RCA with collaterals from left circ. LAD 50-90%. CABG planned for 07/22/19  Clinical Impression  Patient presents with decreased independence with mobility due to generalized weakness, decreased balance and decreased activity tolerance.  Has back pain as well and currently min a for in room ambulation.  Previously was independent.  Feel he will benefit from skilled PT in the acute setting due to current deficits and anticipated changes post CABG planned later this week.  PT to follow acutely.  Likely can d/c home with family support and HHPT.     Follow Up Recommendations Home health PT;Supervision for mobility/OOB    Equipment Recommendations  None recommended by PT    Recommendations for Other Services       Precautions / Restrictions Precautions Precautions: Fall      Mobility  Bed Mobility Overal bed mobility: Needs Assistance Bed Mobility: Rolling;Sidelying to Sit Rolling: Min guard Sidelying to sit: Min assist       General bed mobility comments: cues for technique, assist to lift trunk  Transfers Overall transfer level: Needs assistance Equipment used: Rolling walker (2 wheeled) Transfers: Sit to/from Stand Sit to Stand: Min assist         General transfer comment: to steady  Ambulation/Gait Ambulation/Gait assistance: Min Web designer (Feet): 30 Feet Assistive device: Rolling walker (2 wheeled) Gait Pattern/deviations: Step-through pattern;Decreased stride length     General Gait Details: slow pace, flexed posture with lines O2, etc. SpO2 on 3LO2 91% with ambulation, HR max 118  Stairs             Wheelchair Mobility    Modified Rankin (Stroke Patients Only)       Balance Overall balance assessment: Needs assistance   Sitting balance-Leahy Scale: Good       Standing balance-Leahy Scale: Poor Standing balance comment: right now reliant on UE support for balance                             Pertinent Vitals/Pain Pain Assessment: 0-10 Pain Score: 7  Pain Location: low back pain Pain Descriptors / Indicators: Aching;Discomfort;Grimacing Pain Intervention(s): Limited activity within patient's tolerance;Monitored during session;Patient requesting pain meds-RN notified;Heat applied    Home Living Family/patient expects to be discharged to:: Private residence Living Arrangements: Spouse/significant other Available Help at Discharge: Family Type of Home: House Home Access: Level entry   Entrance Stairs-Number of Steps: basically a level entry [place]: One [place]: Shower seat - built in;Grab bars - tub/shower;Bedside commode;Walker - 2 wheels;Cane - single point Additional Comments: equipment was his mother-im-laws; thinks he has BSC, not sure    Prior Function Level of Independence: Independent         Comments: drove and did chores, etc     Hand Dominance   Dominant Hand: Right    Extremity/Trunk Assessment   Upper Extremity Assessment Upper Extremity Assessment: Overall WFL for tasks assessed    Lower Extremity Assessment Lower Extremity Assessment: Generalized weakness    Cervical / Trunk Assessment Cervical / Trunk Assessment: Kyphotic  Communication   Communication: No difficulties  Cognition Arousal/Alertness: Awake/alert  Behavior During Therapy: WFL for tasks assessed/performed Overall Cognitive Status: Within Functional Limits for tasks assessed                                        General Comments General comments (skin integrity, edema, etc.): Initiated education on sternal  precautions    Exercises     Assessment/Plan    PT Assessment Patient needs continued PT services  PT Problem List Decreased strength;Decreased mobility;Decreased activity tolerance;Decreased balance;Decreased knowledge of use of DME;Pain;Decreased knowledge of precautions       PT Treatment Interventions DME instruction;Therapeutic activities;Patient/family education;Therapeutic exercise;Gait training;Balance training;Functional mobility training    PT Goals (Current goals can be found in the Care Plan section)  Acute Rehab PT Goals Patient Stated Goal: to return to independent PT Goal Formulation: With patient Time For Goal Achievement: 08/03/19 Potential to Achieve Goals: Good    Frequency Min 3X/week   Barriers to discharge        Co-evaluation               AM-PAC PT "6 Clicks" Mobility  Outcome Measure Help needed turning from your back to your side while in a flat bed without using bedrails?: A Little Help needed moving from lying on your back to sitting on the side of a flat bed without using bedrails?: A Little Help needed moving to and from a bed to a chair (including a wheelchair)?: A Little Help needed standing up from a chair using your arms (e.g., wheelchair or bedside chair)?: A Little Help needed to walk in hospital room?: A Little Help needed climbing 3-5 steps with a railing? : A Little 6 Click Score: 18    End of Session Equipment Utilized During Treatment: Oxygen Activity Tolerance: Patient limited by fatigue Patient left: in chair;with call bell/phone within reach;with nursing/sitter in room Nurse Communication: Mobility status PT Visit Diagnosis: Other abnormalities of gait and mobility (R26.89);Muscle weakness (generalized) (M62.81)    Time: [id number] PT Time Calculation (min) (ACUTE ONLY): 27 min   Charges:   PT Evaluation $PT Eval Moderate Complexity: 1 Mod PT Treatments $Gait Training: 8-22 mins        Magda Kiel, [place] Acute  Rehabilitation Services [phone] 07/20/2019   Reginia Naas 07/20/2019, 4:55 PM

## 2019-07-20 NOTE — Telephone Encounter (Signed)
  Wife states she is returning a call from this morning but she was not sure who called.

## 2019-07-20 NOTE — Progress Notes (Signed)
[place] for heparin Indication: ACS/STEMI  Allergies  Allergen Reactions  . Plavix [Clopidogrel Bisulfate] Itching and Rash    Patient Measurements: Height: 5\' 3"  (160 cm) Weight: 180 lb 12.4 oz (82 kg) IBW/kg (Calculated) : 56.9 Heparin Dosing Weight: 73.9kg  Vital Signs: Temp: 97.6 F (36.4 C) (09/28 1521) Temp Source: Oral (09/28 1521) BP: 116/61 (09/28 1500) Pulse Rate: 75 (09/28 1500)  Labs: Recent Labs    07/18/19 0226  07/18/19 2325 07/19/19 0410 07/19/19 0900 07/19/19 1155 07/20/19 0221 07/20/19 0838 07/20/19 0845 07/20/19 1442  HGB 9.1*  --   --  10.4*  --   --  9.9*  --   --   --   HCT 27.3*  --   --  29.6*  --   --  28.9*  --   --   --   PLT 150  --   --  197  --   --  182  --   --   --   HEPARINUNFRC 0.24*  --   --  0.32  --  0.52  --  0.37  --  0.33  CREATININE 1.65*  --   --  1.76*  --   --  1.58*  --   --   --   TROPONINIHS  --    < > 3,206*  --  2,175*  --   --   --  941*  --    < > = values in this interval not displayed.    Estimated Creatinine Clearance: 36.5 mL/min (A) (by C-G formula based on SCr of 1.58 mg/dL (H)).   Assessment: 78 year old male presenting with sepsis, now with concern for PE vs ACS. Patient was taken to an urgent LHC 9/27 due to EKG changes overnight and rising troponins. Found to have total occlusion of RCA with collaterals from left circ. LAD 50-90%. CABG planned for 07/22/19  Heparin level 0800 therapeutic at 0.35, repeat confirmatory level at 1400 0.33 at 1350 units/hour. Levels on low end of goal, will increase to keep in therapeutic range. No bleeding reported.   Goal of Therapy:  Heparin level 0.3-0.7 units/ml Monitor platelets by anticoagulation protocol: Yes   Plan:  Increase heparin drip to 1450 units/hour Daily heparin level, CBC     Thank you for allowing pharmacy to participate in this patient's care.  Kadia Abaya L. Devin Going, PharmD, [place] PGY1 Pharmacy  Resident 07/20/19      3:47 PM  Please check AMION for all [place] phone numbers After 10:00 PM, call the [place] [phone]

## 2019-07-20 NOTE — Telephone Encounter (Signed)
I am not sure who called the pt. Pt last seen by Dr. Gwenlyn Found 2016. I will route to NL Triage for further f/u. Pt is currently admitted.

## 2019-07-20 NOTE — Telephone Encounter (Signed)
Spoke to wife . She states  She redial the number from earlier today - no message left. RN informed wife that  documentation of call was not left in patient's chart for RN to review. Wife verbalized understanding.

## 2019-07-20 NOTE — TOC Initial Note (Addendum)
Transition of Care [facility]) - Initial/Assessment Note    Patient Details  Name: Philip Hurst MRN: [id number] Date of Birth: September 07, 1941  Transition of [facility]) CM/SW Contact:    Bethena Roys, RN Phone Number: 07/20/2019, 12:52 PM  Clinical Narrative:   Pt presented for confusion- no acute findings from CT Scan. Pt with elevated troponin on admission.  Post left heart cath-plan for CABG 07-22-19. PTA from home with support of spouse. CM will follow for transition of care needs.              Expected Discharge Plan: [place] Barriers to Discharge: Continued Medical Work up   Expected Discharge Plan and Services Expected Discharge Plan: [place] arrangements for the past 2 months: Single Family Home                   Prior Living Arrangements/Services Living arrangements for the past 2 months: Single Family Home Lives with:: Spouse          Need for Family Participation in Patient Care: Yes (Comment) Care giver support system in place?: Yes (comment)   Criminal Activity/Legal Involvement Pertinent to Current Situation/Hospitalization: No - Comment as needed  Activities of Daily Living Home Assistive Devices/Equipment: None ADL Screening (condition at time of admission) Patient's cognitive ability adequate to safely complete daily activities?: Yes Is the patient deaf or have difficulty hearing?: Yes Does the patient have difficulty seeing, even when wearing glasses/contacts?: No Does the patient have difficulty concentrating, remembering, or making decisions?: Yes Patient able to express need for assistance with ADLs?: Yes Does the patient have difficulty dressing or bathing?: No Independently performs ADLs?: Yes (appropriate for developmental age) Does the patient have difficulty walking or climbing stairs?: No Weakness of Legs: None Weakness of Arms/Hands: None   Emotional Assessment Appearance:: Appears  stated age     Orientation: : Oriented to Self, Oriented to Place, Oriented to  Time, Oriented to Situation Alcohol / Substance Use: Not Applicable Psych Involvement: No (comment)  Admission diagnosis:  AKI (acute kidney injury) ([place]) [N17.9] Community acquired pneumonia of right lung, unspecified part of lung [J18.9] Sepsis, due to unspecified organism, unspecified whether acute organ dysfunction present [facility]) [A41.9] Patient Active Problem List   Diagnosis Date Noted  . Acute systolic HF (heart failure) ([place]) 07/19/2019  . Pneumonia 07/16/2019  . Sepsis ([place])   . PAD (peripheral artery disease) ([place]) 08/19/2013  . Claudication of left lower extremity, ABI on Lt now  .80 or less down from 1.0 in 01/2013 01/30/2013  . Normal cardiac stress test, No ischemia 12/15/12 01/30/2013  . Unspecified deficiency anemia 09/05/2012  . Hypertension 09/12/2011  . CAD (coronary artery disease), single vessel disease with collaterals, medical therapy 09/12/2011  . Hyperlipidemia 09/12/2011   PCP:  Leonard Downing, MD Pharmacy:   [facility], [place] - [address] [place] [address] Phone: [phone] Fax: [phone]     Social Determinants of Health (SDOH) Interventions    Readmission Risk Interventions No flowsheet data found.

## 2019-07-20 NOTE — Plan of Care (Signed)
  Problem: Clinical Measurements: Goal: Respiratory complications will improve Outcome: Progressing Pt denies shortness of breath at rest and tolerates 4L [place]. Oxygen saturation mid to high 90s throughout the night.  Goal: Cardiovascular complication will be avoided Outcome: Progressing  Pt's heart rate has been irregular but rate controlled. Nitro gtt and heparin still infusing via IV. Problem: Elimination: Goal: Will not experience complications related to urinary retention Outcome: Progressing  Pt has had adequate urine output via condom catheter. Problem: Pain Managment: Goal: General experience of comfort will improve Outcome: Progressing  Pt has complained of  pain a couple times, medication given (see MAR). Pt is resting in bed with eyes closed at this time, no signs of pain or distress noted. Problem: Safety: Goal: Ability to remain free from injury will improve Outcome: Progressing Pt has not attempted to get out of the bed, even when not fully oriented. Pt uses call bell appropriately.

## 2019-07-20 NOTE — Progress Notes (Signed)
      [address]       [place] [address]             [phone]                 1 Day Post-Op Procedure(s) (LRB): LEFT HEART CATH AND CORONARY ANGIOGRAPHY (N/A)   Events: Aflutter this am Complains of pleuritic chest pain _______________________________________________________________ Vitals: BP 128/68   Pulse 83   Temp 98.2 F (36.8 C) (Oral)   Resp (!) 26   Ht 5\' 3"  (1.6 m)   Wt 82 kg   SpO2 98%   BMI 32.02 kg/m   - Neuro: awake  - Cardiovascular: afib,flutter.  Rate controlled  Drips: nitro, and heparin.     - Pulm: on [place].  Coarse B    ABG No results found for: PHART, PCO2ART, PO2ART, HCO3, TCO2, ACIDBASEDEF, O2SAT  - Abd: soft  .Intake/Output      09/27 0701 - 09/28 0700 09/28 0701 - 09/29 0700   P.O. 350    I.V. (mL/kg) 512 (6.2) 40.9 (0.5)   IV Piggyback     Total Intake(mL/kg) 862 (10.5) 40.9 (0.5)   Urine (mL/kg/hr) 2850 (1.4)    Stool     Total Output 2850    Net -1988.1 +40.9        Urine Occurrence  1 x      _______________________________________________________________ Labs: CBC Latest Ref Rng & Units 07/20/2019 07/19/2019 07/18/2019  WBC 4.0 - 10.5 K/uL 11.8(H) 15.1(H) 12.4(H)  Hemoglobin 13.0 - 17.0 g/dL 9.9(L) 10.4(L) 9.1(L)  Hematocrit 39.0 - 52.0 % 28.9(L) 29.6(L) 27.3(L)  Platelets 150 - 400 K/uL 182 197 150   CMP Latest Ref Rng & Units 07/20/2019 07/19/2019 07/18/2019  Glucose 70 - 99 mg/dL 143(H) 148(H) 125(H)  BUN 8 - 23 mg/dL 44(H) 38(H) 37(H)  Creatinine 0.61 - 1.24 mg/dL 1.58(H) 1.76(H) 1.65(H)  Sodium 135 - 145 mmol/L 140 142 145  Potassium 3.5 - 5.1 mmol/L 3.5 4.0 3.7  Chloride 98 - 111 mmol/L 109 115(H) 119(H)  CO2 22 - 32 mmol/L 18(L) 15(L) 17(L)  Calcium 8.9 - 10.3 mg/dL 8.2(L) 8.2(L) 8.1(L)  Total Protein 6.5 - 8.1 g/dL - - -  Total Bilirubin 0.3 - 1.2 mg/dL - - -  Alkaline Phos 38 - 126 U/L - - -  AST 15 - 41 U/L - - -  ALT 0 - 44 U/L - - -    CXR: Bilateral  infiltrates  _______________________________________________________________  Assessment and Plan: NSTEMI, EF of 25%.  pneumonia  Neuro: stable CV: on hep gtt.  Amio, aspirin, statin, BB.  Will plan for surgery on 9/30.   Pulm: continue abx, and pulm toilet.   Renal: creat downtrending.  Continue to monitor  Dispo: continue ICU care.  OR on Wed.  Melodie Bouillon, MD 07/20/2019 9:48 AM

## 2019-07-20 NOTE — Progress Notes (Signed)
[place] for heparin Indication: ACS/STEMI  Allergies  Allergen Reactions  . Plavix [Clopidogrel Bisulfate] Itching and Rash    Patient Measurements: Height: 5\' 3"  (160 cm) Weight: 180 lb 12.4 oz (82 kg) IBW/kg (Calculated) : 56.9 Heparin Dosing Weight: 73.9kg  Vital Signs: Temp: 98.2 F (36.8 C) (09/28 0739) Temp Source: Oral (09/28 0739) BP: 128/68 (09/28 0900) Pulse Rate: 83 (09/28 0900)  Labs: Recent Labs    07/18/19 0226  07/18/19 2325 07/19/19 0410 07/19/19 0900 07/19/19 1155 07/20/19 0221 07/20/19 0838 07/20/19 0845  HGB 9.1*  --   --  10.4*  --   --  9.9*  --   --   HCT 27.3*  --   --  29.6*  --   --  28.9*  --   --   PLT 150  --   --  197  --   --  182  --   --   HEPARINUNFRC 0.24*  --   --  0.32  --  0.52  --  0.37  --   CREATININE 1.65*  --   --  1.76*  --   --  1.58*  --   --   TROPONINIHS  --    < > 3,206*  --  2,175*  --   --   --  941*   < > = values in this interval not displayed.    Estimated Creatinine Clearance: 36.5 mL/min (A) (by C-G formula based on SCr of 1.58 mg/dL (H)).   Assessment: 50 YOM presenting with sepsis, now with concern for PE vs ACS.  Patient was taken to an urgent LHC this morning due to EKG changes overnight and rising troponins. Found to have total occlusion of RCA with collaterals from left circ. LAD 50-90%. CABG planned for 07/22/19 -heparin level at goal  -hg= 9.9, plt- 182   Goal of Therapy:  Heparin level 0.3-0.7 units/ml Monitor platelets by anticoagulation protocol: Yes   Plan:  No heparin changes needed Daily heparin level, CBC   Hildred Laser, PharmD Clinical Pharmacist **Pharmacist phone directory can now be found on [email or web address] ([email or web address]).  Listed under [place].

## 2019-07-20 NOTE — Progress Notes (Signed)
Assisted tele visit to patient with wife.  Tacie Mccuistion R, RN  

## 2019-07-20 NOTE — Progress Notes (Addendum)
Family Medicine Teaching Service Daily Progress Note Intern Pager: [phone]  Patient name: Philip Hurst Medical record number: [id number] Date of birth: 08-Aug-1941 Age: 78 y.o. Gender: male  Primary Care Provider: Leonard Downing, MD Consultants: Cardiology Code Status:   Code Status: Full Code   Pt Overview and Major Events to Date:  07/16/19: Patient admitted to progressive unit 07/17/19:  Trop 1370, Heparin, Cardiology consulted, ECHO:  EF 55-60% with mildly increased LVH 07/18/19: CTA negative for PE  Assessment and Plan: Philip Hurst is a 78 y.o. male presenting with episode of confusion earlier this morning and few month h/o "not feeling right." PMH is significant for HTN, CAD, HLD, HTN, PAD, former smoker (currently dips)  NSTEMI Troponin on admission 56 trended upward to a maximum of 3396 now downtrending 2175.  ST elevations noted in V2 through V4 on EKGs dated on 9/26, 9/27, 9/28.  Echo from 9/27 shows reduced EF of 25% down from 55% on 9/25.  Cardiology aware.  Taken for left heart cath on 9/27 which showed acute systolic heart failure, CAD of the LAD 70 to 90% and total occlusion of the RCA.  Cardiology recommends IV heparin and nitroglycerin along with gentle diuresis.  CVTS has been consulted and recommends coronary artery bypass surgery once medically stable. -Cardiology following appreciate recommendations -CVTS following appreciate recommendations -Heparin drip -Nitroglycerin drip -ASA 81 -Atorvastatin 80 mg daily -Metoprolol titrate 12.5 mg twice daily -S/p Lasix 80 mg IV once -Norco 10 every 4 hours as needed  Community-acquired pneumonia-improving Afebrile for the past 24 hours, tachypneic up to 30 breaths/min overnight, mild tachycardia up to 110 overnight.  Saturating well on 4 L nasal cannula this morning.  White blood cell count trending down 15.1> 11.8.  Improving based on lack of fever and improving WBC count.  Tachypnea tachycardia likely secondary to  above-noted NSTEMI. - consider BiPAP if continues to worsen - S/p CTX and azithro (9/24- 9/26) - continue Cefdinir (9/26-9/30)  Acute on Chronic Kidney Disease Stage III-improving Cr 1.98>1.65>1.76>1.58. Baseline (~1.3, last record in 2016). May be due to hypervolemia given worsening pulmonary edema - hold mIVF - Avoid nephrotoxic agents - daily BMP   AMS-resolved  Continue to monitor  HTN: Blood pressures well controlled overnight.  Most recently 120/83. 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  - Noroc10-325mg  q6h PRN - gabapentin - Hold ibuprofen in setting of AKI - Miralax and senna PRN  PAD -ASA as above -Atorvastatin as above  HLD -Atorvastatin as above  Allergies - continue home loratadine  FEN/GI: Heart healthy Prophylaxis: Heparin dtt  Disposition: Planning for CABG 9/30  Subjective:  Continues to have significant chest pain this morning.  Subjective shortness of breath appears to be improved on 4 L nasal cannula.  No new complaints this morning.  He reports that his chest pain does not seem to be changing in character or severity.  Objective: Temp:  [98 F (36.7 C)-98.6 F (37 C)] 98.2 F (36.8 C) (09/28 0739) Pulse Rate:  [36-110] 110 (09/28 0800) Resp:  [13-32] 21 (09/28 0800) BP: (108-154)/(68-97) 120/83 (09/28 0800) SpO2:  [87 %-100 %] 97 % (09/28 0800) Weight:  [79.6 kg-82 kg] 82 kg (09/28 0400)  Physical Exam: General: Alert and cooperative and appears to be in no acute distress HEENT: Neck non-tender without lymphadenopathy, masses or  thyromegaly.  No significantly elevated JVD. Cardio: Normal S1 and S2, no S3 or S4.  Irregularly irregular rhythm. No murmurs or rubs.   Pulm: Mild respiratory effort on 4 L nasal cannula.  Rhonchi are auscultated in middle lung fields  bilaterally. Abdomen: Bowel sounds normal. Abdomen soft and non-tender.  Extremities: No peripheral edema. Warm/ well perfused.  Strong radial pulse. Neuro: Cranial nerves grossly intact   Laboratory: Recent Labs  Lab 07/18/19 0226 07/19/19 0410 07/20/19 0221  WBC 12.4* 15.1* 11.8*  HGB 9.1* 10.4* 9.9*  HCT 27.3* 29.6* 28.9*  PLT 150 197 182   Recent Labs  Lab 07/16/19 0909  07/18/19 0226 07/19/19 0410 07/20/19 0221  NA 135   < > 145 142 140  K 4.0   < > 3.7 4.0 3.5  CL 104   < > 119* 115* 109  CO2 14*   < > 17* 15* 18*  BUN 93*   < > 37* 38* 44*  CREATININE 4.89*   < > 1.65* 1.76* 1.58*  CALCIUM 7.5*   < > 8.1* 8.2* 8.2*  PROT 5.7*  --   --   --   --   BILITOT 0.1*  --   --   --   --   ALKPHOS 92  --   --   --   --   ALT 22  --   --   --   --   AST 25  --   --   --   --   GLUCOSE 103*   < > 125* 148* 143*   < > = values in this interval not displayed.     Imaging/Diagnostic Tests:  Dg Chest Port 1 View  Result Date: 07/19/2019 CLINICAL DATA:  Shortness of breath. EXAM: PORTABLE CHEST 1 VIEW COMPARISON:  Radiograph of July 18, 2019. FINDINGS: Stable cardiomediastinal silhouette. No pneumothorax or pleural effusion is noted. Stable bilateral lung opacities are noted concerning for edema or possibly pneumonia. Bony thorax unremarkable. IMPRESSION: Stable bilateral lung opacities as described above. Electronically Signed   By: Marijo Conception M.D.   On: 07/19/2019 09:52      Matilde Haymaker, MD 07/20/2019, 8:23 AM PGY-1, [place] Intern pager: [phone], text pages welcome

## 2019-07-20 NOTE — Progress Notes (Addendum)
Progress Note  Patient Name: Philip Hurst Date of Encounter: 07/20/2019  Primary Cardiologist: No primary care provider on file.   Subjective   [name] is feeling somewhat improved today. He reports continued intermittent chest pressure that does not radiate and is alleviated with nitro. He reports improved but continued pleuritic pain. When asked to point to his pain he points to his sternum. He is not tender to palpation. He denies orthopnea. He denies infectious sx such as fevers, chills and myalgias. He doesn't remember much about coming in and reports being in his regular state of health prior to then. He lives here in [place] with his wife of over 51 years.   Inpatient Medications    Scheduled Meds: . aspirin EC  81 mg Oral Daily  . atorvastatin  80 mg Oral q1800  . cefdinir  300 mg Oral Q12H  . Chlorhexidine Gluconate Cloth  6 each Topical Daily  . gabapentin  100 mg Oral QHS  . loratadine  10 mg Oral Daily  . metoprolol tartrate  12.5 mg Oral BID  . sodium chloride flush  3 mL Intravenous Q12H  . sodium chloride flush  3 mL Intravenous Q12H   Continuous Infusions: . sodium chloride    . amiodarone 60 mg/hr (07/20/19 1106)   Followed by  . amiodarone    . heparin 1,350 Units/hr (07/20/19 0900)  . nitroGLYCERIN 30 mcg/min (07/20/19 0900)   PRN Meds: sodium chloride, acetaminophen, HYDROcodone-acetaminophen, ondansetron (ZOFRAN) IV, polyethylene glycol, senna, sodium chloride flush   Vital Signs    Vitals:   07/20/19 0800 07/20/19 0900 07/20/19 1000 07/20/19 1118  BP: 120/83 128/68 119/68   Pulse: (!) 110 83 98   Resp: (!) 21 (!) 26 (!) 23   Temp:    97.9 F (36.6 C)  TempSrc:    Oral  SpO2: 97% 98% 97%   Weight:      Height:        Intake/Output Summary (Last 24 hours) at 07/20/2019 1124 Last data filed at 07/20/2019 0900 Gross per 24 hour  Intake 902.86 ml  Output 2850 ml  Net -1947.14 ml   Last 3 Weights 07/20/2019 07/19/2019 07/19/2019  Weight  (lbs) 180 lb 12.4 oz 175 lb 7.8 oz 180 lb 1.9 oz  Weight (kg) 82 kg 79.6 kg 81.7 kg      Telemetry    New onset afib/aflutter around 3 AM - Personally Reviewed  ECG    Aflutter, ST and T wave changes in the anterolateral leads, prolonged QT - Personally Reviewed  Physical Exam   GEN: No acute distress.   Neck: No JVD Cardiac: RRR, no murmurs, rubs, or gallops. Trace lower extremity edema. Chest non-tender to palpation. Respiratory: coarse breath sounds on the right GI: Soft, nontender, non-distended  MS: No deformity. Neuro:  Nonfocal; alert and oriented  Psych: Normal affect   Labs    High Sensitivity Troponin:   Recent Labs  Lab 07/17/19 0631 07/18/19 1243 07/18/19 2325 07/19/19 0900 07/20/19 0845  TROPONINIHS 1,370* 3,396* 3,206* 2,175* 941*      Chemistry Recent Labs  Lab 07/16/19 0909  07/17/19 0206  07/18/19 0226 07/19/19 0410 07/20/19 0221  NA 135   < > 142   < > 145 142 140  K 4.0   < > 3.9   < > 3.7 4.0 3.5  CL 104   < > 117*   < > 119* 115* 109  CO2 14*   < >  14*   < > 17* 15* 18*  GLUCOSE 103*   < > 86   < > 125* 148* 143*  BUN 93*   < > 59*   < > 37* 38* 44*  CREATININE 4.89*   < > 2.61*   < > 1.65* 1.76* 1.58*  CALCIUM 7.5*   < > 7.7*   < > 8.1* 8.2* 8.2*  PROT 5.7*  --   --   --   --   --   --   ALBUMIN 3.1*  --  2.3*  --  2.3*  --  2.3*  AST 25  --   --   --   --   --   --   ALT 22  --   --   --   --   --   --   ALKPHOS 92  --   --   --   --   --   --   BILITOT 0.1*  --   --   --   --   --   --   GFRNONAA 11*   < > 23*   < > 39* 36* 41*  GFRAA 12*   < > 26*   < > 45* 42* 48*  ANIONGAP 17*   < > 11   < > 9 12 13    < > = values in this interval not displayed.     Hematology Recent Labs  Lab 07/18/19 0226 07/19/19 0410 07/20/19 0221  WBC 12.4* 15.1* 11.8*  RBC 2.93* 3.24* 3.19*  HGB 9.1* 10.4* 9.9*  HCT 27.3* 29.6* 28.9*  MCV 93.2 91.4 90.6  MCH 31.1 32.1 31.0  MCHC 33.3 35.1 34.3  RDW 14.3 14.5 14.4  PLT 150 197 182     BNPNo results for input(s): BNP, PROBNP in the last 168 hours.   DDimer  Recent Labs  Lab 07/16/19 0928  DDIMER 1.38*     Radiology    Ct Angio Chest Pe W Or Wo Contrast  Result Date: 07/18/2019 CLINICAL DATA:  Cough and pneumonia. Elevated D-dimer. History of DVT. EXAM: CT ANGIOGRAPHY CHEST WITH CONTRAST TECHNIQUE: Multidetector CT imaging of the chest was performed using the standard protocol during bolus administration of intravenous contrast. Multiplanar CT image reconstructions and MIPs were obtained to evaluate the vascular anatomy. CONTRAST:  66mL OMNIPAQUE IOHEXOL 350 MG/ML SOLN COMPARISON:  Chest x-ray July 16, 2019 FINDINGS: Cardiovascular: Atherosclerotic changes are seen in the nonaneurysmal aorta. Coronary artery calcifications involve the right and left coronary arteries. The heart size borderline to mildly enlarged. No pulmonary emboli. Mediastinum/Nodes: The thyroid and esophagus are normal. Several mildly enlarged mediastinal nodes are identified. A right paratracheal node measures 1.6 cm on series 5, image 57. A 1.7 cm node is seen to the right of the esophagus on series 5, image 25. There is also some increased attenuation the mediastinal fat such as in the AP window on axial image 53. Lungs/Pleura: Central airways are unremarkable. No pneumothorax identified. Bilateral pulmonary infiltrates are multifocal, most prominent in the right upper lower lobes. There is also involvement of the right middle lobe, left upper lobe, and left lower lobe. Mild interlobular septal thickening. No pulmonary nodules or masses. Upper Abdomen: A small amount of ascites is seen adjacent to the liver on series 5, image 125. The upper abdomen is otherwise unremarkable. Musculoskeletal: No chest wall abnormality. No acute or significant osseous findings. Review of the MIP images confirms the above findings. IMPRESSION: 1.  Multifocal infiltrates in the lungs are most consistent with pneumonia. 2.  Interlobular septal thickening and pleural effusions suggest superimposed mild edema. 3. Shotty and mildly enlarged nodes in the mediastinum are likely reactive. Recommend attention on follow-up. 4. Increased attenuation scattered the mediastinal fat occluding in the AP window is nonspecific but could be due to volume overload. Recommend attention on follow-up. 5. Minimal ascites in the upper abdomen is nonspecific. Electronically Signed   By: Dorise Bullion III M.D   On: 07/18/2019 14:59   Dg Chest Port 1 View  Result Date: 07/19/2019 CLINICAL DATA:  Shortness of breath. EXAM: PORTABLE CHEST 1 VIEW COMPARISON:  Radiograph of July 18, 2019. FINDINGS: Stable cardiomediastinal silhouette. No pneumothorax or pleural effusion is noted. Stable bilateral lung opacities are noted concerning for edema or possibly pneumonia. Bony thorax unremarkable. IMPRESSION: Stable bilateral lung opacities as described above. Electronically Signed   By: Marijo Conception M.D.   On: 07/19/2019 09:52   Dg Chest Port 1 View  Result Date: 07/18/2019 CLINICAL DATA:  Pneumonia.  Shortness of breath and volume overload. EXAM: PORTABLE CHEST 1 VIEW COMPARISON:  07/16/2019 FINDINGS: Mild cardiomegaly. Bilateral lower lobe and right upper lobe airspace opacities are identified. Mild diffuse superimposed pulmonary edema. Osseous structures appear intact. IMPRESSION: 1. Worsening bilateral airspace opacities with new superimposed pulmonary edema. Electronically Signed   By: Kerby Moors M.D.   On: 07/18/2019 19:02    Cardiac Studies   07/19/2019 ECHO:   1. Left ventricular ejection fraction, by visual estimation, is 25%. The left ventricle has severely decreased function. There is no left ventricular hypertrophy.  2. Left ventricular diastolic Doppler parameters are indeterminate pattern of LV diastolic filling.  3. The anteroseptal wall and apex are akinetic.  4. Global right ventricle has normal systolic function.The right  ventricular size is normal. No increase in right ventricular wall thickness.  5. Left atrial size was normal.  6. Right atrial size was normal.  7. The mitral valve is abnormal. No evidence of mitral valve regurgitation. No evidence of mitral stenosis.  8. The tricuspid valve is normal in structure. Tricuspid valve regurgitation was not visualized by color flow Doppler.  9. The aortic valve has an indeterminant number of cusps Aortic valve regurgitation was not visualized by color flow Doppler. Mild aortic valve sclerosis without stenosis. 10. The pulmonic valve was not well visualized. Pulmonic valve regurgitation is not visualized by color flow Doppler. 11. The inferior vena cava is normal in size with greater than 50% respiratory variability, suggesting right atrial pressure of 3 mmHg. 12. Acute decrease in LVEF and acute wall motion abnormalities as reported above compared to 07/17/19 echo  07/19/2019 LHC: 1. Acute systolic heart failure with precipitous drop in LVEF with anteroapical wall motion abnormality likely related to acute ischemia.  Cannot totally exclude the possibility of a stress cardiomyopathy component.  LVEDP is 20 mmHg. 2. Severe coronary artery disease with segmental 70 to 90% proximal to mid LAD stenosis. 3. 60% second obtuse marginal.  The first marginal is small. 4. Total occlusion, flush, RCA with large collateral bed from left circumflex to distal left ventricular branch.  Patient Profile     78 y.o. male w/ a PMHx significant for medically managed CAD (RCA disease), HTN, HLD and PAD who presented with AMS and imaging findings concerning for pneumonia with EKG without acute ischemic changes initially who then developed lateral TWIs and ST elevation in the anterior and lateral precordial leads with a troponemia up to  3396, a precipitous drop in LVEF from 55-60% on admission down to 25-30% with anteroapical wall motion abnormality who went for LHC showing severe CAD w/  segmental 70-90% proximal to mid LAD stenosis, 60% second obtuse marginal, total occlusion RCA now in new afib/aflutter on antibiotics and awaiting CABG on Wednesday.     Assessment & Plan    Acute MI: -s/p LHC demonstrating severe CAD w/ segmental 70-90% proximal to mid LAD stenosis, 60% second obtuse marginal, total occlusion RCA -trop downtrending from peak from 3396 -continues to have pleuritic chest pain may be secondary to pneumonia vs pericarditis in setting of acute MI -on IV heparin/IV nitro -Plan for surgical revascularization on Wednesday; discussed best strategy, CABG vs PCI, with CT surgery who agrees with plan  -continue medical management with aspirin, atorvastatin 80, metop 12.5 BID, no ace/arb yet given kidney function and CABG Wednesday -starting IV amiodarone today for new aflutter -daily CBC, BMP and mag  Acute Heart Failure -initial Echo w/ LVEF 55-60% with precipitous drop to 25-30% in setting of acute MI -euvolemic on exam, patient without orthopnea -holding lasix  -continue maximum medical therapy as above with plan for surgical revasc on Wednesday  AKI -AKI in setting of acute illness downtrending with fluids and avoidance of nephrotoxins -Creatinine 1.58 today up slightly from baseline of 1.3    For questions or updates, please contact [facility] Please consult [email or web address] for contact info under   Signed, Al Decant, MD  07/20/2019, 11:24 AM    Patient seen, examined. Available data reviewed. Agree with findings, assessment, and plan as outlined by Al Decant, MD.  On my exam: Vitals:   07/20/19 1000 07/20/19 1118  BP: 119/68   Pulse: 98   Resp: (!) 23   Temp:  97.9 F (36.6 C)  SpO2: 97%    Pt is alert and oriented, NAD HEENT: normal Neck: JVP - normal, carotids 2+=  Lungs: diminished in the bases bilaterally CV: irregularly irregular without murmur or gallop Abd: soft, NT, Positive BS, no hepatomegaly Ext: no C/C/E, distal  pulses intact and equal Skin: warm/dry no rash  Cardiac catheterization films, telemetry, lab, and radiographic data all reviewed.  The patient has had a large LAD territory infarction with severe LV dysfunction.  His chest pain this morning appears to primarily be pleuritic pain.  He went into atrial flutter this morning at approximately 4 AM.  He continues on IV heparin and nitroglycerin with some improvement in his chest discomfort.  Will start IV amiodarone to treat atrial flutter with RVR.  I discussed his case with Dr. Kipp Brood with tentative plans for multivessel CABG on Wednesday.  Will change metoprolol to carvedilol in the setting of severe LV dysfunction.  Would avoid ACE/ARB/ARNI preoperatively as the patient has had significant kidney injury now trending back down but remains at high risk of recurrent AKI. Will need aggressive HF Rx post-operatively.   Sherren Mocha, M.D. 07/20/2019 11:37 AM

## 2019-07-21 ENCOUNTER — Inpatient Hospital Stay (HOSPITAL_COMMUNITY): Payer: Medicare Other

## 2019-07-21 DIAGNOSIS — Z01810 Encounter for preprocedural cardiovascular examination: Secondary | ICD-10-CM

## 2019-07-21 LAB — CULTURE, BLOOD (ROUTINE X 2)
Culture: NO GROWTH
Special Requests: ADEQUATE

## 2019-07-21 LAB — PULMONARY FUNCTION TEST
FEF 25-75 Pre: 0.8 L/sec
FEF2575-%Pred-Pre: 54 %
FEV1-%Pred-Pre: 47 %
FEV1-Pre: 1 L
FEV1FVC-%Pred-Pre: 107 %
FEV6-%Pred-Pre: 47 %
FEV6-Pre: 1.3 L
FEV6FVC-%Pred-Pre: 108 %
FVC-%Pred-Pre: 43 %
FVC-Pre: 1.3 L
Pre FEV1/FVC ratio: 77 %
Pre FEV6/FVC Ratio: 100 %

## 2019-07-21 LAB — BASIC METABOLIC PANEL
Anion gap: 10 (ref 5–15)
BUN: 44 mg/dL — ABNORMAL HIGH (ref 8–23)
CO2: 21 mmol/L — ABNORMAL LOW (ref 22–32)
Calcium: 8.1 mg/dL — ABNORMAL LOW (ref 8.9–10.3)
Chloride: 109 mmol/L (ref 98–111)
Creatinine, Ser: 1.42 mg/dL — ABNORMAL HIGH (ref 0.61–1.24)
GFR calc Af Amer: 54 mL/min — ABNORMAL LOW (ref >60)
GFR calc non Af Amer: 47 mL/min — ABNORMAL LOW (ref >60)
Glucose, Bld: 127 mg/dL — ABNORMAL HIGH (ref 70–99)
Potassium: 3.6 mmol/L (ref 3.5–5.1)
Sodium: 140 mmol/L (ref 135–145)

## 2019-07-21 LAB — HEPARIN LEVEL (UNFRACTIONATED)
Heparin Unfractionated: 0.29 IU/mL — ABNORMAL LOW (ref 0.30–0.70)
Heparin Unfractionated: 0.4 IU/mL (ref 0.30–0.70)
Heparin Unfractionated: 0.28 IU/mL — ABNORMAL LOW (ref 0.30–0.70)

## 2019-07-21 LAB — CBC
HCT: 29.4 % — ABNORMAL LOW (ref 39.0–52.0)
Hemoglobin: 10.3 g/dL — ABNORMAL LOW (ref 13.0–17.0)
MCH: 32.2 pg (ref 26.0–34.0)
MCHC: 35 g/dL (ref 30.0–36.0)
MCV: 91.9 fL (ref 80.0–100.0)
Platelets: 186 10*3/uL (ref 150–400)
RBC: 3.2 MIL/uL — ABNORMAL LOW (ref 4.22–5.81)
RDW: 14.6 % (ref 11.5–15.5)
WBC: 11.7 10*3/uL — ABNORMAL HIGH (ref 4.0–10.5)
nRBC: 0 % (ref 0.0–0.2)

## 2019-07-21 LAB — MAGNESIUM: Magnesium: 2 mg/dL (ref 1.7–2.4)

## 2019-07-21 LAB — HEMOGLOBIN A1C
Hgb A1c MFr Bld: 6 % — ABNORMAL HIGH (ref 4.8–5.6)
Mean Plasma Glucose: 125.5 mg/dL

## 2019-07-21 LAB — PREPARE RBC (CROSSMATCH)

## 2019-07-21 LAB — ABO/RH: ABO/RH(D): B POS

## 2019-07-21 MED ORDER — CHLORHEXIDINE GLUCONATE CLOTH 2 % EX PADS
6.0000 | MEDICATED_PAD | Freq: Once | CUTANEOUS | Status: AC
  Administered 2019-07-21: 6 via TOPICAL

## 2019-07-21 MED ORDER — TRANEXAMIC ACID (OHS) PUMP PRIME SOLUTION
2.0000 mg/kg | INTRAVENOUS | Status: DC
  Filled 2019-07-21: qty 1.64

## 2019-07-21 MED ORDER — INSULIN REGULAR(HUMAN) IN NACL 100-0.9 UT/100ML-% IV SOLN
INTRAVENOUS | Status: AC
  Administered 2019-07-22: 1 [IU]/h via INTRAVENOUS
  Filled 2019-07-21: qty 100

## 2019-07-21 MED ORDER — VANCOMYCIN HCL 1000 MG IV SOLR
INTRAVENOUS | Status: AC
  Administered 2019-07-22: 500 mL
  Filled 2019-07-21: qty 1000

## 2019-07-21 MED ORDER — NITROGLYCERIN IN D5W 200-5 MCG/ML-% IV SOLN
2.0000 ug/min | INTRAVENOUS | Status: AC
  Administered 2019-07-22: 16.6 ug/min via INTRAVENOUS
  Filled 2019-07-21: qty 250

## 2019-07-21 MED ORDER — TRANEXAMIC ACID (OHS) BOLUS VIA INFUSION
15.0000 mg/kg | INTRAVENOUS | Status: AC
  Administered 2019-07-22: 1230 mg via INTRAVENOUS
  Filled 2019-07-21: qty 1230

## 2019-07-21 MED ORDER — DOPAMINE-DEXTROSE 3.2-5 MG/ML-% IV SOLN
0.0000 ug/kg/min | INTRAVENOUS | Status: DC
  Filled 2019-07-21: qty 250

## 2019-07-21 MED ORDER — CHLORHEXIDINE GLUCONATE 0.12 % MT SOLN
15.0000 mL | Freq: Once | OROMUCOSAL | Status: AC
  Administered 2019-07-22: 15 mL via OROMUCOSAL
  Filled 2019-07-21: qty 15

## 2019-07-21 MED ORDER — VANCOMYCIN HCL 10 G IV SOLR
1250.0000 mg | INTRAVENOUS | Status: AC
  Administered 2019-07-22: 1250 mg via INTRAVENOUS
  Filled 2019-07-21: qty 1250

## 2019-07-21 MED ORDER — SODIUM CHLORIDE 0.9 % IV SOLN
750.0000 mg | INTRAVENOUS | Status: DC
  Filled 2019-07-21: qty 750

## 2019-07-21 MED ORDER — CHLORHEXIDINE GLUCONATE CLOTH 2 % EX PADS
6.0000 | MEDICATED_PAD | Freq: Once | CUTANEOUS | Status: AC
  Administered 2019-07-22: 05:00:00 6 via TOPICAL

## 2019-07-21 MED ORDER — BISACODYL 5 MG PO TBEC
5.0000 mg | DELAYED_RELEASE_TABLET | Freq: Once | ORAL | Status: AC
  Administered 2019-07-21: 18:00:00 5 mg via ORAL
  Filled 2019-07-21: qty 1

## 2019-07-21 MED ORDER — TEMAZEPAM 15 MG PO CAPS
15.0000 mg | ORAL_CAPSULE | Freq: Once | ORAL | Status: AC | PRN
  Administered 2019-07-21: 15 mg via ORAL
  Filled 2019-07-21: qty 1

## 2019-07-21 MED ORDER — SODIUM CHLORIDE 0.9 % IV SOLN
1.5000 g | INTRAVENOUS | Status: AC
  Administered 2019-07-22: 1.5 g via INTRAVENOUS
  Filled 2019-07-21: qty 1.5

## 2019-07-21 MED ORDER — TRANEXAMIC ACID 1000 MG/10ML IV SOLN
1.5000 mg/kg/h | INTRAVENOUS | Status: DC
  Filled 2019-07-21: qty 25

## 2019-07-21 MED ORDER — EPINEPHRINE HCL 5 MG/250ML IV SOLN IN NS
0.0000 ug/min | INTRAVENOUS | Status: DC
  Filled 2019-07-21: qty 250

## 2019-07-21 MED ORDER — PHENYLEPHRINE HCL-NACL 20-0.9 MG/250ML-% IV SOLN
30.0000 ug/min | INTRAVENOUS | Status: DC
  Filled 2019-07-21: qty 250

## 2019-07-21 MED ORDER — DEXMEDETOMIDINE HCL IN NACL 400 MCG/100ML IV SOLN
0.1000 ug/kg/h | INTRAVENOUS | Status: DC
  Filled 2019-07-21: qty 100

## 2019-07-21 MED ORDER — POTASSIUM CHLORIDE 2 MEQ/ML IV SOLN
80.0000 meq | INTRAVENOUS | Status: DC
  Filled 2019-07-21: qty 40

## 2019-07-21 MED ORDER — MILRINONE LACTATE IN DEXTROSE 20-5 MG/100ML-% IV SOLN
0.3000 ug/kg/min | INTRAVENOUS | Status: DC
  Filled 2019-07-21: qty 100

## 2019-07-21 MED ORDER — SODIUM CHLORIDE 0.9 % IV SOLN
INTRAVENOUS | Status: DC
  Filled 2019-07-21: qty 30

## 2019-07-21 MED ORDER — POTASSIUM CHLORIDE CRYS ER 20 MEQ PO TBCR
20.0000 meq | EXTENDED_RELEASE_TABLET | Freq: Two times a day (BID) | ORAL | Status: AC
  Administered 2019-07-21 (×2): 20 meq via ORAL
  Filled 2019-07-21 (×2): qty 1

## 2019-07-21 MED ORDER — PLASMA-LYTE 148 IV SOLN
INTRAVENOUS | Status: AC
  Administered 2019-07-22: 500 mL
  Filled 2019-07-21: qty 2.5

## 2019-07-21 MED ORDER — MANNITOL 20 % IV SOLN
Freq: Once | INTRAVENOUS | Status: DC
  Filled 2019-07-21: qty 13

## 2019-07-21 MED ORDER — NOREPINEPHRINE 4 MG/250ML-% IV SOLN
0.0000 ug/min | INTRAVENOUS | Status: AC
  Administered 2019-07-22: 4 ug/min via INTRAVENOUS
  Filled 2019-07-21: qty 250

## 2019-07-21 MED ORDER — METOPROLOL TARTRATE 12.5 MG HALF TABLET
12.5000 mg | ORAL_TABLET | Freq: Once | ORAL | Status: AC
  Administered 2019-07-22: 12.5 mg via ORAL
  Filled 2019-07-21: qty 1

## 2019-07-21 NOTE — Progress Notes (Signed)
CARDIAC REHAB PHASE I   Discussed sternal precautions, IS, mobility post op and d/c planning with pt. Good reception. He is practicing IS; 750 mL max. He sts that before admit he could walk 100-300 ft independently before he had claudication sx. His wife and daughters will be with him at d/c.  [place], ACSM 07/21/2019 2:46 PM

## 2019-07-21 NOTE — Progress Notes (Signed)
Physical Therapy Discharge Patient Details Name: JASSIR STRAWDERMAN MRN: [id number] DOB: 05-30-1941 Today's Date: 07/21/2019 Time: [id number] PT Time Calculation (min) (ACUTE ONLY): 18 min  Patient discharged from PT services secondary to surgery - will need to re-order PT to resume therapy services.  Please see latest therapy progress note for current level of functioning and progress toward goals.    Progress and discharge plan discussed with patient and/or caregiver: Patient/Caregiver agrees with plan  GP     Denice Paradise 07/21/2019, 12:00 PM  [place] Pager:  [phone]  Office:  [phone]

## 2019-07-21 NOTE — Anesthesia Preprocedure Evaluation (Addendum)
Anesthesia Evaluation  Patient identified by MRN, date of birth, ID band Patient awake    Reviewed: Allergy & Precautions, NPO status , Patient's Chart, lab work & pertinent test results  Airway Mallampati: II  TM Distance: >3 FB Neck ROM: Full    Dental  (+) Teeth Intact, Dental Advisory Given   Pulmonary former smoker,    Pulmonary exam normal breath sounds clear to auscultation       Cardiovascular hypertension, Pt. on medications + CAD (RCA) and + Peripheral Vascular Disease  Normal cardiovascular exam Rhythm:Regular Rate:Normal  Echo 07/19/2019: 1. Left ventricular ejection fraction, by visual estimation, is 25%. The left ventricle has severely decreased function. There is no left ventricular hypertrophy.  2. Left ventricular diastolic Doppler parameters are indeterminate pattern of LV diastolic filling.  3. The anteroseptal wall and apex are akinetic.  4. Global right ventricle has normal systolic function.The right ventricular size is normal. No increase in right ventricular wall thickness.  5. Left atrial size was normal.  6. Right atrial size was normal.  7. The mitral valve is abnormal. No evidence of mitral valve regurgitation. No evidence of mitral stenosis.  8. The tricuspid valve is normal in structure. Tricuspid valve regurgitation was not visualized by color flow Doppler.  9. The aortic valve has an indeterminant number of cusps Aortic valve regurgitation was not visualized by color flow Doppler. Mild aortic valve sclerosis without stenosis. 10. The pulmonic valve was not well visualized. Pulmonic valve regurgitation is not visualized by color flow Doppler. 11. The inferior vena cava is normal in size with greater than 50% respiratory variability, suggesting right atrial pressure of 3 mmHg. 12. Acute decrease in LVEF and acute wall motion abnormalities as reported above compared to 07/17/19 echo   Neuro/Psych negative  neurological ROS     GI/Hepatic negative GI ROS, Neg liver ROS,   Endo/Other  Obesity   Renal/GU negative Renal ROS     Musculoskeletal  (+) Arthritis ,   Abdominal   Peds  Hematology  (+) Blood dyscrasia, anemia ,   Anesthesia Other Findings Day of surgery medications reviewed with the patient.  Reproductive/Obstetrics                            Anesthesia Physical Anesthesia Plan  ASA: IV  Anesthesia Plan: General   Post-op Pain Management:    Induction: Intravenous  PONV Risk Score and Plan: 2 and Treatment may vary due to age or medical condition  Airway Management Planned: Oral ETT  Additional Equipment: Arterial line, CVP, PA Cath, TEE and Ultrasound Guidance Line Placement  Intra-op Plan:   Post-operative Plan: Post-operative intubation/ventilation  Informed Consent: I have reviewed the patients History and Physical, chart, labs and discussed the procedure including the risks, benefits and alternatives for the proposed anesthesia with the patient or authorized representative who has indicated his/her understanding and acceptance.     Dental advisory given  Plan Discussed with: CRNA  Anesthesia Plan Comments:        Anesthesia Quick Evaluation

## 2019-07-21 NOTE — Progress Notes (Addendum)
Progress Note  Patient Name: Philip Hurst Date of Encounter: 07/21/2019  Primary Cardiologist: No primary care provider on file.   Subjective   [name] is feeling the best he's felt since he's been here. He continues to experience some pleuritic pain with deep inspiration, however, it is much improved from prior. He continues to have some dull substernal chest pain that is also improved. He does not feel short of breath and denies orthopnea. He denies abdominal pain. We discussed the importance of using his incentive spirometer today.   Inpatient Medications    Scheduled Meds: . aspirin EC  81 mg Oral Daily  . atorvastatin  80 mg Oral q1800  . carvedilol  3.125 mg Oral BID WC  . cefdinir  300 mg Oral Q12H  . Chlorhexidine Gluconate Cloth  6 each Topical Daily  . gabapentin  100 mg Oral QHS  . loratadine  10 mg Oral Daily  . oxyCODONE  10 mg Oral Q12H  . potassium chloride  20 mEq Oral BID  . sodium chloride flush  3 mL Intravenous Q12H  . sodium chloride flush  3 mL Intravenous Q12H   Continuous Infusions: . sodium chloride    . amiodarone 30 mg/hr (07/21/19 0900)  . heparin 1,600 Units/hr (07/21/19 0900)  . nitroGLYCERIN Stopped (07/20/19 1320)   PRN Meds: sodium chloride, acetaminophen, HYDROcodone-acetaminophen, ondansetron (ZOFRAN) IV, polyethylene glycol, senna, sodium chloride flush   Vital Signs    Vitals:   07/21/19 0700 07/21/19 0738 07/21/19 0800 07/21/19 0900  BP: (!) 147/87  (!) 146/83 134/75  Pulse: 84  78 78  Resp: 20  (!) 26   Temp:  98.8 F (37.1 C)    TempSrc:  Oral    SpO2: 97%  100% 95%  Weight:      Height:        Intake/Output Summary (Last 24 hours) at 07/21/2019 0957 Last data filed at 07/21/2019 0900 Gross per 24 hour  Intake 894.81 ml  Output 1200 ml  Net -305.19 ml   Last 3 Weights 07/20/2019 07/19/2019 07/19/2019  Weight (lbs) 180 lb 12.4 oz 175 lb 7.8 oz 180 lb 1.9 oz  Weight (kg) 82 kg 79.6 kg 81.7 kg      Telemetry    Pt  converted to NSR yesterday evening - Personally Reviewed  ECG    07/20/2019: Aflutter, ST and T wave changes in the anterolateral leads, prolonged QT - Personally Reviewed  Physical Exam   GEN: No acute distress.   Neck: No JVD Cardiac: RRR, no murmurs, rubs, or gallops. Trace lower extremity edema. Chest non-tender to palpation. Respiratory: coarse breath sounds diffusely GI: Soft, nontender, non-distended  MS: No deformity. Neuro:  Nonfocal; alert and oriented  Psych: Normal affect   Labs    High Sensitivity Troponin:   Recent Labs  Lab 07/17/19 0631 07/18/19 1243 07/18/19 2325 07/19/19 0900 07/20/19 0845  TROPONINIHS 1,370* 3,396* 3,206* 2,175* 941*      Chemistry Recent Labs  Lab 07/16/19 [id number]  07/17/19 0206  07/18/19 0226 07/19/19 0410 07/20/19 0221 07/21/19 0223  NA 135   < > 142   < > 145 142 140 140  K 4.0   < > 3.9   < > 3.7 4.0 3.5 3.6  CL 104   < > 117*   < > 119* 115* 109 109  CO2 14*   < > 14*   < > 17* 15* 18* 21*  GLUCOSE 103*   < >  86   < > 125* 148* 143* 127*  BUN 93*   < > 59*   < > 37* 38* 44* 44*  CREATININE 4.89*   < > 2.61*   < > 1.65* 1.76* 1.58* 1.42*  CALCIUM 7.5*   < > 7.7*   < > 8.1* 8.2* 8.2* 8.1*  PROT 5.7*  --   --   --   --   --   --   --   ALBUMIN 3.1*  --  2.3*  --  2.3*  --  2.3*  --   AST 25  --   --   --   --   --   --   --   ALT 22  --   --   --   --   --   --   --   ALKPHOS 92  --   --   --   --   --   --   --   BILITOT 0.1*  --   --   --   --   --   --   --   GFRNONAA 11*   < > 23*   < > 39* 36* 41* 47*  GFRAA 12*   < > 26*   < > 45* 42* 48* 54*  ANIONGAP 17*   < > 11   < > 9 12 13 10    < > = values in this interval not displayed.     Hematology Recent Labs  Lab 07/19/19 0410 07/20/19 0221 07/21/19 0223  WBC 15.1* 11.8* 11.7*  RBC 3.24* 3.19* 3.20*  HGB 10.4* 9.9* 10.3*  HCT 29.6* 28.9* 29.4*  MCV 91.4 90.6 91.9  MCH 32.1 31.0 32.2  MCHC 35.1 34.3 35.0  RDW 14.5 14.4 14.6  PLT 197 182 186    BNPNo  results for input(s): BNP, PROBNP in the last 168 hours.   DDimer  Recent Labs  Lab 07/16/19 [id number]  DDIMER 1.38*     Radiology    No results found.  Cardiac Studies   07/19/2019 ECHO:   1. Left ventricular ejection fraction, by visual estimation, is 25%. The left ventricle has severely decreased function. There is no left ventricular hypertrophy.  2. Left ventricular diastolic Doppler parameters are indeterminate pattern of LV diastolic filling.  3. The anteroseptal wall and apex are akinetic.  4. Global right ventricle has normal systolic function.The right ventricular size is normal. No increase in right ventricular wall thickness.  5. Left atrial size was normal.  6. Right atrial size was normal.  7. The mitral valve is abnormal. No evidence of mitral valve regurgitation. No evidence of mitral stenosis.  8. The tricuspid valve is normal in structure. Tricuspid valve regurgitation was not visualized by color flow Doppler.  9. The aortic valve has an indeterminant number of cusps Aortic valve regurgitation was not visualized by color flow Doppler. Mild aortic valve sclerosis without stenosis. 10. The pulmonic valve was not well visualized. Pulmonic valve regurgitation is not visualized by color flow Doppler. 11. The inferior vena cava is normal in size with greater than 50% respiratory variability, suggesting right atrial pressure of 3 mmHg. 12. Acute decrease in LVEF and acute wall motion abnormalities as reported above compared to 07/17/19 echo  07/19/2019 LHC: 1. Acute systolic heart failure with precipitous drop in LVEF with anteroapical wall motion abnormality likely related to acute ischemia.  Cannot totally exclude the possibility of a stress cardiomyopathy component.  LVEDP  is 20 mmHg. 2. Severe coronary artery disease with segmental 70 to 90% proximal to mid LAD stenosis. 3. 60% second obtuse marginal.  The first marginal is small. 4. Total occlusion, flush, RCA with large  collateral bed from left circumflex to distal left ventricular branch.  Patient Profile     78 y.o. male w/ a PMHx significant for medically managed CAD (RCA disease), HTN, HLD and PAD who presented with AMS and imaging findings concerning for pneumonia with EKG without acute ischemic changes initially who then developed lateral TWIs and ST elevation in the anterior and lateral precordial leads with a troponemia up to 3396, a precipitous drop in LVEF from 55-60% on admission down to 25-30% with anteroapical wall motion abnormality who went for LHC showing severe CAD w/ segmental 70-90% proximal to mid LAD stenosis, 60% second obtuse marginal, total occlusion RCA on IV heparin, IV amiodorone for afib/aflutter (now in NSR) and antibiotics awaiting CABG on Wednesday.     Assessment & Plan    Acute MI: -s/p LHC demonstrating severe CAD w/ segmental 70-90% proximal to mid LAD stenosis, 60% second obtuse marginal, total occlusion RCA -trop downtrended from peak from 3396 -continues to have pleuritic pain with deep inspiration which may be secondary to pneumonia vs pericarditis in setting of acute MI; much improved from yesterday, however -on IV heparin/IV amio for new aflutter (converted to NSR yesterday evening); IV nitro stopped yesterday without increase in pain -Plan for surgical revascularization on Wednesday; discussed best strategy, CABG vs PCI, with Dr. Kipp Brood who agrees with plan  -continue medical management with aspirin, atorvastatin 80, metop 12.5 BID, no ace/arb yet  -continue IV heparin  -continue IV amio given patient going for surgery tomorrow with likelihood of going back into afib/flutter -daily CBC, BMP and mag  Acute Heart Failure -initial Echo w/ LVEF 55-60% with precipitous drop to 25-30% in setting of acute MI -euvolemic on exam, patient without orthopnea -holding lasix  -continue maximum medical therapy as above with plan for surgical revasc w/ atriclip on Wednesday  AKI  (resolved) -AKI in setting of acute illness downtrending with fluids and avoidance of nephrotoxins -Creatinine almost 5 on admission downtrending to 1.42 today close to baseline of 1.3  For questions or updates, please contact [facility] Please consult [email or web address] for contact info under   Signed, Al Decant, MD  07/21/2019, 9:57 AM   .  I have personally seen and examined this patient. I agree with the assessment and plan as outlined above.  He is stable this am. He is awaiting CABG tomorrow. Will continue IV heparin today. Sinus this am. Will continue loading with IV amiodarone. Pt's wife updated on the phone with plans for CABG tomorrow.   Lauree Chandler 07/21/2019 10:36 AM

## 2019-07-21 NOTE — Progress Notes (Signed)
[place] for heparin Indication: ACS/STEMI  Allergies  Allergen Reactions  . Plavix [Clopidogrel Bisulfate] Itching and Rash    Patient Measurements: Height: 5\' 3"  (160 cm) Weight: 180 lb 12.4 oz (82 kg) IBW/kg (Calculated) : 56.9 Heparin Dosing Weight: 73.9kg  Vital Signs: Temp: 98.8 F (37.1 C) (09/29 0738) Temp Source: Oral (09/29 0738) BP: 125/70 (09/29 1200) Pulse Rate: 86 (09/29 1200)  Labs: Recent Labs    07/18/19 2325  07/19/19 0410 07/19/19 0900  07/20/19 0221  07/20/19 0845 07/20/19 1442 07/21/19 0223 07/21/19 1226  HGB  --    < > 10.4*  --   --  9.9*  --   --   --  10.3*  --   HCT  --   --  29.6*  --   --  28.9*  --   --   --  29.4*  --   PLT  --   --  197  --   --  182  --   --   --  186  --   HEPARINUNFRC  --   --  0.32  --    < >  --    < >  --  0.33 0.29* 0.40  CREATININE  --   --  1.76*  --   --  1.58*  --   --   --  1.42*  --   TROPONINIHS 3,206*  --   --  2,175*  --   --   --  941*  --   --   --    < > = values in this interval not displayed.    Estimated Creatinine Clearance: 40.6 mL/min (A) (by C-G formula based on SCr of 1.42 mg/dL (H)).   Assessment: 78 year old male presenting with sepsis, now with concern for PE vs ACS. Patient was taken to an urgent LHC 9/27 due to EKG changes overnight and rising troponins. Found to have total occlusion of RCA with collaterals from left circ. LAD 50-90%. CABG planned for 07/22/19  9/29 1300 HL 0.40 on 1600 units/hour will confirm therapeutic levels.  No bleeding, issues with line or lab draw reported.   Goal of Therapy:  Heparin level 0.3-0.7 units/ml Monitor platelets by anticoagulation protocol: Yes   Plan:  Continue heparin drip to 1600 units/hour 6-hour confirmatory HL Daily heparin level, CBC     Thank you for allowing pharmacy to participate in this patient's care.  Chester Romero L. Devin Going, PharmD, [place] PGY1 Pharmacy Resident 07/21/19      1:26  PM  Please check AMION for all [place] phone numbers After 10:00 PM, call the [place] [phone]

## 2019-07-21 NOTE — Progress Notes (Signed)
Pre op vascular       has been completed. Preliminary results can be found under CV proc through chart review. Raeonna Milo, BS, RDMS, RVT   

## 2019-07-21 NOTE — Progress Notes (Signed)
[place] for heparin Indication: ACS/STEMI  Allergies  Allergen Reactions  . Plavix [Clopidogrel Bisulfate] Itching and Rash    Patient Measurements: Height: 5\' 3"  (160 cm) Weight: 180 lb 12.4 oz (82 kg) IBW/kg (Calculated) : 56.9 Heparin Dosing Weight: 73.9kg  Vital Signs: Temp: 98 F (36.7 C) (09/29 1933) Temp Source: Axillary (09/29 1933) BP: 102/70 (09/29 2100) Pulse Rate: 71 (09/29 2100)  Labs: Recent Labs    07/18/19 2325  07/19/19 0410 07/19/19 0900  07/20/19 0221  07/20/19 0845  07/21/19 0223 07/21/19 1226 07/21/19 2059  HGB  --    < > 10.4*  --   --  9.9*  --   --   --  10.3*  --   --   HCT  --   --  29.6*  --   --  28.9*  --   --   --  29.4*  --   --   PLT  --   --  197  --   --  182  --   --   --  186  --   --   HEPARINUNFRC  --   --  0.32  --    < >  --    < >  --    < > 0.29* 0.40 0.28*  CREATININE  --   --  1.76*  --   --  1.58*  --   --   --  1.42*  --   --   TROPONINIHS 3,206*  --   --  2,175*  --   --   --  941*  --   --   --   --    < > = values in this interval not displayed.    Estimated Creatinine Clearance: 40.6 mL/min (A) (by C-G formula based on SCr of 1.42 mg/dL (H)).   Assessment: 78 year old male presenting with sepsis, now with concern for PE vs ACS. Patient was taken to an urgent LHC 9/27 due to EKG changes overnight and rising troponins. Found to have total occlusion of RCA with collaterals from left circ. LAD 50-90%. CABG planned for 07/22/19  9/29 1300 HL 0.40 on heparin drip rate 1600 units/hour, pm HL fell slightly 0.28 on same drip rate.  Plan for OR in am so no changes  No bleeding, issues with line or lab draw reported.   Goal of Therapy:  Heparin level 0.3-0.7 units/ml Monitor platelets by anticoagulation protocol: Yes   Plan:  Continue heparin drip to 1600 units/hour Daily heparin level, CBC    Bonnita Nasuti Pharm.D. CPP, BCPS Clinical Pharmacist [phone] 07/21/2019 10:08  PM

## 2019-07-21 NOTE — Progress Notes (Signed)
Played "preparing for heart surgery" education video for patient at 1300 this shift.

## 2019-07-21 NOTE — Progress Notes (Signed)
Physical Therapy Treatment and D/C Patient Details Name: Philip Hurst MRN: [id number] DOB: [birth year] Today's Date: 07/21/2019    History of Present Illness 78 year old male presenting with sepsis, now with concern for PE vs ACS. Patient was taken to an urgent LHC 9/27 due to EKG changes overnight and rising troponins. Found to have total occlusion of RCA with collaterals from left circ. LAD 50-90%. CABG planned for 07/22/19    PT Comments    Pt admitted with above diagnosis. Pt was able to ambulate with min guard assist in hallway and incr distance today. HAs difficulty using LEs to stand but did practice in prep for CABG tomorrow.  Please reorder PT after surgery.      Follow Up Recommendations  Home health PT;Supervision for mobility/OOB     Equipment Recommendations  None recommended by PT    Recommendations for Other Services       Precautions / Restrictions Precautions Precautions: Fall Restrictions Weight Bearing Restrictions: No    Mobility  Bed Mobility Overal bed mobility: Needs Assistance Bed Mobility: Rolling;Sidelying to Sit Rolling: Min guard Sidelying to sit: Min assist       General bed mobility comments: cues for technique, assist to lift trunk  Transfers Overall transfer level: Needs assistance Equipment used: Rolling walker (2 wheeled) Transfers: Sit to/from Stand Sit to Stand: Min assist;Min guard         General transfer comment: practiced sit to stand with hands on knees to prepare for CABG.  Pt has difficulty with this.  Needs cues and steadying assist  Ambulation/Gait Ambulation/Gait assistance: Min assist Gait Distance (Feet): 80 Feet Assistive device: Rolling walker (2 wheeled) Gait Pattern/deviations: Step-through pattern;Decreased stride length   Gait velocity interpretation: <1.31 ft/sec, indicative of household ambulator General Gait Details: slow pace, flexed posture with lines O2, etc. SpO2 on 4LO2 91% with ambulation, HR max  118.  Pt on2LO2 at rest   Stairs             Wheelchair Mobility    Modified Rankin (Stroke Patients Only)       Balance Overall balance assessment: Needs assistance Sitting-balance support: No upper extremity supported;Feet supported Sitting balance-Leahy Scale: Good     Standing balance support: Bilateral upper extremity supported;During functional activity Standing balance-Leahy Scale: Poor Standing balance comment: right now reliant on UE support for balance                            Cognition Arousal/Alertness: Awake/alert Behavior During Therapy: WFL for tasks assessed/performed Overall Cognitive Status: Within Functional Limits for tasks assessed                                        Exercises      General Comments General comments (skin integrity, edema, etc.): continued education on sternal precautions      Pertinent Vitals/Pain Pain Assessment: Faces Faces Pain Scale: Hurts little more Pain Location: low back pain Pain Descriptors / Indicators: Aching;Discomfort;Grimacing Pain Intervention(s): Limited activity within patient's tolerance;Monitored during session;Repositioned    Home Living                      Prior Function            PT Goals (current goals can now be found in the care plan section) Acute Rehab PT  Goals Patient Stated Goal: to return to independent Potential to Achieve Goals: Good Progress towards PT goals: Progressing toward goals    Frequency    Min 3X/week      PT Plan Current plan remains appropriate    Co-evaluation              AM-PAC PT "6 Clicks" Mobility   Outcome Measure  Help needed turning from your back to your side while in a flat bed without using bedrails?: A Little Help needed moving from lying on your back to sitting on the side of a flat bed without using bedrails?: A Little Help needed moving to and from a bed to a chair (including a wheelchair)?:  A Little Help needed standing up from a chair using your arms (e.g., wheelchair or bedside chair)?: A Little Help needed to walk in hospital room?: A Little Help needed climbing 3-5 steps with a railing? : A Little 6 Click Score: 18    End of Session Equipment Utilized During Treatment: Oxygen;Gait belt Activity Tolerance: Patient limited by fatigue Patient left: in chair;with call bell/phone within reach Nurse Communication: Mobility status PT Visit Diagnosis: Other abnormalities of gait and mobility (R26.89);Muscle weakness (generalized) (M62.81)     Time: [id number] PT Time Calculation (min) (ACUTE ONLY): 18 min  Charges:  $Gait Training: 8-22 mins                     [place] Pager:  [phone]  Office:  [place] 07/21/2019, 11:57 AM

## 2019-07-21 NOTE — Progress Notes (Signed)
[place] for heparin Indication: ACS/STEMI  Allergies  Allergen Reactions  . Plavix [Clopidogrel Bisulfate] Itching and Rash    Patient Measurements: Height: 5\' 3"  (160 cm) Weight: 180 lb 12.4 oz (82 kg) IBW/kg (Calculated) : 56.9 Heparin Dosing Weight: 73.9kg  Vital Signs: Temp: 98.7 F (37.1 C) (09/29 0400) Temp Source: Oral (09/29 0400) BP: 132/80 (09/29 0600) Pulse Rate: 77 (09/29 0600)  Labs: Recent Labs    07/18/19 2325  07/19/19 0410 07/19/19 0900  07/20/19 0221 07/20/19 0838 07/20/19 0845 07/20/19 1442 07/21/19 0223  HGB  --    < > 10.4*  --   --  9.9*  --   --   --  10.3*  HCT  --   --  29.6*  --   --  28.9*  --   --   --  29.4*  PLT  --   --  197  --   --  182  --   --   --  186  HEPARINUNFRC  --   --  0.32  --    < >  --  0.37  --  0.33 0.29*  CREATININE  --   --  1.76*  --   --  1.58*  --   --   --  1.42*  TROPONINIHS 3,206*  --   --  2,175*  --   --   --  941*  --   --    < > = values in this interval not displayed.    Estimated Creatinine Clearance: 40.6 mL/min (A) (by C-G formula based on SCr of 1.42 mg/dL (H)).   Assessment: 78 year old male presenting with sepsis, now with concern for PE vs ACS. Patient was taken to an urgent LHC 9/27 due to EKG changes overnight and rising troponins. Found to have total occlusion of RCA with collaterals from left circ. LAD 50-90%. CABG planned for 07/22/19  Heparin level on low end of goal as of 9/28, increased to remain therapeutic. HL 9/29 0230 not therapeutic at 0.29. No bleeding, issues with line or lab draw reported.   Goal of Therapy:  Heparin level 0.3-0.7 units/ml Monitor platelets by anticoagulation protocol: Yes   Plan:  Increase heparin drip to 1600 units/hour 6-hour Heparin Daily heparin level, CBC     Thank you for allowing pharmacy to participate in this patient's care.  Madox Corkins L. Devin Going, PharmD, [place] PGY1 Pharmacy Resident 07/21/19      6:51  AM  Please check AMION for all [place] phone numbers After 10:00 PM, call the [facility] [phone]

## 2019-07-21 NOTE — Progress Notes (Addendum)
Family Medicine Teaching Service Daily Progress Note Intern Pager: [phone]  Patient name: Philip Hurst Medical record number: [id number] Date of birth: 1941/01/12 Age: 78 y.o. Gender: male  Primary Care Provider: Leonard Downing, MD Consultants: Cardiology Code Status:   Code Status: Full Code   Pt Overview and Major Events to Date:  07/16/19: Patient admitted to progressive unit 07/17/19:  Trop 1370, Heparin, Cardiology consulted, ECHO:  EF 55-60% with mildly increased LVH 07/18/19: CTA negative for PE  Assessment and Plan: Philip Hurst is a 78 y.o. male presenting with episode of confusion earlier this morning and few month h/o "not feeling right." PMH is significant for HTN, CAD, HLD, HTN, PAD, former smoker (currently dips)  NSTEMI Troponin peak of 3396 now downtrending at 941. ST elevations noted in V2 through V4 on EKGs dated on 9/26, 9/27, 9/28. Echo from 9/27 shows reduced EF of 25% down from 55% on 9/25. Taken for left heart cath on 9/27 which showed acute systolic heart failure, CAD of the LAD 70 to 90% and total occlusion of the RCA. CVTS has been consulted and recommends coronary artery bypass surgery once medically stable. -Cardiology: Cont ASA, Heparin gtt, Nitro gtt, and BB -CVTS: CABG tomorrow on 9/30 - Cont Heparin drip - Amiodarone gtt - Cont Nitroglycerin drip - Cont Atorvastatin 80 mg daily - Carvedilol 3.125mg  BID - Cont ASA 81mg   Community-acquired pneumonia-improving Remains afebrile for the past 24 hours, with stable vitals and stable WBC that remains slightly elevated at 11.7 this am.  Now on 2L [place], not on home oxygen. - consider HFNC then BiPAP if oxygen sat drops - S/p CTX and azithro (9/24- 9/26) - Continue Cefdinir (9/26-9/30) for total of 7 days of antibiotic treatment  Acute on Chronic Kidney Disease Stage III-improving Cr 1.98>1.65>1.76>1.58>1.42 Baseline (~1.3, last record in 2016). May be due to hypervolemia given worsening pulmonary edema.  Almost back to baseline. - Avoid nephrotoxic agents - daily BMP  HTN: Blood pressures well controlled overnight. SBP of 97-150 and DBP of 58-87 over past 24 hours. Most recent 147/87 this am. 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  - Noroc10-325mg  q4h PRN - Oxycontin 10mg  q12hr - gabapentin 100mg  QHS - Hold ibuprofen in setting of AKI and due to CAD - Miralax and senna PRN  PAD -ASA as above -Atorvastatin as above  HLD -Atorvastatin as above  Allergies - continue home loratadine  FEN/GI: Heart healthy Prophylaxis: Heparin dtt  Disposition: Planning for CABG 9/30  Subjective:  Improved SOB this am with no difficulty breathing on 2L [place]. No reported chest pain or pressure this am. He states he is ready for surgery tomorrow to "get this over with". Overall, he appears to be in good spirits.  Objective: Temp:  [97.6 F (36.4 C)-98.8 F (37.1 C)] 98.8 F (37.1 C) (09/29 0738) Pulse Rate:  [71-98] 84 (09/29 0700) Resp:  [18-26] 20 (09/29 0700) BP: (97-150)/(58-87) 147/87 (09/29 0700) SpO2:  [95 %-99 %] 97 % (09/29 0700)  Physical Exam: Gen: Alert and Oriented x 3, NAD HEENT: Normocephalic, atraumatic CV: RRR, no murmurs, normal S1, S2 split Resp: CTAB, no wheezing, rales, or rhonchi, comfortable work of breathing Abd: non-distended, non-tender, soft, +bs in all four quadrants Ext: no clubbing, cyanosis, or edema Skin: warm, dry, intact, no rashes  Laboratory:  Recent Labs  Lab 07/19/19 0410 07/20/19 0221 07/21/19 0223  WBC 15.1* 11.8* 11.7*  HGB 10.4* 9.9* 10.3*  HCT 29.6* 28.9* 29.4*  PLT 197 182 186   Recent Labs  Lab 07/16/19 0909  07/19/19 0410 07/20/19 0221 07/21/19 0223  NA 135   < > 142 140 140  K 4.0   < > 4.0 3.5 3.6  CL 104   < > 115* 109 109  CO2 14*   < >  15* 18* 21*  BUN 93*   < > 38* 44* 44*  CREATININE 4.89*   < > 1.76* 1.58* 1.42*  CALCIUM 7.5*   < > 8.2* 8.2* 8.1*  PROT 5.7*  --   --   --   --   BILITOT 0.1*  --   --   --   --   ALKPHOS 92  --   --   --   --   ALT 22  --   --   --   --   AST 25  --   --   --   --   GLUCOSE 103*   < > 148* 143* 127*   < > = values in this interval not displayed.     Imaging/Diagnostic Tests:  9/27 - Cardiac Cath  Acute systolic heart failure with precipitous drop in LVEF with anteroapical wall motion abnormality likely related to acute ischemia.  Cannot totally exclude the possibility of a stress cardiomyopathy component.  LVEDP is 20 mmHg.  Severe coronary artery disease with segmental 70 to 90% proximal to mid LAD stenosis.  60% second obtuse marginal.  The first marginal is small.  Total occlusion, flush, RCA with large collateral bed from left circumflex to distal left ventricular branch.  9/27 - Echo Left Ventricle: Left ventricular ejection fraction, by visual estimation, is 25%. The left ventricle has severely decreased function. There is no left ventricular hypertrophy. Spectral Doppler shows Left ventricular diastolic Doppler parameters are indeterminate pattern of LV diastolic filling. The anteroseptal wall and apex are akinetic.No evidence of mitral valve stenosis by observation. No evidence of mitral valve regurgitation. Aortic valve regurgitation was not visualized by color flow Doppler. Mild aortic valve sclerosis is present, with no evidence of aortic valve stenosis.  9/27 - CXR IMPRESSION:Stable bilateral lung opacities as described above.  Nuala Alpha, DO 07/21/2019, 8:29 AM PGY-3, [place] Intern pager: [phone], text pages welcome

## 2019-07-21 NOTE — Progress Notes (Signed)
      [address]       ,[place] [address]             [phone]                 2 Days Post-Op Procedure(s) (LRB): LEFT HEART CATH AND CORONARY ANGIOGRAPHY (N/A)   Events: No events. Feels better today _______________________________________________________________ Vitals: BP (!) 147/87   Pulse 84   Temp 98.8 F (37.1 C) (Oral)   Resp 20   Ht 5\' 3"  (1.6 m)   Wt 82 kg   SpO2 97%   BMI 32.02 kg/m   - Neuro: awake  - Cardiovascular:  Rate controlled  Drips: nitro, amio and heparin.     - Pulm: on [place].  Coarse B    ABG No results found for: PHART, PCO2ART, PO2ART, HCO3, TCO2, ACIDBASEDEF, O2SAT  - Abd: soft  .Intake/Output      09/28 0701 - 09/29 0700 09/29 0701 - 09/30 0700   P.O.     I.V. (mL/kg) 870.6 (10.6)    Total Intake(mL/kg) 870.6 (10.6)    Urine (mL/kg/hr) 1200 (0.6)    Total Output 1200    Net -329.4         Urine Occurrence 1 x       _______________________________________________________________ Labs: CBC Latest Ref Rng & Units 07/21/2019 07/20/2019 07/19/2019  WBC 4.0 - 10.5 K/uL 11.7(H) 11.8(H) 15.1(H)  Hemoglobin 13.0 - 17.0 g/dL 10.3(L) 9.9(L) 10.4(L)  Hematocrit 39.0 - 52.0 % 29.4(L) 28.9(L) 29.6(L)  Platelets 150 - 400 K/uL 186 182 197   CMP Latest Ref Rng & Units 07/21/2019 07/20/2019 07/19/2019  Glucose 70 - 99 mg/dL 127(H) 143(H) 148(H)  BUN 8 - 23 mg/dL 44(H) 44(H) 38(H)  Creatinine 0.61 - 1.24 mg/dL 1.42(H) 1.58(H) 1.76(H)  Sodium 135 - 145 mmol/L 140 140 142  Potassium 3.5 - 5.1 mmol/L 3.6 3.5 4.0  Chloride 98 - 111 mmol/L 109 109 115(H)  CO2 22 - 32 mmol/L 21(L) 18(L) 15(L)  Calcium 8.9 - 10.3 mg/dL 8.1(L) 8.2(L) 8.2(L)  Total Protein 6.5 - 8.1 g/dL - - -  Total Bilirubin 0.3 - 1.2 mg/dL - - -  Alkaline Phos 38 - 126 U/L - - -  AST 15 - 41 U/L - - -  ALT 0 - 44 U/L - - -    CXR: Bilateral infiltrates  _______________________________________________________________  Assessment and Plan: NSTEMI, EF  of 25%.  pneumonia  Neuro: stable CV: on hep gtt.  Amio, aspirin, statin, BB.  Will plan for surgery on 9/30.   Pulm: continue abx, and pulm toilet.   Renal: creat downtrending.  Continue to monitor  Dispo: continue ICU care.  OR tomorrow.  Will also perform atriclip  Melodie Bouillon, MD 07/21/2019 7:45 AM

## 2019-07-22 ENCOUNTER — Inpatient Hospital Stay (HOSPITAL_COMMUNITY): Payer: Medicare Other

## 2019-07-22 ENCOUNTER — Inpatient Hospital Stay (HOSPITAL_COMMUNITY): Payer: Medicare Other | Admitting: Certified Registered Nurse Anesthetist

## 2019-07-22 ENCOUNTER — Encounter (HOSPITAL_COMMUNITY): Payer: Self-pay | Admitting: Certified Registered Nurse Anesthetist

## 2019-07-22 ENCOUNTER — Inpatient Hospital Stay (HOSPITAL_COMMUNITY)
Admission: EM | Disposition: A | Discharge status: Home Health | Payer: Self-pay | Source: Home / Self Care | Attending: Thoracic Surgery (Cardiothoracic Vascular Surgery)

## 2019-07-22 DIAGNOSIS — I25110 Atherosclerotic heart disease of native coronary artery with unstable angina pectoris: Secondary | ICD-10-CM

## 2019-07-22 DIAGNOSIS — I2510 Atherosclerotic heart disease of native coronary artery without angina pectoris: Secondary | ICD-10-CM | POA: Diagnosis present

## 2019-07-22 HISTORY — PX: CORONARY ARTERY BYPASS GRAFT: SHX141

## 2019-07-22 LAB — BASIC METABOLIC PANEL
Anion gap: 8 (ref 5–15)
BUN: 25 mg/dL — ABNORMAL HIGH (ref 8–23)
CO2: 22 mmol/L (ref 22–32)
Calcium: 7.7 mg/dL — ABNORMAL LOW (ref 8.9–10.3)
Chloride: 108 mmol/L (ref 98–111)
Creatinine, Ser: 1.03 mg/dL (ref 0.61–1.24)
GFR calc Af Amer: >60 mL/min (ref >60)
GFR calc non Af Amer: >60 mL/min (ref >60)
Glucose, Bld: 136 mg/dL — ABNORMAL HIGH (ref 70–99)
Potassium: 4.4 mmol/L (ref 3.5–5.1)
Sodium: 138 mmol/L (ref 135–145)

## 2019-07-22 LAB — CBC
HCT: 29.8 % — ABNORMAL LOW (ref 39.0–52.0)
HCT: 30.2 % — ABNORMAL LOW (ref 39.0–52.0)
HCT: 34.6 % — ABNORMAL LOW (ref 39.0–52.0)
Hemoglobin: 10.1 g/dL — ABNORMAL LOW (ref 13.0–17.0)
Hemoglobin: 9.9 g/dL — ABNORMAL LOW (ref 13.0–17.0)
Hemoglobin: 11.4 g/dL — ABNORMAL LOW (ref 13.0–17.0)
MCH: 32 pg (ref 26.0–34.0)
MCH: 31.4 pg (ref 26.0–34.0)
MCH: 31.1 pg (ref 26.0–34.0)
MCHC: 33.9 g/dL (ref 30.0–36.0)
MCHC: 32.8 g/dL (ref 30.0–36.0)
MCHC: 32.9 g/dL (ref 30.0–36.0)
MCV: 94.3 fL (ref 80.0–100.0)
MCV: 95.9 fL (ref 80.0–100.0)
MCV: 94.3 fL (ref 80.0–100.0)
Platelets: 183 10*3/uL (ref 150–400)
Platelets: 126 10*3/uL — ABNORMAL LOW (ref 150–400)
Platelets: 170 10*3/uL (ref 150–400)
RBC: 3.16 MIL/uL — ABNORMAL LOW (ref 4.22–5.81)
RBC: 3.15 MIL/uL — ABNORMAL LOW (ref 4.22–5.81)
RBC: 3.67 MIL/uL — ABNORMAL LOW (ref 4.22–5.81)
RDW: 14.6 % (ref 11.5–15.5)
RDW: 14.4 % (ref 11.5–15.5)
RDW: 14.7 % (ref 11.5–15.5)
WBC: 8.3 10*3/uL (ref 4.0–10.5)
WBC: 9.6 10*3/uL (ref 4.0–10.5)
WBC: 10.4 10*3/uL (ref 4.0–10.5)
nRBC: 0 % (ref 0.0–0.2)
nRBC: 0 % (ref 0.0–0.2)
nRBC: 0 % (ref 0.0–0.2)

## 2019-07-22 LAB — POCT I-STAT 7, (LYTES, BLD GAS, ICA,H+H)
Acid-base deficit: 6 mmol/L — ABNORMAL HIGH (ref 0.0–2.0)
Acid-base deficit: 5 mmol/L — ABNORMAL HIGH (ref 0.0–2.0)
Acid-base deficit: 3 mmol/L — ABNORMAL HIGH (ref 0.0–2.0)
Bicarbonate: 21.2 mmol/L (ref 20.0–28.0)
Bicarbonate: 20.3 mmol/L (ref 20.0–28.0)
Bicarbonate: 20.2 mmol/L (ref 20.0–28.0)
Calcium, Ion: 1.21 mmol/L (ref 1.15–1.40)
Calcium, Ion: 1.19 mmol/L (ref 1.15–1.40)
Calcium, Ion: 1.17 mmol/L (ref 1.15–1.40)
HCT: 30 % — ABNORMAL LOW (ref 39.0–52.0)
HCT: 33 % — ABNORMAL LOW (ref 39.0–52.0)
HCT: 31 % — ABNORMAL LOW (ref 39.0–52.0)
Hemoglobin: 10.2 g/dL — ABNORMAL LOW (ref 13.0–17.0)
Hemoglobin: 11.2 g/dL — ABNORMAL LOW (ref 13.0–17.0)
Hemoglobin: 10.5 g/dL — ABNORMAL LOW (ref 13.0–17.0)
O2 Saturation: 96 %
O2 Saturation: 87 %
O2 Saturation: 84 %
Patient temperature: 35.2
Patient temperature: 36.9
Patient temperature: 37.6
Potassium: 3.7 mmol/L (ref 3.5–5.1)
Potassium: 4.4 mmol/L (ref 3.5–5.1)
Potassium: 4 mmol/L (ref 3.5–5.1)
Sodium: 142 mmol/L (ref 135–145)
Sodium: 140 mmol/L (ref 135–145)
Sodium: 142 mmol/L (ref 135–145)
TCO2: 23 mmol/L (ref 22–32)
TCO2: 22 mmol/L (ref 22–32)
TCO2: 21 mmol/L — ABNORMAL LOW (ref 22–32)
pCO2 arterial: 45 mmHg (ref 32.0–48.0)
pCO2 arterial: 39.2 mmHg (ref 32.0–48.0)
pCO2 arterial: 31.3 mmHg — ABNORMAL LOW (ref 32.0–48.0)
pH, Arterial: 7.272 — ABNORMAL LOW (ref 7.350–7.450)
pH, Arterial: 7.322 — ABNORMAL LOW (ref 7.350–7.450)
pH, Arterial: 7.421 (ref 7.350–7.450)
pO2, Arterial: 89 mmHg (ref 83.0–108.0)
pO2, Arterial: 57 mmHg — ABNORMAL LOW (ref 83.0–108.0)
pO2, Arterial: 49 mmHg — ABNORMAL LOW (ref 83.0–108.0)

## 2019-07-22 LAB — PROTIME-INR
INR: 1.6 — ABNORMAL HIGH (ref 0.8–1.2)
Prothrombin Time: 18.9 seconds — ABNORMAL HIGH (ref 11.4–15.2)

## 2019-07-22 LAB — POCT I-STAT, CHEM 8
BUN: 27 mg/dL — ABNORMAL HIGH (ref 8–23)
BUN: 27 mg/dL — ABNORMAL HIGH (ref 8–23)
BUN: 28 mg/dL — ABNORMAL HIGH (ref 8–23)
BUN: 26 mg/dL — ABNORMAL HIGH (ref 8–23)
Calcium, Ion: 1.21 mmol/L (ref 1.15–1.40)
Calcium, Ion: 1.18 mmol/L (ref 1.15–1.40)
Calcium, Ion: 1.22 mmol/L (ref 1.15–1.40)
Calcium, Ion: 1.22 mmol/L (ref 1.15–1.40)
Chloride: 107 mmol/L (ref 98–111)
Chloride: 107 mmol/L (ref 98–111)
Chloride: 108 mmol/L (ref 98–111)
Chloride: 106 mmol/L (ref 98–111)
Creatinine, Ser: 0.9 mg/dL (ref 0.61–1.24)
Creatinine, Ser: 1 mg/dL (ref 0.61–1.24)
Creatinine, Ser: 1 mg/dL (ref 0.61–1.24)
Creatinine, Ser: 0.9 mg/dL (ref 0.61–1.24)
Glucose, Bld: 102 mg/dL — ABNORMAL HIGH (ref 70–99)
Glucose, Bld: 125 mg/dL — ABNORMAL HIGH (ref 70–99)
Glucose, Bld: 178 mg/dL — ABNORMAL HIGH (ref 70–99)
Glucose, Bld: 140 mg/dL — ABNORMAL HIGH (ref 70–99)
HCT: 25 % — ABNORMAL LOW (ref 39.0–52.0)
HCT: 22 % — ABNORMAL LOW (ref 39.0–52.0)
HCT: 24 % — ABNORMAL LOW (ref 39.0–52.0)
HCT: 21 % — ABNORMAL LOW (ref 39.0–52.0)
Hemoglobin: 8.5 g/dL — ABNORMAL LOW (ref 13.0–17.0)
Hemoglobin: 7.5 g/dL — ABNORMAL LOW (ref 13.0–17.0)
Hemoglobin: 8.2 g/dL — ABNORMAL LOW (ref 13.0–17.0)
Hemoglobin: 7.1 g/dL — ABNORMAL LOW (ref 13.0–17.0)
Potassium: 3.6 mmol/L (ref 3.5–5.1)
Potassium: 3.8 mmol/L (ref 3.5–5.1)
Potassium: 4.1 mmol/L (ref 3.5–5.1)
Potassium: 3.5 mmol/L (ref 3.5–5.1)
Sodium: 141 mmol/L (ref 135–145)
Sodium: 141 mmol/L (ref 135–145)
Sodium: 140 mmol/L (ref 135–145)
Sodium: 142 mmol/L (ref 135–145)
TCO2: 24 mmol/L (ref 22–32)
TCO2: 26 mmol/L (ref 22–32)
TCO2: 23 mmol/L (ref 22–32)
TCO2: 23 mmol/L (ref 22–32)

## 2019-07-22 LAB — COMPREHENSIVE METABOLIC PANEL
ALT: 299 U/L — ABNORMAL HIGH (ref 0–44)
AST: 98 U/L — ABNORMAL HIGH (ref 15–41)
Albumin: 2.1 g/dL — ABNORMAL LOW (ref 3.5–5.0)
Alkaline Phosphatase: 69 U/L (ref 38–126)
Anion gap: 11 (ref 5–15)
BUN: 34 mg/dL — ABNORMAL HIGH (ref 8–23)
CO2: 23 mmol/L (ref 22–32)
Calcium: 8 mg/dL — ABNORMAL LOW (ref 8.9–10.3)
Chloride: 107 mmol/L (ref 98–111)
Creatinine, Ser: 1.29 mg/dL — ABNORMAL HIGH (ref 0.61–1.24)
GFR calc Af Amer: >60 mL/min (ref >60)
GFR calc non Af Amer: 53 mL/min — ABNORMAL LOW (ref >60)
Glucose, Bld: 110 mg/dL — ABNORMAL HIGH (ref 70–99)
Potassium: 3.6 mmol/L (ref 3.5–5.1)
Sodium: 141 mmol/L (ref 135–145)
Total Bilirubin: 0.3 mg/dL (ref 0.3–1.2)
Total Protein: 5.1 g/dL — ABNORMAL LOW (ref 6.5–8.1)

## 2019-07-22 LAB — HEPARIN LEVEL (UNFRACTIONATED): Heparin Unfractionated: 0.52 IU/mL (ref 0.30–0.70)

## 2019-07-22 LAB — GLUCOSE, CAPILLARY
Glucose-Capillary: 134 mg/dL — ABNORMAL HIGH (ref 70–99)
Glucose-Capillary: 111 mg/dL — ABNORMAL HIGH (ref 70–99)

## 2019-07-22 LAB — MAGNESIUM
Magnesium: 1.9 mg/dL (ref 1.7–2.4)
Magnesium: 2.5 mg/dL — ABNORMAL HIGH (ref 1.7–2.4)

## 2019-07-22 LAB — APTT
aPTT: 150 seconds — ABNORMAL HIGH (ref 24–36)
aPTT: 35 seconds (ref 24–36)

## 2019-07-22 LAB — PREPARE RBC (CROSSMATCH)

## 2019-07-22 SURGERY — OFF PUMP CORONARY ARTERY BYPASS GRAFTING (CABG)
Anesthesia: General | Site: Chest

## 2019-07-22 MED ORDER — ACETAMINOPHEN 160 MG/5ML PO SOLN: 650.0000 mg | Freq: Once | ORAL | Status: AC

## 2019-07-22 MED ORDER — MIDAZOLAM HCL 5 MG/5ML IJ SOLN
INTRAMUSCULAR | Status: DC | PRN
  Administered 2019-07-22 (×3): 1 mg via INTRAVENOUS
  Administered 2019-07-22: 2 mg via INTRAVENOUS

## 2019-07-22 MED ORDER — MIDAZOLAM HCL 2 MG/2ML IJ SOLN: 2.0000 mg | INTRAMUSCULAR | Status: DC | PRN

## 2019-07-22 MED ORDER — HEMOSTATIC AGENTS (NO CHARGE) OPTIME
TOPICAL | Status: DC | PRN
  Administered 2019-07-22: 3 via TOPICAL

## 2019-07-22 MED ORDER — ROCURONIUM BROMIDE 10 MG/ML (PF) SYRINGE
PREFILLED_SYRINGE | INTRAVENOUS | Status: AC
  Filled 2019-07-22: qty 10

## 2019-07-22 MED ORDER — VANCOMYCIN HCL IN DEXTROSE 1-5 GM/200ML-% IV SOLN
1000.0000 mg | Freq: Once | INTRAVENOUS | Status: AC
  Administered 2019-07-22: 1000 mg via INTRAVENOUS
  Filled 2019-07-22: qty 200

## 2019-07-22 MED ORDER — SODIUM CHLORIDE 0.9% IV SOLUTION: Freq: Once | INTRAVENOUS | Status: DC

## 2019-07-22 MED ORDER — ALBUMIN HUMAN 5 % IV SOLN
INTRAVENOUS | Status: DC | PRN
  Administered 2019-07-22 (×3): via INTRAVENOUS

## 2019-07-22 MED ORDER — FENTANYL CITRATE (PF) 250 MCG/5ML IJ SOLN
INTRAMUSCULAR | Status: DC | PRN
  Administered 2019-07-22: 250 ug via INTRAVENOUS
  Administered 2019-07-22: 200 ug via INTRAVENOUS
  Administered 2019-07-22: 50 ug via INTRAVENOUS
  Administered 2019-07-22: 200 ug via INTRAVENOUS
  Administered 2019-07-22: 50 ug via INTRAVENOUS

## 2019-07-22 MED ORDER — LIDOCAINE 2% (20 MG/ML) 5 ML SYRINGE
INTRAMUSCULAR | Status: DC | PRN
  Administered 2019-07-22: 100 mg via INTRAVENOUS

## 2019-07-22 MED ORDER — BISACODYL 5 MG PO TBEC
10.0000 mg | DELAYED_RELEASE_TABLET | Freq: Every day | ORAL | Status: DC
  Administered 2019-07-23 – 2019-07-25 (×3): 10 mg via ORAL
  Filled 2019-07-22 (×4): qty 2

## 2019-07-22 MED ORDER — METOPROLOL TARTRATE 25 MG/10 ML ORAL SUSPENSION: 12.5000 mg | Freq: Two times a day (BID) | ORAL | Status: DC

## 2019-07-22 MED ORDER — SODIUM CHLORIDE 0.9% FLUSH
10.0000 mL | Freq: Two times a day (BID) | INTRAVENOUS | Status: DC
  Administered 2019-07-23 – 2019-07-27 (×8): 10 mL

## 2019-07-22 MED ORDER — ACETAMINOPHEN 650 MG RE SUPP
650.0000 mg | Freq: Once | RECTAL | Status: AC
  Administered 2019-07-22: 12:00:00 650 mg via RECTAL

## 2019-07-22 MED ORDER — METOPROLOL TARTRATE 12.5 MG HALF TABLET: 12.5000 mg | ORAL_TABLET | Freq: Two times a day (BID) | ORAL | Status: DC

## 2019-07-22 MED ORDER — ASPIRIN EC 325 MG PO TBEC
325.0000 mg | DELAYED_RELEASE_TABLET | Freq: Every day | ORAL | Status: DC
  Administered 2019-07-23 – 2019-07-29 (×7): 325 mg via ORAL
  Filled 2019-07-22 (×7): qty 1

## 2019-07-22 MED ORDER — LACTATED RINGERS IV SOLN
INTRAVENOUS | Status: DC | PRN
  Administered 2019-07-22: 07:00:00 via INTRAVENOUS

## 2019-07-22 MED ORDER — HEPARIN SODIUM (PORCINE) 1000 UNIT/ML IJ SOLN
INTRAMUSCULAR | Status: AC
  Filled 2019-07-22: qty 1

## 2019-07-22 MED ORDER — DEXMEDETOMIDINE HCL IN NACL 200 MCG/50ML IV SOLN
0.0000 ug/kg/h | INTRAVENOUS | Status: DC
  Filled 2019-07-22: qty 50

## 2019-07-22 MED ORDER — INSULIN REGULAR(HUMAN) IN NACL 100-0.9 UT/100ML-% IV SOLN
INTRAVENOUS | Status: DC
  Administered 2019-07-22: 12:00:00 2.2 [IU]/h via INTRAVENOUS

## 2019-07-22 MED ORDER — METOPROLOL TARTRATE 5 MG/5ML IV SOLN
2.5000 mg | INTRAVENOUS | Status: DC | PRN
  Administered 2019-07-23 – 2019-07-24 (×2): 5 mg via INTRAVENOUS
  Administered 2019-07-24: 2.5 mg via INTRAVENOUS
  Administered 2019-07-25 – 2019-07-26 (×5): 5 mg via INTRAVENOUS
  Filled 2019-07-22 (×8): qty 5

## 2019-07-22 MED ORDER — ACETAMINOPHEN 160 MG/5ML PO SOLN: 1000.0000 mg | Freq: Four times a day (QID) | ORAL | Status: AC

## 2019-07-22 MED ORDER — OXYCODONE HCL 5 MG PO TABS
5.0000 mg | ORAL_TABLET | ORAL | Status: DC | PRN
  Administered 2019-07-23 – 2019-07-29 (×31): 5 mg via ORAL
  Filled 2019-07-22 (×31): qty 1

## 2019-07-22 MED ORDER — PHENYLEPHRINE 40 MCG/ML (10ML) SYRINGE FOR IV PUSH (FOR BLOOD PRESSURE SUPPORT)
PREFILLED_SYRINGE | INTRAVENOUS | Status: AC
  Filled 2019-07-22: qty 30

## 2019-07-22 MED ORDER — DEXTROSE 50 % IV SOLN
INTRAVENOUS | Status: AC
  Filled 2019-07-22: qty 50

## 2019-07-22 MED ORDER — MAGNESIUM SULFATE 4 GM/100ML IV SOLN
4.0000 g | Freq: Once | INTRAVENOUS | Status: AC
  Administered 2019-07-22: 4 g via INTRAVENOUS
  Filled 2019-07-22: qty 100

## 2019-07-22 MED ORDER — ROCURONIUM BROMIDE 10 MG/ML (PF) SYRINGE
PREFILLED_SYRINGE | INTRAVENOUS | Status: DC | PRN
  Administered 2019-07-22: 90 mg via INTRAVENOUS
  Administered 2019-07-22: 50 mg via INTRAVENOUS

## 2019-07-22 MED ORDER — POTASSIUM CHLORIDE 10 MEQ/50ML IV SOLN
10.0000 meq | INTRAVENOUS | Status: AC
  Administered 2019-07-22 (×3): 10 meq via INTRAVENOUS

## 2019-07-22 MED ORDER — ACETAMINOPHEN 500 MG PO TABS
1000.0000 mg | ORAL_TABLET | Freq: Four times a day (QID) | ORAL | Status: AC
  Administered 2019-07-23 – 2019-07-27 (×15): 1000 mg via ORAL
  Filled 2019-07-22 (×15): qty 2

## 2019-07-22 MED ORDER — PROPOFOL 10 MG/ML IV BOLUS
INTRAVENOUS | Status: AC
  Filled 2019-07-22: qty 20

## 2019-07-22 MED ORDER — NOREPINEPHRINE 4 MG/250ML-% IV SOLN
0.0000 ug/min | INTRAVENOUS | Status: DC
  Administered 2019-07-22: 12:00:00 2 ug/min via INTRAVENOUS

## 2019-07-22 MED ORDER — LACTATED RINGERS IV SOLN
INTRAVENOUS | Status: DC | PRN
  Administered 2019-07-22 (×2): via INTRAVENOUS

## 2019-07-22 MED ORDER — ETOMIDATE 2 MG/ML IV SOLN
INTRAVENOUS | Status: DC | PRN
  Administered 2019-07-22: 16 mg via INTRAVENOUS

## 2019-07-22 MED ORDER — MORPHINE SULFATE (PF) 2 MG/ML IV SOLN
1.0000 mg | INTRAVENOUS | Status: DC | PRN
  Administered 2019-07-22: 2 mg via INTRAVENOUS
  Administered 2019-07-22: 21:00:00 1 mg via INTRAVENOUS
  Administered 2019-07-23 – 2019-07-24 (×5): 2 mg via INTRAVENOUS
  Filled 2019-07-22 (×7): qty 1

## 2019-07-22 MED ORDER — SUCCINYLCHOLINE CHLORIDE 200 MG/10ML IV SOSY
PREFILLED_SYRINGE | INTRAVENOUS | Status: AC
  Filled 2019-07-22: qty 10

## 2019-07-22 MED ORDER — SODIUM CHLORIDE 0.9 % IV SOLN
INTRAVENOUS | Status: DC | PRN
  Administered 2019-07-22: 10 ug/min via INTRAVENOUS

## 2019-07-22 MED ORDER — ASPIRIN 81 MG PO CHEW: 324.0000 mg | CHEWABLE_TABLET | Freq: Every day | ORAL | Status: DC

## 2019-07-22 MED ORDER — NICARDIPINE HCL IN NACL 20-0.86 MG/200ML-% IV SOLN
3.0000 mg/h | INTRAVENOUS | Status: DC
  Administered 2019-07-22 (×3): 5 mg/h via INTRAVENOUS
  Administered 2019-07-23 (×2): 2.5 mg/h via INTRAVENOUS
  Filled 2019-07-22 (×8): qty 200

## 2019-07-22 MED ORDER — LIDOCAINE 2% (20 MG/ML) 5 ML SYRINGE
INTRAMUSCULAR | Status: AC
  Filled 2019-07-22: qty 10

## 2019-07-22 MED ORDER — LACTATED RINGERS IV SOLN
INTRAVENOUS | Status: DC
  Administered 2019-07-23: 09:00:00 via INTRAVENOUS

## 2019-07-22 MED ORDER — HEPARIN SODIUM (PORCINE) 1000 UNIT/ML IJ SOLN
INTRAMUSCULAR | Status: DC | PRN
  Administered 2019-07-22: 5000 [IU] via INTRAVENOUS
  Administered 2019-07-22: 3000 [IU] via INTRAVENOUS
  Administered 2019-07-22: 12000 [IU] via INTRAVENOUS

## 2019-07-22 MED ORDER — INSULIN REGULAR BOLUS VIA INFUSION
0.0000 [IU] | Freq: Three times a day (TID) | INTRAVENOUS | Status: DC
  Filled 2019-07-22: qty 10

## 2019-07-22 MED ORDER — SODIUM CHLORIDE 0.9 % IV SOLN: 250.0000 mL | INTRAVENOUS | Status: DC

## 2019-07-22 MED ORDER — FENTANYL CITRATE (PF) 250 MCG/5ML IJ SOLN
INTRAMUSCULAR | Status: AC
  Filled 2019-07-22: qty 25

## 2019-07-22 MED ORDER — SODIUM CHLORIDE 0.9% FLUSH
3.0000 mL | Freq: Two times a day (BID) | INTRAVENOUS | Status: DC
  Administered 2019-07-23 – 2019-07-27 (×7): 3 mL via INTRAVENOUS

## 2019-07-22 MED ORDER — TRAMADOL HCL 50 MG PO TABS
50.0000 mg | ORAL_TABLET | ORAL | Status: DC | PRN
  Administered 2019-07-23 – 2019-07-29 (×12): 50 mg via ORAL
  Filled 2019-07-22 (×12): qty 1

## 2019-07-22 MED ORDER — TRANEXAMIC ACID 1000 MG/10ML IV SOLN
INTRAVENOUS | Status: DC | PRN
  Administered 2019-07-22: 08:00:00 1.5 mg/kg/h via INTRAVENOUS

## 2019-07-22 MED ORDER — SODIUM CHLORIDE 0.9 % IV SOLN
INTRAVENOUS | Status: DC | PRN
  Administered 2019-07-22: .5 ug/kg/h via INTRAVENOUS

## 2019-07-22 MED ORDER — PROTAMINE SULFATE 10 MG/ML IV SOLN
INTRAVENOUS | Status: DC | PRN
  Administered 2019-07-22: 40 mg via INTRAVENOUS
  Administered 2019-07-22: 30 mg via INTRAVENOUS
  Administered 2019-07-22: 40 mg via INTRAVENOUS
  Administered 2019-07-22: 20 mg via INTRAVENOUS
  Administered 2019-07-22: 40 mg via INTRAVENOUS
  Administered 2019-07-22: 30 mg via INTRAVENOUS

## 2019-07-22 MED ORDER — SODIUM CHLORIDE 0.9% FLUSH: 10.0000 mL | INTRAVENOUS | Status: DC | PRN

## 2019-07-22 MED ORDER — SODIUM CHLORIDE 0.45 % IV SOLN: INTRAVENOUS | Status: DC | PRN

## 2019-07-22 MED ORDER — LACTATED RINGERS IV SOLN: 500.0000 mL | Freq: Once | INTRAVENOUS | Status: DC | PRN

## 2019-07-22 MED ORDER — MIDAZOLAM HCL (PF) 10 MG/2ML IJ SOLN
INTRAMUSCULAR | Status: AC
  Filled 2019-07-22: qty 2

## 2019-07-22 MED ORDER — SODIUM CHLORIDE 0.9 % IV SOLN
INTRAVENOUS | Status: DC
  Administered 2019-07-22: 11:00:00 via INTRAVENOUS

## 2019-07-22 MED ORDER — EPHEDRINE 5 MG/ML INJ
INTRAVENOUS | Status: AC
  Filled 2019-07-22: qty 20

## 2019-07-22 MED ORDER — ORAL CARE MOUTH RINSE
15.0000 mL | Freq: Two times a day (BID) | OROMUCOSAL | Status: DC
  Administered 2019-07-22 – 2019-07-28 (×11): 15 mL via OROMUCOSAL

## 2019-07-22 MED ORDER — DOCUSATE SODIUM 100 MG PO CAPS
200.0000 mg | ORAL_CAPSULE | Freq: Every day | ORAL | Status: DC
  Administered 2019-07-23 – 2019-07-25 (×3): 200 mg via ORAL
  Filled 2019-07-22 (×4): qty 2

## 2019-07-22 MED ORDER — SODIUM CHLORIDE 0.9% FLUSH: 3.0000 mL | INTRAVENOUS | Status: DC | PRN

## 2019-07-22 MED ORDER — SODIUM CHLORIDE 0.9 % IV SOLN
INTRAVENOUS | Status: DC | PRN
  Administered 2019-07-22: 40 ug/min via INTRAVENOUS
  Administered 2019-07-22: 60 ug/min via INTRAVENOUS

## 2019-07-22 MED ORDER — BISACODYL 10 MG RE SUPP: 10.0000 mg | Freq: Every day | RECTAL | Status: DC

## 2019-07-22 MED ORDER — PROTAMINE SULFATE 10 MG/ML IV SOLN
INTRAVENOUS | Status: AC
  Filled 2019-07-22: qty 25

## 2019-07-22 MED ORDER — DOBUTAMINE IN D5W 4-5 MG/ML-% IV SOLN
2.5000 ug/kg/min | INTRAVENOUS | Status: DC
  Administered 2019-07-22: 2.5 ug/kg/min via INTRAVENOUS
  Filled 2019-07-22 (×2): qty 250

## 2019-07-22 MED ORDER — LACTATED RINGERS IV SOLN: INTRAVENOUS | Status: DC

## 2019-07-22 MED ORDER — HEPARIN SODIUM (PORCINE) 1000 UNIT/ML IJ SOLN
INTRAMUSCULAR | Status: AC
  Filled 2019-07-22: qty 2

## 2019-07-22 MED ORDER — CHLORHEXIDINE GLUCONATE 0.12% ORAL RINSE (MEDLINE KIT)
15.0000 mL | Freq: Two times a day (BID) | OROMUCOSAL | Status: DC
  Administered 2019-07-22: 21:00:00 15 mL via OROMUCOSAL

## 2019-07-22 MED ORDER — ONDANSETRON HCL 4 MG/2ML IJ SOLN
4.0000 mg | Freq: Four times a day (QID) | INTRAMUSCULAR | Status: DC | PRN
  Administered 2019-07-27 – 2019-07-29 (×4): 4 mg via INTRAVENOUS
  Filled 2019-07-22 (×3): qty 2

## 2019-07-22 MED ORDER — CHLORHEXIDINE GLUCONATE 0.12 % MT SOLN
15.0000 mL | OROMUCOSAL | Status: AC
  Administered 2019-07-22: 12:00:00 15 mL via OROMUCOSAL

## 2019-07-22 MED ORDER — FAMOTIDINE IN NACL 20-0.9 MG/50ML-% IV SOLN
20.0000 mg | Freq: Two times a day (BID) | INTRAVENOUS | Status: AC
  Administered 2019-07-22: 12:00:00 20 mg via INTRAVENOUS

## 2019-07-22 MED ORDER — PHENYLEPHRINE HCL (PRESSORS) 10 MG/ML IV SOLN
INTRAVENOUS | Status: DC | PRN
  Administered 2019-07-22: 120 ug via INTRAVENOUS

## 2019-07-22 MED ORDER — ORAL CARE MOUTH RINSE
15.0000 mL | OROMUCOSAL | Status: DC
  Administered 2019-07-22 (×3): 15 mL via OROMUCOSAL

## 2019-07-22 MED ORDER — PANTOPRAZOLE SODIUM 40 MG PO TBEC
40.0000 mg | DELAYED_RELEASE_TABLET | Freq: Every day | ORAL | Status: DC
  Administered 2019-07-24 – 2019-07-29 (×6): 40 mg via ORAL
  Filled 2019-07-22 (×6): qty 1

## 2019-07-22 SURGICAL SUPPLY — 97 items
ADH SKN CLS APL DERMABOND .7 (GAUZE/BANDAGES/DRESSINGS) ×2
BAG DECANTER FOR FLEXI CONT (MISCELLANEOUS) ×6 IMPLANT
BASKET HEART  (ORDER IN 25'S) (MISCELLANEOUS)
BASKET HEART (ORDER IN 25'S) (MISCELLANEOUS)
BASKET HEART (ORDER IN 25S) (MISCELLANEOUS) ×1 IMPLANT
BLADE CLIPPER SURG (BLADE) ×3 IMPLANT
BLADE STERNUM SYSTEM 6 (BLADE) ×3 IMPLANT
BLOWER MISTER CAL-MED (MISCELLANEOUS) ×3 IMPLANT
BNDG ELASTIC 4X5.8 VLCR STR LF (GAUZE/BANDAGES/DRESSINGS) ×3 IMPLANT
BNDG ELASTIC 6X5.8 VLCR STR LF (GAUZE/BANDAGES/DRESSINGS) ×3 IMPLANT
BNDG GAUZE ELAST 4 BULKY (GAUZE/BANDAGES/DRESSINGS) ×3 IMPLANT
CABLE SURGICAL S-101-97-12 (CABLE) ×2 IMPLANT
CANISTER SUCT 3000ML PPV (MISCELLANEOUS) ×3 IMPLANT
CANNULA EZ GLIDE 8.0 24FR (CANNULA) ×1 IMPLANT
CATH CPB KIT HENDRICKSON (MISCELLANEOUS) ×1 IMPLANT
CLIP RETRACTION 3.0MM CORONARY (MISCELLANEOUS) ×1 IMPLANT
CLIP VESOCCLUDE MED 24/CT (CLIP) IMPLANT
CLIP VESOCCLUDE SM WIDE 24/CT (CLIP) ×6 IMPLANT
CONN ST 1/2X1/2  BEN (MISCELLANEOUS) ×2
CONN ST 1/2X1/2 BEN (MISCELLANEOUS) ×1 IMPLANT
CONNECTOR BLAKE 2:1 CARIO BLK (MISCELLANEOUS) ×2 IMPLANT
COVER WAND RF STERILE (DRAPES) ×3 IMPLANT
DERMABOND ADVANCED (GAUZE/BANDAGES/DRESSINGS) ×4
DERMABOND ADVANCED .7 DNX12 (GAUZE/BANDAGES/DRESSINGS) IMPLANT
DRAIN CHANNEL 19F RND (DRAIN) IMPLANT
DRAPE CARDIOVASCULAR INCISE (DRAPES) ×3
DRAPE INCISE IOBAN 66X45 STRL (DRAPES) ×3 IMPLANT
DRAPE SLUSH/WARMER DISC (DRAPES) ×3 IMPLANT
DRAPE SRG 135X102X78XABS (DRAPES) ×1 IMPLANT
DRSG COVADERM 4X10 (GAUZE/BANDAGES/DRESSINGS) ×2 IMPLANT
DRSG COVADERM 4X14 (GAUZE/BANDAGES/DRESSINGS) ×3 IMPLANT
ELECT REM PT RETURN 9FT ADLT (ELECTROSURGICAL) ×6
ELECTRODE REM PT RTRN 9FT ADLT (ELECTROSURGICAL) ×2 IMPLANT
FELT TEFLON 1X6 (MISCELLANEOUS) ×6 IMPLANT
GAUZE SPONGE 4X4 12PLY STRL (GAUZE/BANDAGES/DRESSINGS) ×6 IMPLANT
GAUZE SPONGE 4X4 12PLY STRL LF (GAUZE/BANDAGES/DRESSINGS) ×4 IMPLANT
GLOVE BIO SURGEON STRL SZ7 (GLOVE) ×6 IMPLANT
GLOVE BIOGEL PI IND STRL 7.5 (GLOVE) ×2 IMPLANT
GLOVE BIOGEL PI INDICATOR 7.5 (GLOVE) ×4
GOWN STRL REUS W/ TWL LRG LVL3 (GOWN DISPOSABLE) ×4 IMPLANT
GOWN STRL REUS W/ TWL XL LVL3 (GOWN DISPOSABLE) ×2 IMPLANT
GOWN STRL REUS W/TWL LRG LVL3 (GOWN DISPOSABLE) ×12
GOWN STRL REUS W/TWL XL LVL3 (GOWN DISPOSABLE) ×6
HEMOSTAT POWDER SURGIFOAM 1G (HEMOSTASIS) ×9 IMPLANT
KIT BASIN OR (CUSTOM PROCEDURE TRAY) ×3 IMPLANT
KIT SUCTION CATH 14FR (SUCTIONS) ×5 IMPLANT
KIT TURNOVER KIT B (KITS) ×3 IMPLANT
KIT VASOVIEW HEMOPRO 2 VH 4000 (KITS) ×3 IMPLANT
LEAD PACING MYOCARDI (MISCELLANEOUS) ×1 IMPLANT
MARKER GRAFT CORONARY BYPASS (MISCELLANEOUS) ×7 IMPLANT
NS IRRIG 1000ML POUR BTL (IV SOLUTION) ×15 IMPLANT
OFFPUMP STABILIZER SUV (MISCELLANEOUS) ×3 IMPLANT
PACK E OPEN HEART (SUTURE) ×3 IMPLANT
PACK OPEN HEART (CUSTOM PROCEDURE TRAY) ×3 IMPLANT
PAD ARMBOARD 7.5X6 YLW CONV (MISCELLANEOUS) ×6 IMPLANT
PAD ELECT DEFIB RADIOL ZOLL (MISCELLANEOUS) ×3 IMPLANT
PENCIL BUTTON HOLSTER BLD 10FT (ELECTRODE) ×3 IMPLANT
POSITIONER ACROBAT-I OFFPUMP (MISCELLANEOUS) ×3 IMPLANT
POSITIONER HEAD DONUT 9IN (MISCELLANEOUS) ×3 IMPLANT
PUNCH AORTIC ROTATE 4.0MM (MISCELLANEOUS) ×2 IMPLANT
PUNCH AORTIC ROTATE 4.5MM 8IN (MISCELLANEOUS) IMPLANT
PUNCH AORTIC ROTATE 5MM 8IN (MISCELLANEOUS) IMPLANT
SHUNT FLO COIL 1.50MM (MISCELLANEOUS) ×2 IMPLANT
SLEEVE SURGEON STRL (DRAPES) ×2 IMPLANT
SPONGE LAP 18X18 RF (DISPOSABLE) IMPLANT
SPONGE LAP 4X18 RFD (DISPOSABLE) IMPLANT
SUT BONE WAX W31G (SUTURE) ×3 IMPLANT
SUT MNCRL AB 3-0 PS2 18 (SUTURE) ×6 IMPLANT
SUT MNCRL AB 4-0 PS2 18 (SUTURE) ×2 IMPLANT
SUT PDS AB 1 CTX 36 (SUTURE) ×6 IMPLANT
SUT PROLENE 2 0 SH DA (SUTURE) IMPLANT
SUT PROLENE 3 0 SH DA (SUTURE) ×3 IMPLANT
SUT PROLENE 3 0 SH1 36 (SUTURE) IMPLANT
SUT PROLENE 4 0 RB 1 (SUTURE) ×3
SUT PROLENE 4 0 SH DA (SUTURE) IMPLANT
SUT PROLENE 4-0 RB1 .5 CRCL 36 (SUTURE) IMPLANT
SUT PROLENE 5 0 C 1 36 (SUTURE) ×9 IMPLANT
SUT PROLENE 6 0 C 1 30 (SUTURE) IMPLANT
SUT PROLENE 8 0 BV175 6 (SUTURE) IMPLANT
SUT PROLENE BLUE 7 0 (SUTURE) ×3 IMPLANT
SUT PROLENE POLY MONO (SUTURE) IMPLANT
SUT STEEL 6MS V (SUTURE) ×6 IMPLANT
SUT VIC AB 2-0 CT1 27 (SUTURE) ×3
SUT VIC AB 2-0 CT1 TAPERPNT 27 (SUTURE) IMPLANT
SYSTEM HEARTSTRING SEAL 3.8 (VASCULAR PRODUCTS) ×1 IMPLANT
SYSTEM HEARTSTRING SEAL 3.8MM (VASCULAR PRODUCTS)
SYSTEM HEARTSTRING SEAL 4.3 (VASCULAR PRODUCTS) ×1 IMPLANT
SYSTEM HEARTSTRING SEAL 4.3MM (VASCULAR PRODUCTS)
SYSTEM SAHARA CHEST DRAIN ATS (WOUND CARE) ×3 IMPLANT
TAPE CLOTH SURG 4X10 WHT LF (GAUZE/BANDAGES/DRESSINGS) ×2 IMPLANT
TAPES RETRACTO (MISCELLANEOUS) ×4 IMPLANT
TOWEL GREEN STERILE (TOWEL DISPOSABLE) ×3 IMPLANT
TOWEL GREEN STERILE FF (TOWEL DISPOSABLE) ×3 IMPLANT
TRAY FOLEY SLVR 16FR TEMP STAT (SET/KITS/TRAYS/PACK) ×3 IMPLANT
TUBING LAP HI FLOW INSUFFLATIO (TUBING) ×3 IMPLANT
UNDERPAD 30X30 (UNDERPADS AND DIAPERS) ×3 IMPLANT
WATER STERILE IRR 1000ML POUR (IV SOLUTION) ×6 IMPLANT

## 2019-07-22 NOTE — Progress Notes (Signed)
Pt blood gas results given to MD [facility]. Pt unable to perform parameters adequately for extubation. Per MD give 1 amp of bicarb and try weaning protocol again in one hour.  RN will continue to monitor pt closely.

## 2019-07-22 NOTE — Brief Op Note (Addendum)
07/16/2019 - 07/22/2019  10:37 AM  PATIENT:  Philip Hurst  78 y.o. male  PRE-OPERATIVE DIAGNOSIS:  1. S/p STEMI 2. CAD  POST-OPERATIVE DIAGNOSIS:  1. S/p STEMI 2. CAD  PROCEDURE:  MEDIAN STERNOTOMY for OFF PUMP CORONARY ARTERY BYPASS GRAFTING (CABG) times two (LIMA to LAD and SVG to PDA) using left internal mammary artery and right thigh greater  saphenous vein  SURGEON:  Surgeon(s) and Role:  Lightfoot, Lucile Crater, MD - Primary  PHYSICIAN ASSISTANT: Lars Pinks PA-C  ASSISTANTS: Vernie Murders RNFA  ANESTHESIA:   general  EBL:  Per anesthesia and perfusion record  DRAINS: 2 Blake drains in the pleaural spaces  COUNTS CORRECT:  YES  DICTATION: .Dragon Dictation  PLAN OF CARE: Admit to inpatient   PATIENT DISPOSITION:  ICU - intubated and hemodynamically stable.   Delay start of Pharmacological VTE agent (>24hrs) due to surgical blood loss or risk of bleeding: yes  BASELINE WEIGHT: 82 kg

## 2019-07-22 NOTE — Progress Notes (Signed)
      [address]       [place], [address]             [phone]      S/p OPCAB  Intubated, waking up and following commands but not strong enough to do weaning parameters yet  BP (!) 98/56   Pulse 82   Temp 98.2 F (36.8 C)   Resp (!) 25   Ht 5\' 3"  (1.6 m)   Wt 82 kg   SpO2 91%   BMI 32.02 kg/m  33/25 CI= 2.8 K= 4.4 Hct=34  Doing well early postop  Steven C. Roxan Hockey, MD [facility] [phone]

## 2019-07-22 NOTE — Progress Notes (Signed)
audible cuff leak noted

## 2019-07-22 NOTE — Procedures (Signed)
Extubation Procedure Note  Patient Details:   Name: Philip Hurst DOB: 1941/09/19 MRN: [id number]   Airway Documentation:  Airway 8 mm (Active)  Secured at (cm) 22 cm 07/22/19 1933  Measured From Lips 07/22/19 1933  Secured Location Right 07/22/19 1933  Secured By Other (Comment) 07/22/19 1933  Site Condition Dry 07/22/19 1933   Vent end date: 07/22/19 Vent end time: 2159   Evaluation  O2 sats: stable throughout Complications: No apparent complications Patient did tolerate procedure well. Bilateral Breath Sounds: Clear, Diminished   Patient able to speak post-extubation   Yes   Extubation procedure clearly explained to the patient. Patient nodded his head for understanding. Patient was extubated to a venti-mask 55%. No complications noted.    Leigh Aurora, B.S, RRT, RCP 07/22/2019, 10:08 PM

## 2019-07-22 NOTE — Progress Notes (Signed)
  Echocardiogram Echocardiogram Transesophageal has been performed.  Darlina Sicilian M 07/22/2019, 8:11 AM

## 2019-07-22 NOTE — Anesthesia Procedure Notes (Signed)
Arterial Line Insertion Start/End9/30/2020 7:10 AM, 07/22/2019 7:17 AM Performed by: Veeda Virgo T, Immunologist, CRNA  Patient location: Pre-op. Preanesthetic checklist: patient identified, IV checked, site marked, risks and benefits discussed, surgical consent, monitors and equipment checked, pre-op evaluation and timeout performed Lidocaine 1% used for infiltration and patient sedated Left, radial was placed Catheter size: 20 G Hand hygiene performed  and maximum sterile barriers used   Attempts: 1 Procedure performed without using ultrasound guided technique. Following insertion, dressing applied and Biopatch. Post procedure assessment: normal  Patient tolerated the procedure well with no immediate complications.

## 2019-07-22 NOTE — Progress Notes (Signed)
Weaning initiated at this time no distress or complications noted.

## 2019-07-22 NOTE — Progress Notes (Signed)
Pulmonary Mechanics the best of three attempts.  NIF: <28 FVC: 839ml

## 2019-07-22 NOTE — Progress Notes (Signed)
Patient transferred from 2H02 to OR holding Rm 36 on 2L with 2H RN and transport via stretcher. Report given to Babb with all questions answered.

## 2019-07-22 NOTE — Progress Notes (Signed)
Patient returned to full support settings. Patient unable to perform parameters adequately. P02 on ABG too low for extubation. RT will continue to monitor.

## 2019-07-22 NOTE — Progress Notes (Signed)
     [address]       [place] [address]             [phone]      No events overnight  Vitals:   07/22/19 0523 07/22/19 0600  BP: (!) 153/80 139/81  Pulse: 82 79  Resp:  (!) 24  Temp:    SpO2:  99%   Alert NAD Sinus EWOB  2 V CAD, pneumonia OR today for CABG X 2  Ana Liaw O Jennika Ringgold

## 2019-07-22 NOTE — Progress Notes (Signed)
Patient is on Venti-mask 55% tolerating it well.

## 2019-07-22 NOTE — Progress Notes (Signed)
Abnormal ABG called to MD Hendrikson. Gave orders to continue with extubation despite PO2 of 49. Will continue to monitor.

## 2019-07-22 NOTE — Progress Notes (Signed)
Despite abnormal ABG oxygenation values PaO2 49 and saturations 84%  MD Roxan Hockey wants to precede with extubation. Wants to support his oxygenation.

## 2019-07-22 NOTE — Transfer of Care (Signed)
Immediate Anesthesia Transfer of Care Note  Patient: Philip Hurst  Procedure(s) Performed: OFF PUMP CORONARY ARTERY BYPASS GRAFTING (CABG) times two using left internal mammary artery and right leg saphenous vein (Chest)  Patient Location: ICU  Anesthesia Type:General  Level of Consciousness: Patient remains intubated per anesthesia plan  Airway & Oxygen Therapy: Patient remains intubated per anesthesia plan and Patient placed on Ventilator (see vital sign flow sheet for setting)  Post-op Assessment: Report given to RN and Post -op Vital signs reviewed and stable  Post vital signs: Reviewed and stable  Last Vitals:  Vitals Value Taken Time  BP    Temp    Pulse    Resp    SpO2      Last Pain:  Vitals:   07/22/19 0400  TempSrc: Oral  PainSc: 0-No pain      Patients Stated Pain Goal: 7 ([id number])  Complications: No apparent anesthesia complications

## 2019-07-22 NOTE — Progress Notes (Signed)
Pt is now on PSV/CPAP. Explained to patient in detail on the weaning criteria and gradually taking some of the support away on the Vent, Patient nodded his head for understanding. Goal is to maintain good ventilation and oxygenation to meet systemic and myocardial oxygen demands to assure adequate tissue perfusion and oxygenation. Patient is currently hemodynamically stable no distress or complications noted. RRT at bedside.

## 2019-07-22 NOTE — Op Note (Signed)
     [address]       [place] [address]             [phone]                                         07/22/2019    Patient:  Philip Hurst Pre-Op Dx:  STEMI   2V CAD   Pneumonia   Post-op Dx:  same Procedure: Off pump CABG X 2.  LIMA LAD, RSVG PDA   Endoscopic greater saphenous vein harvest on the right Intra-operative Transesophageal Echocardiogram  Surgeon and Role:      * Lorra Freeman, Lucile Crater, MD - Primary    Josie Saunders, [facility] - assisting   Anesthesia  general EBL:  500 ml Blood Administration: 2 units of blood  Drains: 25 F blake drain: R, mediastinal  Wires: none Counts: correct   Indications: 78 year old male currently being treated for pneumonia and sepsis who was noted to have three-vessel coronary disease.  Most recent echo shows that his EF is down to 25%.  There is no significant valvular disease.  Targets are acceptable for surgical bypass.  He was medically optimized for his pneumonia prior to surgery.  Findings: Good conduit good LAD target.  Performed an anastomosis to the PDA distal on the inferior wall.  There was a large scar in the inferior wall as well.  Operative Technique: After the risks, benefits and alternatives were thoroughly discussed, the patient was brought to the operative theatre.  All invasive lines were placed in pre-op holding.  Anesthesia was induced, and the patient was prepped and draped in normal sterile fashion.  An appropriate surgical pause was performed, and pre-operative antibiotics were dosed accordingly.  We began with simultaneous incisions were made along the right leg for harvesting of the greater saphenous vein and the chest for the sternotomy.  In regards to the sternotomy, this was carried down with bovie cautery, and the sternum was divided with a reciprocating saw.  Meticulous hemostasis was obtained.  The left internal thoracic artery was exposed and harvested in in pedicled fashion.  The patient  was systemically heparinized, and the artery was divided distally, and placed in a papaverine sponge.    The sternal elevator was removed, and a retractor was placed.  The pericardium was divided in the midline and fashioned into a cradle with pericardial stitches.    Next, we exposed a good target on the LAD, and fashioned an end to side anastomosis between it and the LITA.  We next exposed the posterior wall and identified a good target on PDA.  An end to side anastomosis between it and reverse saphenous vein.  Meticulous hemostasis was obtained.    A partial occludding clamp was then placed on the ascending aorta, and we created an end to side anastomosis between it and the proximal vein graft.  The proximal site was marked with a ring.  Hemostasis was obtained, and the heparin was reversed with protamine.  Chest tubes and wires were placed, and the sternum was re-approximated with with sternal wires.  The soft tissue and skin were re-approximated wth absorbable suture.    The patient tolerated the procedure without any immediate complications, and was transferred to the ICU in guarded condition.  [name]

## 2019-07-22 NOTE — Anesthesia Procedure Notes (Signed)
Procedure Name: Intubation Date/Time: 07/22/2019 7:54 AM Performed by: Marsden Zaino T, CRNA Pre-anesthesia Checklist: Patient identified, Emergency Drugs available, Suction available and Patient being monitored Patient Re-evaluated:Patient Re-evaluated prior to induction Oxygen Delivery Method: Circle system utilized Preoxygenation: Pre-oxygenation with 100% oxygen Induction Type: IV induction Ventilation: Mask ventilation without difficulty and Oral airway inserted - appropriate to patient size Laryngoscope Size: Miller and 2 Grade View: Grade I Tube type: Oral Tube size: 8.0 mm Number of attempts: 1 Airway Equipment and Method: Patient positioned with wedge pillow and Stylet Placement Confirmation: ETT inserted through vocal cords under direct vision,  positive ETCO2 and breath sounds checked- equal and bilateral Secured at: 22 cm Tube secured with: Tape Dental Injury: Teeth and Oropharynx as per pre-operative assessment

## 2019-07-22 NOTE — Anesthesia Procedure Notes (Signed)
Central Venous Catheter Insertion Performed by: Annye Asa, MD, anesthesiologist Start/End9/30/2020 6:21 AM, 07/22/2019 6:36 AM Patient location: Pre-op. Preanesthetic checklist: patient identified, IV checked, risks and benefits discussed, surgical consent, monitors and equipment checked, pre-op evaluation, timeout performed and anesthesia consent Position: Trendelenburg Lidocaine 1% used for infiltration and patient sedated Hand hygiene performed , maximum sterile barriers used  and Seldinger technique used Catheter size: 8.5 Fr PA cath was placed.Sheath introducer Swan type:thermodilution Procedure performed using ultrasound guided technique. Ultrasound Notes:anatomy identified, needle tip was noted to be adjacent to the nerve/plexus identified, no ultrasound evidence of intravascular and/or intraneural injection and image(s) printed for medical record Attempts: 1 Following insertion, line sutured, dressing applied and Biopatch. Post procedure assessment: blood return through all ports, free fluid flow and no air  Patient tolerated the procedure well with no immediate complications. Additional procedure comments: PA catheter:  Routine monitors. Timeout, sterile prep, drape, FBP R neck.  Trendelenburg position.  1% Lido local, finder and trocar RIJ 1st pass with US guidance.  Cordis placed over J wire. PA catheter in easily.  Sterile dressing applied.  Patient tolerated well, VSS.  Jenita Seashore, MD.

## 2019-07-23 ENCOUNTER — Encounter (HOSPITAL_COMMUNITY): Payer: Self-pay | Admitting: Thoracic Surgery (Cardiothoracic Vascular Surgery)

## 2019-07-23 ENCOUNTER — Inpatient Hospital Stay (HOSPITAL_COMMUNITY): Payer: Medicare Other

## 2019-07-23 DIAGNOSIS — Z951 Presence of aortocoronary bypass graft: Secondary | ICD-10-CM

## 2019-07-23 LAB — GLUCOSE, CAPILLARY
Glucose-Capillary: 103 mg/dL — ABNORMAL HIGH (ref 70–99)
Glucose-Capillary: 104 mg/dL — ABNORMAL HIGH (ref 70–99)
Glucose-Capillary: 105 mg/dL — ABNORMAL HIGH (ref 70–99)
Glucose-Capillary: 106 mg/dL — ABNORMAL HIGH (ref 70–99)
Glucose-Capillary: 106 mg/dL — ABNORMAL HIGH (ref 70–99)
Glucose-Capillary: 107 mg/dL — ABNORMAL HIGH (ref 70–99)
Glucose-Capillary: 107 mg/dL — ABNORMAL HIGH (ref 70–99)
Glucose-Capillary: 116 mg/dL — ABNORMAL HIGH (ref 70–99)
Glucose-Capillary: 118 mg/dL — ABNORMAL HIGH (ref 70–99)
Glucose-Capillary: 126 mg/dL — ABNORMAL HIGH (ref 70–99)
Glucose-Capillary: 130 mg/dL — ABNORMAL HIGH (ref 70–99)
Glucose-Capillary: 130 mg/dL — ABNORMAL HIGH (ref 70–99)
Glucose-Capillary: 133 mg/dL — ABNORMAL HIGH (ref 70–99)
Glucose-Capillary: 136 mg/dL — ABNORMAL HIGH (ref 70–99)
Glucose-Capillary: 93 mg/dL (ref 70–99)
Glucose-Capillary: 94 mg/dL (ref 70–99)
Glucose-Capillary: 97 mg/dL (ref 70–99)
Glucose-Capillary: 97 mg/dL (ref 70–99)

## 2019-07-23 LAB — BASIC METABOLIC PANEL
Anion gap: 9 (ref 5–15)
Anion gap: 8 (ref 5–15)
BUN: 19 mg/dL (ref 8–23)
BUN: 17 mg/dL (ref 8–23)
CO2: 22 mmol/L (ref 22–32)
CO2: 23 mmol/L (ref 22–32)
Calcium: 7.8 mg/dL — ABNORMAL LOW (ref 8.9–10.3)
Calcium: 7.8 mg/dL — ABNORMAL LOW (ref 8.9–10.3)
Chloride: 108 mmol/L (ref 98–111)
Chloride: 107 mmol/L (ref 98–111)
Creatinine, Ser: 1.02 mg/dL (ref 0.61–1.24)
Creatinine, Ser: 0.93 mg/dL (ref 0.61–1.24)
GFR calc Af Amer: >60 mL/min (ref >60)
GFR calc Af Amer: >60 mL/min (ref >60)
GFR calc non Af Amer: >60 mL/min (ref >60)
GFR calc non Af Amer: >60 mL/min (ref >60)
Glucose, Bld: 110 mg/dL — ABNORMAL HIGH (ref 70–99)
Glucose, Bld: 113 mg/dL — ABNORMAL HIGH (ref 70–99)
Potassium: 3.6 mmol/L (ref 3.5–5.1)
Potassium: 3.9 mmol/L (ref 3.5–5.1)
Sodium: 139 mmol/L (ref 135–145)
Sodium: 138 mmol/L (ref 135–145)

## 2019-07-23 LAB — POCT I-STAT 7, (LYTES, BLD GAS, ICA,H+H)
Acid-base deficit: 2 mmol/L (ref 0.0–2.0)
Bicarbonate: 22.2 mmol/L (ref 20.0–28.0)
Calcium, Ion: 1.17 mmol/L (ref 1.15–1.40)
HCT: 29 % — ABNORMAL LOW (ref 39.0–52.0)
Hemoglobin: 9.9 g/dL — ABNORMAL LOW (ref 13.0–17.0)
O2 Saturation: 93 %
Patient temperature: 37.7
Potassium: 3.7 mmol/L (ref 3.5–5.1)
Sodium: 141 mmol/L (ref 135–145)
TCO2: 23 mmol/L (ref 22–32)
pCO2 arterial: 34.5 mmHg (ref 32.0–48.0)
pH, Arterial: 7.418 (ref 7.350–7.450)
pO2, Arterial: 68 mmHg — ABNORMAL LOW (ref 83.0–108.0)

## 2019-07-23 LAB — BPAM RBC
Blood Product Expiration Date: 202010252359
Blood Product Expiration Date: 202010252359
ISSUE DATE / TIME: 202009300924
ISSUE DATE / TIME: 202009300924
Unit Type and Rh: 7300
Unit Type and Rh: 7300

## 2019-07-23 LAB — ECHO INTRAOPERATIVE TEE
Height: 63 in
Weight: 2892.44 oz

## 2019-07-23 LAB — TYPE AND SCREEN
ABO/RH(D): B POS
Antibody Screen: NEGATIVE
Unit division: 0
Unit division: 0

## 2019-07-23 LAB — CBC
HCT: 32.6 % — ABNORMAL LOW (ref 39.0–52.0)
HCT: 32.1 % — ABNORMAL LOW (ref 39.0–52.0)
Hemoglobin: 10.9 g/dL — ABNORMAL LOW (ref 13.0–17.0)
Hemoglobin: 11 g/dL — ABNORMAL LOW (ref 13.0–17.0)
MCH: 31 pg (ref 26.0–34.0)
MCH: 31.7 pg (ref 26.0–34.0)
MCHC: 33.4 g/dL (ref 30.0–36.0)
MCHC: 34.3 g/dL (ref 30.0–36.0)
MCV: 92.6 fL (ref 80.0–100.0)
MCV: 92.5 fL (ref 80.0–100.0)
Platelets: 170 10*3/uL (ref 150–400)
Platelets: 160 10*3/uL (ref 150–400)
RBC: 3.52 MIL/uL — ABNORMAL LOW (ref 4.22–5.81)
RBC: 3.47 MIL/uL — ABNORMAL LOW (ref 4.22–5.81)
RDW: 14.8 % (ref 11.5–15.5)
RDW: 14.7 % (ref 11.5–15.5)
WBC: 9.3 10*3/uL (ref 4.0–10.5)
WBC: 10.2 10*3/uL (ref 4.0–10.5)
nRBC: 0 % (ref 0.0–0.2)
nRBC: 0 % (ref 0.0–0.2)

## 2019-07-23 LAB — MAGNESIUM
Magnesium: 2.1 mg/dL (ref 1.7–2.4)
Magnesium: 2 mg/dL (ref 1.7–2.4)

## 2019-07-23 MED ORDER — CARVEDILOL 6.25 MG PO TABS: 6.2500 mg | ORAL_TABLET | Freq: Two times a day (BID) | ORAL | Status: DC

## 2019-07-23 MED ORDER — ENOXAPARIN SODIUM 30 MG/0.3ML ~~LOC~~ SOLN
30.0000 mg | Freq: Every day | SUBCUTANEOUS | Status: DC
  Administered 2019-07-23 – 2019-07-28 (×6): 30 mg via SUBCUTANEOUS
  Filled 2019-07-23 (×6): qty 0.3

## 2019-07-23 MED ORDER — INSULIN ASPART 100 UNIT/ML ~~LOC~~ SOLN: 0.0000 [IU] | SUBCUTANEOUS | Status: DC

## 2019-07-23 MED ORDER — CARVEDILOL 6.25 MG PO TABS
6.2500 mg | ORAL_TABLET | Freq: Two times a day (BID) | ORAL | Status: DC
  Administered 2019-07-23 – 2019-07-24 (×3): 6.25 mg via ORAL
  Filled 2019-07-23 (×3): qty 1

## 2019-07-23 MED ORDER — POTASSIUM CHLORIDE 10 MEQ/50ML IV SOLN
10.0000 meq | INTRAVENOUS | Status: AC
  Administered 2019-07-23 (×3): 10 meq via INTRAVENOUS
  Filled 2019-07-23 (×3): qty 50

## 2019-07-23 MED FILL — Cefuroxime Sodium For Inj 750 MG: INTRAMUSCULAR | Qty: 750 | Status: AC

## 2019-07-23 MED FILL — Lidocaine HCl Local Preservative Free (PF) Inj 2%: INTRAMUSCULAR | Qty: 15 | Status: AC

## 2019-07-23 MED FILL — Potassium Chloride Inj 2 mEq/ML: INTRAVENOUS | Qty: 20 | Status: AC

## 2019-07-23 MED FILL — Dexmedetomidine HCl in NaCl 0.9% IV Soln 400 MCG/100ML: INTRAVENOUS | Qty: 100 | Status: AC

## 2019-07-23 MED FILL — Heparin Sodium (Porcine) Inj 1000 Unit/ML: INTRAMUSCULAR | Qty: 30 | Status: AC

## 2019-07-23 NOTE — Anesthesia Postprocedure Evaluation (Signed)
Anesthesia Post Note  Patient: Philip Hurst  Procedure(s) Performed: OFF PUMP CORONARY ARTERY BYPASS GRAFTING (CABG) times two using left internal mammary artery and right leg saphenous vein (Chest)     Patient location during evaluation: SICU Anesthesia Type: General Level of consciousness: sedated Pain management: pain level controlled Vital Signs Assessment: post-procedure vital signs reviewed and stable Respiratory status: patient remains intubated per anesthesia plan Cardiovascular status: stable Postop Assessment: no apparent nausea or vomiting Anesthetic complications: no    Last Vitals:  Vitals:   07/23/19 0600 07/23/19 0700  BP: 139/70 123/66  Pulse: 90 84  Resp: (!) 21 (!) 22  Temp: 37.6 C 37.7 C  SpO2: 96% 98%    Last Pain:  Vitals:   07/23/19 0435  TempSrc:   PainSc: [place]

## 2019-07-23 NOTE — Progress Notes (Addendum)
[facility] BRIEF SICU PROGRESS NOTE  1 Day Post-Op  S/P Procedure(s): OFF PUMP CORONARY ARTERY BYPASS GRAFTING (CABG) times two using left internal mammary artery and right leg saphenous vein   Stable day NSR w/ stable BP on very low dose nicardipine Breathing comfortably on room air  Plan: Continue routine care  Rexene Alberts, MD 07/23/2019 6:44 PM

## 2019-07-23 NOTE — Progress Notes (Signed)
Progress Note  Patient Name: Philip Hurst Date of Encounter: 07/23/2019  Primary Cardiologist: No primary care provider on file.   Subjective   Chest is sore this morning.  Otherwise no specific complaints.  Inpatient Medications    Scheduled Meds: . acetaminophen  1,000 mg Oral Q6H   Or  . acetaminophen (TYLENOL) oral liquid 160 mg/5 mL  1,000 mg Per Tube Q6H  . aspirin EC  325 mg Oral Daily   Or  . aspirin  324 mg Per Tube Daily  . atorvastatin  80 mg Oral q1800  . bisacodyl  10 mg Oral Daily   Or  . bisacodyl  10 mg Rectal Daily  . cefdinir  300 mg Oral Q12H  . Chlorhexidine Gluconate Cloth  6 each Topical Daily  . docusate sodium  200 mg Oral Daily  . gabapentin  100 mg Oral QHS  . insulin regular  0-10 Units Intravenous TID WC  . loratadine  10 mg Oral Daily  . mouth rinse  15 mL Mouth Rinse BID  . metoprolol tartrate  12.5 mg Oral BID   Or  . metoprolol tartrate  12.5 mg Per Tube BID  . [START ON 07/24/2019] pantoprazole  40 mg Oral Daily  . sodium chloride flush  10-40 mL Intracatheter Q12H  . sodium chloride flush  3 mL Intravenous Q12H   Continuous Infusions: . sodium chloride    . sodium chloride    . sodium chloride    . dexmedetomidine (PRECEDEX) IV infusion Stopped (07/22/19 1330)  . DOBUTamine 2.5 mcg/kg/min (07/23/19 0700)  . famotidine (PEPCID) IV Stopped (07/22/19 1220)  . insulin 0.5 mL/hr at 07/23/19 0700  . lactated ringers    . lactated ringers    . lactated ringers 20 mL/hr at 07/23/19 0700  . niCARDipine 2.5 mg/hr (07/23/19 0700)  . norepinephrine (LEVOPHED) Adult infusion Stopped (07/22/19 1349)  . potassium chloride 10 mEq (07/23/19 0732)   PRN Meds: sodium chloride, lactated ringers, metoprolol tartrate, midazolam, morphine injection, ondansetron (ZOFRAN) IV, oxyCODONE, sodium chloride flush, sodium chloride flush, traMADol   Vital Signs    Vitals:   07/23/19 0400 07/23/19 0500 07/23/19 0600 07/23/19 0700  BP: 122/64 124/65 139/70  123/66  Pulse: 84 87 90 84  Resp: (!) 21 (!) 21 (!) 21 (!) 22  Temp: 99.3 F (37.4 C) 99.5 F (37.5 C) 99.7 F (37.6 C) 99.9 F (37.7 C)  TempSrc:      SpO2: (!) 89% 95% 96% 98%  Weight:  83.8 kg    Height:        Intake/Output Summary (Last 24 hours) at 07/23/2019 0741 Last data filed at 07/23/2019 0700 Gross per 24 hour  Intake 6245.5 ml  Output 2550 ml  Net 3695.5 ml   Last 3 Weights 07/23/2019 07/20/2019 07/19/2019  Weight (lbs) 184 lb 11.9 oz 180 lb 12.4 oz 175 lb 7.8 oz  Weight (kg) 83.8 kg 82 kg 79.6 kg      Telemetry    Normal sinus rhythm with no significant arrhythmia - Personally Reviewed   Physical Exam  Alert, oriented, elderly male in no distress GEN: No acute distress.   Neck:  Moderate JVD Cardiac: RRR, no murmurs, rubs, or gallops.  Respiratory: Clear to auscultation bilaterally. GI: Soft, nontender, non-distended  MS: No edema; No deformity. Neuro:  Nonfocal  Psych: Normal affect   Labs    High Sensitivity Troponin:   Recent Labs  Lab 07/17/19 0631 07/18/19 1243 07/18/19 2325 07/19/19 0900  07/20/19 0845  TROPONINIHS 1,370* 3,396* 3,206* 2,175* 941*      Chemistry Recent Labs  Lab 07/16/19 [id number]  07/18/19 0226  07/20/19 0221  07/22/19 0452  07/22/19 1042  07/22/19 1722 07/22/19 2008 07/23/19 0143 07/23/19 0328  NA 135   < > 145   < > 140   < > 141   < > 142   < > 138 142 141 139  K 4.0   < > 3.7   < > 3.5   < > 3.6   < > 3.5   < > 4.4 4.0 3.7 3.6  CL 104   < > 119*   < > 109   < > 107   < > 106  --  108  --   --  108  CO2 14*   < > 17*   < > 18*   < > 23  --   --   --  22  --   --  22  GLUCOSE 103*   < > 125*   < > 143*   < > 110*   < > 140*  --  136*  --   --  110*  BUN 93*   < > 37*   < > 44*   < > 34*   < > 26*  --  25*  --   --  19  CREATININE 4.89*   < > 1.65*   < > 1.58*   < > 1.29*   < > 0.90  --  1.03  --   --  1.02  CALCIUM 7.5*   < > 8.1*   < > 8.2*   < > 8.0*  --   --   --  7.7*  --   --  7.8*  PROT 5.7*  --   --   --    --   --  5.1*  --   --   --   --   --   --   --   ALBUMIN 3.1*   < > 2.3*  --  2.3*  --  2.1*  --   --   --   --   --   --   --   AST 25  --   --   --   --   --  98*  --   --   --   --   --   --   --   ALT 22  --   --   --   --   --  299*  --   --   --   --   --   --   --   ALKPHOS 92  --   --   --   --   --  69  --   --   --   --   --   --   --   BILITOT 0.1*  --   --   --   --   --  0.3  --   --   --   --   --   --   --   GFRNONAA 11*   < > 39*   < > 41*   < > 53*  --   --   --  >60  --   --  >60  GFRAA 12*   < > 45*   < > 48*   < > >  60  --   --   --  >60  --   --  >60  ANIONGAP 17*   < > 9   < > 13   < > 11  --   --   --  8  --   --  9   < > = values in this interval not displayed.     Hematology Recent Labs  Lab 07/22/19 1157  07/22/19 1722 07/22/19 2008 07/23/19 0143 07/23/19 0328  WBC 9.6  --  10.4  --   --  9.3  RBC 3.15*  --  3.67*  --   --  3.52*  HGB 9.9*   < > 11.4* 10.5* 9.9* 10.9*  HCT 30.2*   < > 34.6* 31.0* 29.0* 32.6*  MCV 95.9  --  94.3  --   --  92.6  MCH 31.4  --  31.1  --   --  31.0  MCHC 32.8  --  32.9  --   --  33.4  RDW 14.4  --  14.7  --   --  14.8  PLT 126*  --  170  --   --  170   < > = values in this interval not displayed.    BNPNo results for input(s): BNP, PROBNP in the last 168 hours.   DDimer  Recent Labs  Lab 07/16/19 0928  DDIMER 1.38*     Radiology    Dg Chest Port 1 View  Result Date: 07/22/2019 CLINICAL DATA:  Status post coronary artery bypass surgery. EXAM: PORTABLE CHEST 1 VIEW COMPARISON:  07/19/2019 FINDINGS: The endotracheal tube is in good position, 4.3 cm above the carina. The NG tube is coursing down the esophagus and into the stomach. Right IJ Swan-Ganz catheter tip is in the main pulmonary artery. A left-sided chest tube is in place. No pneumothorax. Persistent asymmetric pattern of perihilar pulmonary edema and right pleural effusion. The bony thorax is intact. IMPRESSION: 1. Status post coronary artery bypass surgery with  support apparatus in good position without complicating features. 2. Left-sided chest tube in good position without left-sided pneumothorax. 3. Asymmetric pattern of perihilar pulmonary edema and streaky atelectasis along with a small right pleural effusion. Electronically Signed   By: Marijo Sanes M.D.   On: 07/22/2019 12:18   Vas US Doppler Pre Cabg  Result Date: 07/21/2019 PREOPERATIVE VASCULAR EVALUATION  Indications:  Pre CABG. Risk Factors: PAD. Performing Technologist: June Leap Rvt, Rdms  Examination Guidelines: A complete evaluation includes B-mode imaging, spectral Doppler, color Doppler, and power Doppler as needed of all accessible portions of each vessel. Bilateral testing is considered an integral part of a complete examination. Limited examinations for reoccurring indications may be performed as noted.  Right Carotid Findings: +----------+--------+--------+--------+---------------------+------------------+           PSV cm/sEDV cm/sStenosisDescribe             Comments           +----------+--------+--------+--------+---------------------+------------------+ CCA Prox  75      16              homogeneous                             +----------+--------+--------+--------+---------------------+------------------+ CCA Distal65      17                                                      +----------+--------+--------+--------+---------------------+------------------+  ICA Prox  109     36      1-39%   homogeneous and      upper end of range                                   smooth                                  +----------+--------+--------+--------+---------------------+------------------+ ICA Distal81      28                                                      +----------+--------+--------+--------+---------------------+------------------+ ECA       88      8                                                        +----------+--------+--------+--------+---------------------+------------------+ Portions of this table do not appear on this page. +----------+--------+-------+----------------+------------+           PSV cm/sEDV cmsDescribe        Arm Pressure +----------+--------+-------+----------------+------------+ Subclavian78             Multiphasic, WNL             +----------+--------+-------+----------------+------------+ +---------+--------+--+--------+--+---------+ VertebralPSV cm/s80EDV cm/s21Antegrade +---------+--------+--+--------+--+---------+ Left Carotid Findings: +----------+--------+--------+--------+-------------------------+--------+           PSV cm/sEDV cm/sStenosisDescribe                 Comments +----------+--------+--------+--------+-------------------------+--------+ CCA Prox  81      18                                                +----------+--------+--------+--------+-------------------------+--------+ CCA Distal94      23                                                +----------+--------+--------+--------+-------------------------+--------+ ICA Prox  92      24      1-39%   heterogenous and calcific         +----------+--------+--------+--------+-------------------------+--------+ ICA Distal92      30                                                +----------+--------+--------+--------+-------------------------+--------+ ECA       99      10                                                +----------+--------+--------+--------+-------------------------+--------+ +----------+--------+--------+----------------+------------+ SubclavianPSV cm/sEDV cm/sDescribe  Arm Pressure +----------+--------+--------+----------------+------------+           153             Multiphasic, [id number]          +----------+--------+--------+----------------+------------+ +---------+--------+--------+------+ VertebralPSV cm/sEDV cm/sAbsent  +---------+--------+--------+------+  ABI Findings: +--------+-----------------+-----+------------------+--------------------------+ Right   Rt Pressure      IndexWaveform          Comment                            (mmHg)                                                             +--------+-----------------+-----+------------------+--------------------------+ Brachial                      triphasic         BP not taken due to IV                                                     site                       +--------+-----------------+-----+------------------+--------------------------+ ATA     126              0.95 biphasic                                     +--------+-----------------+-----+------------------+--------------------------+ PTA                           unable to insonate                           +--------+-----------------+-----+------------------+--------------------------+ +--------+------------------+-----+----------+-------+ Left    Lt Pressure (mmHg)IndexWaveform  Comment +--------+------------------+-----+----------+-------+ [id number]                    triphasic         +--------+------------------+-----+----------+-------+ ATA     75                0.56 monophasic        +--------+------------------+-----+----------+-------+ PTA     55                0.41 monophasic        +--------+------------------+-----+----------+-------+  Right Doppler Findings: +--------+--------+-----+---------+---------------------------+ Site    PressureIndexDoppler  Comments                    +--------+--------+-----+---------+---------------------------+ Brachial             triphasicBP not taken due to IV site +--------+--------+-----+---------+---------------------------+ Radial               triphasic                            +--------+--------+-----+---------+---------------------------+ Ulnar  triphasic                             +--------+--------+-----+---------+---------------------------+  Left Doppler Findings: +--------+--------+-----+---------+--------+ Site    PressureIndexDoppler  Comments +--------+--------+-----+---------+--------+ [id number]          triphasic         +--------+--------+-----+---------+--------+ Radial               triphasic         +--------+--------+-----+---------+--------+ Ulnar                triphasic         +--------+--------+-----+---------+--------+  Summary: Right Carotid: Velocities in the right ICA are consistent with a 1-39% stenosis. Left Carotid: Velocities in the left ICA are consistent with a 1-39% stenosis. Right ABI: Resting right ankle-brachial index is within normal range. No evidence of significant right lower extremity arterial disease. Although PTA not audible. Left ABI: Resting left ankle-brachial index indicates moderate left lower extremity arterial disease. Right Upper Extremity: Doppler waveforms remain within normal limits with right radial compression. Doppler waveforms remain within normal limits with right ulnar compression. Left Upper Extremity: Doppler waveform obliterate with left radial compression. Doppler waveforms remain within normal limits with left ulnar compression.  Electronically signed by Deitra Mayo MD on 07/21/2019 at 4:31:37 PM.    Final     Cardiac Studies   07/19/2019 ECHO:  1. Left ventricular ejection fraction, by visual estimation, is 25%. The left ventricle has severely decreased function. There is no left ventricular hypertrophy. 2. Left ventricular diastolic Doppler parameters are indeterminate pattern of LV diastolic filling. 3. The anteroseptal wall and apex are akinetic. 4. Global right ventricle has normal systolic function.The right ventricular size is normal. No increase in right ventricular wall thickness. 5. Left atrial size was normal. 6. Right atrial size was normal. 7.  The mitral valve is abnormal. No evidence of mitral valve regurgitation. No evidence of mitral stenosis. 8. The tricuspid valve is normal in structure. Tricuspid valve regurgitation was not visualized by color flow Doppler. 9. The aortic valve has an indeterminant number of cusps Aortic valve regurgitation was not visualized by color flow Doppler. Mild aortic valve sclerosis without stenosis. 10. The pulmonic valve was not well visualized. Pulmonic valve regurgitation is not visualized by color flow Doppler. 11. The inferior vena cava is normal in size with greater than 50% respiratory variability, suggesting right atrial pressure of 3 mmHg. 12. Acute decrease in LVEF and acute wall motion abnormalities as reported above compared to 07/17/19 echo  07/19/2019 LHC: 1. Acute systolic heart failure with precipitous drop in LVEF with anteroapical wall motion abnormality likely related to acute ischemia. Cannot totally exclude the possibility of a stress cardiomyopathy component. LVEDP is 20 mmHg. 2. Severe coronary artery disease with segmental 70 to 90% proximal to mid LAD stenosis. 3. 60% second obtuse marginal. The first marginal is small. 4. Total occlusion, flush, RCA with large collateral bed from left circumflex to distal left ventricular branch.  Patient Profile     78 y.o. male w/ a PMHx significant for medically managed CAD (RCA disease), HTN, HLD and PAD who presented with AMS and imaging findings concerning for pneumonia with EKG without acute ischemic changes initially who then developed lateral TWIs and ST elevation in the anterior and lateral precordial leads with a troponemia up to 3396, a precipitous drop in LVEF from 55-60% on admission down to 25-30% with anteroapical wall motion  abnormality who went for [facility] showing severe CAD w/ segmental 70-90% proximal to mid LAD stenosis, 60% second obtuse marginal, total occlusion RCA on IV heparin, IV amiodorone for afib/aflutter (now in NSR)  ultimately treated with CABG 07/22/2019  Assessment & Plan    1. Acute anterior wall MI: found to have severe multivessel CAD.  Treated with IV heparin and medical therapy followed by CABG, now postoperative day #1, progressing well currently on low doses of dobutamine and nicardipine.  2.  Acute systolic heart failure: Secondary to acute myocardial infarction/ischemic heart disease.  Now revascularized, progressing in the early postoperative period.  Hemodynamically stable.  Invasive hemodynamics demonstrate normal pulmonary artery pressures.  3.  Acute kidney injury: Creatinine has normalized.  4.  Acute blood loss anemia, mild, expected postoperative.  Overall the patient is doing very well postoperative day #1.  He is maintaining good urine output on low-dose support.  Management per surgical team, greatly appreciate the care of Dr. Kipp Brood.  For questions or updates, please contact [place] Please consult [email or web address] for contact info under     Signed, Sherren Mocha, MD  07/23/2019, 7:41 AM

## 2019-07-23 NOTE — Discharge Summary (Signed)
Physician Discharge Summary  Patient ID: HAMADI GOLDMANN MRN: [id number] DOB/AGE: [birth year] 79 y.o.  Admit date: 07/16/2019 Discharge date: 07/29/2019  Admission Diagnoses:  Patient Active Problem List   Diagnosis Date Noted  . Coronary artery disease 07/22/2019  . Acute systolic HF (heart failure) ([place]) 07/19/2019  . Pneumonia 07/16/2019  . Sepsis ([place])   . PAD (peripheral artery disease) ([place]) 08/19/2013  . Claudication of left lower extremity, ABI on Lt now  .80 or less down from 1.0 in 01/2013 01/30/2013  . Normal cardiac stress test, No ischemia 12/15/12 01/30/2013  . Unspecified deficiency anemia 09/05/2012  . Hypertension 09/12/2011  . CAD (coronary artery disease), single vessel disease with collaterals, medical therapy 09/12/2011  . Hyperlipidemia 09/12/2011   Discharge Diagnoses:   Patient Active Problem List   Diagnosis Date Noted  . 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 Coronary artery disease 07/22/2019  . Acute systolic HF (heart failure) ([place]) 07/19/2019  . Pneumonia 07/16/2019  . Sepsis ([place])   . PAD (peripheral artery disease) ([place]) 08/19/2013  . Claudication of left lower extremity, ABI on Lt now  .80 or less down from 1.0 in 01/2013 01/30/2013  . Normal cardiac stress test, No ischemia 12/15/12 01/30/2013  . Unspecified deficiency anemia 09/05/2012  . Hypertension 09/12/2011  . CAD (coronary artery disease), single vessel disease with collaterals, medical therapy 09/12/2011  . Hyperlipidemia 09/12/2011   Discharged Condition: good  History of Present Illness/hospital course  Mr. Kauk is a 78 yo male who was admitted with AMS and sepsis.  CT scan was obtained and showed the patient to have a pneumonia.  He had elevated creatinine due to AKI with creatinine peaking at 3.49  During hospitalization he developed complaints of worsening shortness of breath and chest pain.  Troponin level was elevated and Cardiology consult was obtained.  Echocardiogram was reviewed and showed  normal LV function.  Cardiology felt elevated troponin was due to demand ischemia and signed off.  However repeat EKG showed elevated ST elevation with a further increase in his Troponin level.  The patient continued to complain of worsening shortness of breath but denied chest pain.  Repeat Cardiology consult was obtained at which time patient continued to deny chest pain.  He did admit to worsening shortness of breath with inability to lay flat.  Review of EKG was again performed and felt the changes were most likely due to hypervolemia and less likely ACS.  He was treated with diuretics.  Unfortunately he developed chest discomfort cardiology continued to follow up with patient.  They ordered echocardiogram, but patient ultimately developed respiratory distress.  He required BIPAP placement. Echocardiogram was performed and showed hypokinetic apex.  It was felt the patient should be taken to the catheterization lab.  This was performed on 07/19/2019 and showed multivessel CAD felt to be amenable to coronary bypass.  The patient was diuresed, started on heparin therapy and TCTS consult was obtained.  The patient was evaluated by Dr. Kipp Brood who felt coronary bypass grafting would be indicated.  He did feel the patient should be medically optimized prior to proceeding with surgery.  The risks and benefits of the procedure were explained to the patient and he was agreeable to proceed.  He was taken to the operating room on 07/22/2019.  He underwent off pump CABG x 2 utilizing LIMA to LAD and SVG to PDA.  He also underwent endoscopic harvest of greater saphenous vein from right thigh.  He tolerated the procedure without difficulty,  was extubated and taken to the SICU in stable condition.  He was extubated the evening of surgery.  His chest tubes and arterial lines were removed without difficulty.  He was weaned off Dobutamine and Nicardipine as tolerated.      Significant Diagnostic Studies: angiography:     Acute systolic heart failure with precipitous drop in LVEF with anteroapical wall motion abnormality likely related to acute ischemia.  Cannot totally exclude the possibility of a stress cardiomyopathy component.  LVEDP is 20 mmHg.  Severe coronary artery disease with segmental 70 to 90% proximal to mid LAD stenosis.  60% second obtuse marginal.  The first marginal is small.  Total occlusion, flush, RCA with large collateral bed from left circumflex to distal left ventricular branch.  RECOMMENDATIONS:   IV heparin  IV nitroglycerin  Gentle diuresis but being careful to monitor kidney function.  He has been exposed to contrast on top of acute kidney injury present on admission.  50 cc of contrast used.  Needs surgical consultation to consider bypass grafting.  Ultimately, high risk PCI with LAD atherectomy and stenting using hemodynamic support would be a consideration although with known PAD, support may not be possible.  Treatments: surgery:   Procedure: Off pump CABG X 2.  LIMA LAD, RSVG PDA   Endoscopic greater saphenous vein harvest on the right Intra-operative Transesophageal Echocardiogram  Discharge Exam: Blood pressure 137/66, pulse 77, temperature 97.6 F (36.4 C), temperature source Oral, resp. rate 13, height 5\' 3"  (1.6 m), weight 79.5 kg, SpO2 95 %.   General appearance: alert, cooperative and no distress Heart: regular rate and rhythm, S1, S2 normal, no murmur, click, rub or gallop Lungs: clear to auscultation bilaterally Abdomen: soft, non-tender; bowel sounds normal; no masses,  no organomegaly Extremities: extremities normal, atraumatic, no cyanosis or edema Wound: clean and dry  Discharge Medications:   Allergies as of 07/29/2019      Reactions   Plavix [clopidogrel Bisulfate] Itching, Rash      Medication List    STOP taking these medications   HYDROcodone-acetaminophen 10-325 MG tablet Commonly known as: NORCO   ketoconazole 2 %  shampoo Commonly known as: NIZORAL   losartan 100 MG tablet Commonly known as: COZAAR     TAKE these medications   aspirin 325 MG EC tablet Take 1 tablet (325 mg total) by mouth daily. What changed:   medication strength  how much to take   atorvastatin 80 MG tablet Commonly known as: LIPITOR Take 1 tablet (80 mg total) by mouth daily at 6 PM. What changed:   medication strength  how much to take  when to take this   carvedilol 25 MG tablet Commonly known as: COREG Take 1 tablet (25 mg total) by mouth 2 (two) times daily with a meal.   gabapentin 100 MG capsule Commonly known as: NEURONTIN Take 1 capsule (100 mg total) by mouth at bedtime.   ondansetron 4 MG tablet Commonly known as: Zofran Take 1 tablet (4 mg total) by mouth every 8 (eight) hours as needed for nausea or vomiting.   oxyCODONE 5 MG immediate release tablet Commonly known as: Oxy IR/ROXICODONE Take 1 tablet (5 mg total) by mouth every 6 (six) hours as needed for severe pain.   sacubitril-valsartan 49-51 MG Commonly known as: ENTRESTO Take 1 tablet by mouth 2 (two) times daily.   spironolactone 25 MG tablet Commonly known as: ALDACTONE Take 0.5 tablets (12.5 mg total) by mouth daily.      Follow-up  Information    Lorretta Harp, MD Follow up.   Specialties: Cardiology, Radiology Why: office will contact you with follow up appointment with Dr Kennon Holter PA/NP in 1-2 weeks Contact information: [address] [place] [address] [phone]        Lajuana Matte, MD Follow up.   Specialty: Thoracic Surgery Why: Your routine follow-up appointment is on 08/07/2019 at 12:45pm. No CXR needed.  Contact information: [address] [place] [address] [phone]        [facility] Follow up.   Specialty: Home Health Services Why: HHPT Contact information: [place] [address] [phone]         Erlene Quan, PA-C Follow up.   Specialties: Cardiology, Radiology Why: Appointment is on 08/13/2019 at 11:00am. Please bring hospital paperwork Contact information: [place] [address] [phone]          The patient has been discharged on:   1.Beta Blocker:  Yes [  ]                              No   [ no  ]                              If No, reason: first degree heart block  2.Ace Inhibitor/ARB: Yes [place]   ]                                     No  [    ]                                     If No, reason:  3.Statin:   Yes [ yes  ]                  No  [   ]                  If No, reason:  4.Ecasa:  Yes  [ yes  ]                  No   [   ]                  If No, reason:   Signed: Elgie Collard 07/29/2019, 9:33 AM

## 2019-07-23 NOTE — Progress Notes (Signed)
      [address]       [place] [address]             [phone]                 1 Day Post-Op Procedure(s): OFF PUMP CORONARY ARTERY BYPASS GRAFTING (CABG) times two using left internal mammary artery and right leg saphenous vein   Events: No events.  Pain controlled. Extubated last night _______________________________________________________________ Vitals: BP 123/66   Pulse 84   Temp 99.9 F (37.7 C)   Resp (!) 22   Ht 5\' 3"  (1.6 m)   Wt 83.8 kg   SpO2 98%   BMI 32.73 kg/m   - Neuro: alert NAD  - Cardiovascular: sinus,   Drips: Cardene.   PAP: (21-75)/(12-32) 32/18 CO:  [2.7 L/min-6.2 L/min] 5.5 L/min CI:  [1.5 L/min/m2-3.3 L/min/m2] 3 L/min/m2  - Pulm: EWOB  ABG    Component Value Date/Time   PHART 7.418 07/23/2019 0143   PCO2ART 34.5 07/23/2019 0143   PO2ART 68.0 (L) 07/23/2019 0143   HCO3 22.2 07/23/2019 0143   TCO2 23 07/23/2019 0143   ACIDBASEDEF 2.0 07/23/2019 0143   O2SAT 93.0 07/23/2019 0143    - Abd: soft NT, ND - Extremity: trace edema  .Intake/Output      09/30 0701 - 10/01 0700 10/01 0701 - 10/02 0700   I.V. (mL/kg) 4316.9 (51.5)    Blood 665    IV Piggyback 1263.6    Total Intake(mL/kg) 6245.5 (74.5)    Urine (mL/kg/hr) 2300 (1.1)    Chest Tube 250    Total Output 2550    Net +3695.5            _______________________________________________________________ Labs: CBC Latest Ref Rng & Units 07/23/2019 07/23/2019 07/22/2019  WBC 4.0 - 10.5 K/uL 9.3 - -  Hemoglobin 13.0 - 17.0 g/dL 10.9(L) 9.9(L) 10.5(L)  Hematocrit 39.0 - 52.0 % 32.6(L) 29.0(L) 31.0(L)  Platelets 150 - 400 K/uL 170 - -   CMP Latest Ref Rng & Units 07/23/2019 07/23/2019 07/22/2019  Glucose 70 - 99 mg/dL 110(H) - -  BUN 8 - 23 mg/dL 19 - -  Creatinine 0.61 - 1.24 mg/dL 1.02 - -  Sodium 135 - 145 mmol/L 139 141 142  Potassium 3.5 - 5.1 mmol/L 3.6 3.7 4.0  Chloride 98 - 111 mmol/L 108 - -  CO2 22 - 32 mmol/L 22 - -  Calcium 8.9 - 10.3 mg/dL  7.8(L) - -  Total Protein 6.5 - 8.1 g/dL - - -  Total Bilirubin 0.3 - 1.2 mg/dL - - -  Alkaline Phos 38 - 126 U/L - - -  AST 15 - 41 U/L - - -  ALT 0 - 44 U/L - - -    CXR: pending  _______________________________________________________________  Assessment and Plan: POD 1 s/p CABG 2.  Doing well  Neuro: pain control CV: will remove swan.  Dob off.  carvedilol started for low ef.  Will wean cardene.  Once off gtt, will remove a-line.  CT switched to bulb Pulm: continue pulm toilet Renal: good uop.  Creat stable GI: advancing diet Heme: stable ID: afebrile, nml wbc Endo: SSI Dispo: continue ICU care  Melodie Bouillon, MD 07/23/2019 10:13 AM

## 2019-07-24 ENCOUNTER — Inpatient Hospital Stay (HOSPITAL_COMMUNITY): Payer: Medicare Other

## 2019-07-24 LAB — BASIC METABOLIC PANEL
Anion gap: 11 (ref 5–15)
BUN: 20 mg/dL (ref 8–23)
CO2: 24 mmol/L (ref 22–32)
Calcium: 8.1 mg/dL — ABNORMAL LOW (ref 8.9–10.3)
Chloride: 103 mmol/L (ref 98–111)
Creatinine, Ser: 1.06 mg/dL (ref 0.61–1.24)
GFR calc Af Amer: >60 mL/min (ref >60)
GFR calc non Af Amer: >60 mL/min (ref >60)
Glucose, Bld: 97 mg/dL (ref 70–99)
Potassium: 4 mmol/L (ref 3.5–5.1)
Sodium: 138 mmol/L (ref 135–145)

## 2019-07-24 LAB — CBC
HCT: 33.1 % — ABNORMAL LOW (ref 39.0–52.0)
Hemoglobin: 11.3 g/dL — ABNORMAL LOW (ref 13.0–17.0)
MCH: 32 pg (ref 26.0–34.0)
MCHC: 34.1 g/dL (ref 30.0–36.0)
MCV: 93.8 fL (ref 80.0–100.0)
Platelets: 179 10*3/uL (ref 150–400)
RBC: 3.53 MIL/uL — ABNORMAL LOW (ref 4.22–5.81)
RDW: 14.7 % (ref 11.5–15.5)
WBC: 11.2 10*3/uL — ABNORMAL HIGH (ref 4.0–10.5)
nRBC: 0 % (ref 0.0–0.2)

## 2019-07-24 LAB — GLUCOSE, CAPILLARY
Glucose-Capillary: 113 mg/dL — ABNORMAL HIGH (ref 70–99)
Glucose-Capillary: 92 mg/dL (ref 70–99)
Glucose-Capillary: 117 mg/dL — ABNORMAL HIGH (ref 70–99)
Glucose-Capillary: 97 mg/dL (ref 70–99)
Glucose-Capillary: 115 mg/dL — ABNORMAL HIGH (ref 70–99)

## 2019-07-24 MED ORDER — POTASSIUM CHLORIDE CRYS ER 20 MEQ PO TBCR
20.0000 meq | EXTENDED_RELEASE_TABLET | Freq: Every day | ORAL | Status: DC
  Administered 2019-07-24: 09:00:00 20 meq via ORAL
  Filled 2019-07-24: qty 1

## 2019-07-24 MED ORDER — POTASSIUM CHLORIDE CRYS ER 20 MEQ PO TBCR: 40.0000 meq | EXTENDED_RELEASE_TABLET | Freq: Two times a day (BID) | ORAL | Status: DC

## 2019-07-24 MED ORDER — INSULIN ASPART 100 UNIT/ML ~~LOC~~ SOLN
0.0000 [IU] | SUBCUTANEOUS | Status: DC
  Administered 2019-07-26 – 2019-07-28 (×8): 2 [IU] via SUBCUTANEOUS

## 2019-07-24 MED ORDER — CARVEDILOL 12.5 MG PO TABS
12.5000 mg | ORAL_TABLET | Freq: Two times a day (BID) | ORAL | Status: DC
  Administered 2019-07-24 – 2019-07-26 (×4): 12.5 mg via ORAL
  Filled 2019-07-24 (×4): qty 1

## 2019-07-24 MED ORDER — FUROSEMIDE 40 MG PO TABS: 40.0000 mg | ORAL_TABLET | Freq: Every day | ORAL | Status: DC

## 2019-07-24 MED ORDER — FUROSEMIDE 10 MG/ML IJ SOLN
40.0000 mg | Freq: Once | INTRAMUSCULAR | Status: AC
  Administered 2019-07-24: 40 mg via INTRAVENOUS
  Filled 2019-07-24: qty 4

## 2019-07-24 MED ORDER — FUROSEMIDE 40 MG PO TABS
40.0000 mg | ORAL_TABLET | Freq: Every day | ORAL | Status: DC
  Administered 2019-07-25: 40 mg via ORAL
  Filled 2019-07-24: qty 1

## 2019-07-24 MED ORDER — LOSARTAN POTASSIUM 25 MG PO TABS
25.0000 mg | ORAL_TABLET | Freq: Every day | ORAL | Status: DC
  Administered 2019-07-24 – 2019-07-25 (×2): 25 mg via ORAL
  Filled 2019-07-24 (×2): qty 1

## 2019-07-24 MED FILL — Sodium Chloride IV Soln 0.9%: INTRAVENOUS | Qty: 3000 | Status: AC

## 2019-07-24 NOTE — Progress Notes (Signed)
Patient ID: Philip Hurst, male   DOB: [birth year], 78 y.o.   MRN: [id number] EVENING ROUNDS NOTE :     [address]       [place] [address]             [phone]                 2 Days Post-Op Procedure(s): OFF PUMP CORONARY ARTERY BYPASS GRAFTING (CABG) times two using left internal mammary artery and right leg saphenous vein  Total Length of Stay:  LOS: 8 days  BP (!) 151/73   Pulse 81   Temp (!) 97.5 F (36.4 C)   Resp 20   Ht 5\' 3"  (1.6 m)   Wt 83 kg   SpO2 98%   BMI 32.41 kg/m   .Intake/Output      10/01 0701 - 10/02 0700 10/02 0701 - 10/03 0700   P.O. 600 630   I.V. (mL/kg) 564.6 (6.8) 100 (1.2)   Blood     Other 0    IV Piggyback     Total Intake(mL/kg) 1164.6 (14) 730 (8.8)   Urine (mL/kg/hr) 1725 (0.9) 1100 (1.2)   Emesis/NG output 0    Stool 0    Chest Tube 0 120   Total Output 1725 1220   Net -560.4 -490        Urine Occurrence 1 x 2 x   Stool Occurrence 0 x    Emesis Occurrence 0 x      . sodium chloride    . sodium chloride    . sodium chloride    . lactated ringers    . lactated ringers    . lactated ringers 10 mL/hr at 07/24/19 1700     Lab Results  Component Value Date   WBC 11.2 (H) 07/24/2019   HGB 11.3 (L) 07/24/2019   HCT 33.1 (L) 07/24/2019   PLT 179 07/24/2019   GLUCOSE 97 07/24/2019   ALT 299 (H) 07/22/2019   AST 98 (H) 07/22/2019   NA 138 07/24/2019   K 4.0 07/24/2019   CL 103 07/24/2019   CREATININE 1.06 07/24/2019   BUN 20 07/24/2019   CO2 24 07/24/2019   TSH 1.393 08/29/2015   INR 1.6 (H) 07/22/2019   HGBA1C 6.0 (H) 07/21/2019   Stable day    Grace Isaac MD  Beeper [phone] Office [phone] 07/24/2019 5:48 PM

## 2019-07-24 NOTE — Progress Notes (Addendum)
TCTS DAILY ICU PROGRESS NOTE                   [address]            [place] [address]          [phone]   2 Days Post-Op Procedure(s): OFF PUMP CORONARY ARTERY BYPASS GRAFTING (CABG) times two using left internal mammary artery and right leg saphenous vein  Total Length of Stay:  LOS: 8 days   Subjective:  Patient up in chair.  Has some pain which is relieved some with pain medication.  Denies N/V  Objective: Vital signs in last 24 hours: Temp:  [97.9 F (36.6 C)-99.5 F (37.5 C)] 98.2 F (36.8 C) (10/02 0700) Pulse Rate:  [67-85] 85 (10/02 0700) Cardiac Rhythm: Heart block (10/02 0000) Resp:  [17-24] 20 (10/02 0700) BP: (93-149)/(62-105) 149/105 (10/02 0700) SpO2:  [92 %-98 %] 98 % (10/02 0700) Arterial Line BP: (129-168)/(51-67) 160/60 (10/01 1600) Weight:  [83 kg] 83 kg (10/02 0600)  Filed Weights   07/20/19 0400 07/23/19 0500 07/24/19 0600  Weight: 82 kg 83.8 kg 83 kg    Weight change: -0.8 kg   Intake/Output from previous day: 10/01 0701 - 10/02 0700 In: 1164.6 [P.O.:600; I.V.:564.6] Out: 1725 [Urine:1725]  Current Meds: Scheduled Meds: . acetaminophen  1,000 mg Oral Q6H   Or  . acetaminophen (TYLENOL) oral liquid 160 mg/5 mL  1,000 mg Per Tube Q6H  . aspirin EC  325 mg Oral Daily   Or  . aspirin  324 mg Per Tube Daily  . atorvastatin  80 mg Oral q1800  . bisacodyl  10 mg Oral Daily   Or  . bisacodyl  10 mg Rectal Daily  . carvedilol  6.25 mg Oral BID WC  . Chlorhexidine Gluconate Cloth  6 each Topical Daily  . docusate sodium  200 mg Oral Daily  . enoxaparin (LOVENOX) injection  30 mg Subcutaneous QHS  . furosemide  40 mg Oral Daily  . gabapentin  100 mg Oral QHS  . insulin aspart  0-24 Units Subcutaneous Q4H  . insulin regular  0-10 Units Intravenous TID WC  . loratadine  10 mg Oral Daily  . losartan  25 mg Oral Daily  . mouth rinse  15 mL Mouth Rinse BID  . pantoprazole  40 mg Oral Daily  . sodium chloride flush  10-40  mL Intracatheter Q12H  . sodium chloride flush  3 mL Intravenous Q12H   Continuous Infusions: . sodium chloride    . sodium chloride    . sodium chloride    . insulin Stopped (07/23/19 0729)  . lactated ringers    . lactated ringers    . lactated ringers 10 mL/hr at 07/24/19 0700  . niCARDipine Stopped (07/23/19 1905)   PRN Meds:.sodium chloride, lactated ringers, metoprolol tartrate, midazolam, morphine injection, ondansetron (ZOFRAN) IV, oxyCODONE, sodium chloride flush, sodium chloride flush, traMADol  General appearance: alert, cooperative and no distress Heart: regular rate and rhythm Lungs: clear to auscultation bilaterally Abdomen: soft, non-tender; bowel sounds normal; no masses,  no organomegaly Extremities: edema trace-1+ Wound: clean and dry  Lab Results: CBC: Recent Labs    07/23/19 1546 07/24/19 0542  WBC 10.2 11.2*  HGB 11.0* 11.3*  HCT 32.1* 33.1*  PLT 160 179   BMET:  Recent Labs    07/23/19 1546 07/24/19 0542  NA 138 138  K 3.9 4.0  CL 107 103  CO2 23 24  GLUCOSE 113* 97  BUN 17 20  CREATININE 0.93 1.06  CALCIUM 7.8* 8.1*    CMET: Lab Results  Component Value Date   WBC 11.2 (H) 07/24/2019   HGB 11.3 (L) 07/24/2019   HCT 33.1 (L) 07/24/2019   PLT 179 07/24/2019   GLUCOSE 97 07/24/2019   ALT 299 (H) 07/22/2019   AST 98 (H) 07/22/2019   NA 138 07/24/2019   K 4.0 07/24/2019   CL 103 07/24/2019   CREATININE 1.06 07/24/2019   BUN 20 07/24/2019   CO2 24 07/24/2019   TSH 1.393 08/29/2015   INR 1.6 (H) 07/22/2019   HGBA1C 6.0 (H) 07/21/2019      PT/INR:  Recent Labs    07/22/19 1157  LABPROT 18.9*  INR 1.6*   Radiology: Dg Chest Port 1 View  Result Date: 07/24/2019 CLINICAL DATA:  Shortness of breath EXAM: PORTABLE CHEST 1 VIEW COMPARISON:  07/23/2019 FINDINGS: Right jugular sheath is again noted and stable. Swan-Ganz catheter has been removed in the interval. Central drain is again identified and stable. Left-sided chest tube is  noted and stable. No pneumothorax is seen. Persistent predominately asymmetric pulmonary edema is noted worse on the right than the left. Small right-sided effusion is noted. No other focal abnormality is seen. IMPRESSION: Relative stable appearance of the chest when compare with the previous day. Electronically Signed   By: Inez Catalina M.D.   On: 07/24/2019 07:12     Assessment/Plan: S/P Procedure(s): OFF PUMP CORONARY ARTERY BYPASS GRAFTING (CABG) times two using left internal mammary artery and right leg saphenous vein  1.CV- NSR, + HTN- on Cozaar, Coreg 2. Pulm- no acute issues, weaning oxygen as able, CXR with bilateral atelectasis, pulmonary edema... CT output is minimal possibly d/c today 3. Renal- creatinine is WNL, + pulmonary edema on CXR, weight elevated 12 lbs, will give IV Lasix today, supplement K 4. Expected post operative blood loss anemia, Hgb stable at 11.3 5. CBGS controlled, not a diabetic, will d/c SSIP 6. Dispo- patient stable, in NSR + HTN- will restart home Cozaar at reduced dose, start IV Lasix today, stop SSIP, PT/OT consult     [name] 07/24/2019 7:58 AM   Doing well Will start diuresis today Will remove chest tubes Continue ICU care today due to deconditioning Floor soon.  [name]

## 2019-07-24 NOTE — Progress Notes (Addendum)
Progress Note  Patient Name: Philip Hurst Date of Encounter: 07/24/2019  Primary Cardiologist: No primary care provider on file.   Subjective   Philip Hurst had an uneventful night. He was able to ambulated in the room and hallway without any difficulties. Only complained of feeling unsteady at the feet. Also endorses chest soreness. Denies shortness of breath, palpitation, headache, dizziness.   Inpatient Medications    Scheduled Meds: . acetaminophen  1,000 mg Oral Q6H   Or  . acetaminophen (TYLENOL) oral liquid 160 mg/5 mL  1,000 mg Per Tube Q6H  . aspirin EC  325 mg Oral Daily   Or  . aspirin  324 mg Per Tube Daily  . atorvastatin  80 mg Oral q1800  . bisacodyl  10 mg Oral Daily   Or  . bisacodyl  10 mg Rectal Daily  . carvedilol  6.25 mg Oral BID WC  . Chlorhexidine Gluconate Cloth  6 each Topical Daily  . docusate sodium  200 mg Oral Daily  . enoxaparin (LOVENOX) injection  30 mg Subcutaneous QHS  . gabapentin  100 mg Oral QHS  . insulin aspart  0-24 Units Subcutaneous Q4H  . insulin regular  0-10 Units Intravenous TID WC  . loratadine  10 mg Oral Daily  . mouth rinse  15 mL Mouth Rinse BID  . pantoprazole  40 mg Oral Daily  . sodium chloride flush  10-40 mL Intracatheter Q12H  . sodium chloride flush  3 mL Intravenous Q12H   Continuous Infusions: . sodium chloride    . sodium chloride    . sodium chloride    . insulin Stopped (07/23/19 0729)  . lactated ringers    . lactated ringers    . lactated ringers 10 mL/hr at 07/24/19 0000  . niCARDipine Stopped (07/23/19 1905)   PRN Meds: sodium chloride, lactated ringers, metoprolol tartrate, midazolam, morphine injection, ondansetron (ZOFRAN) IV, oxyCODONE, sodium chloride flush, sodium chloride flush, traMADol   Vital Signs    Vitals:   07/24/19 0200 07/24/19 0345 07/24/19 0400 07/24/19 0500  BP: 128/69  (!) 148/74 (!) 145/74  Pulse: 71  74 74  Resp: 18  17 20   Temp:  97.9 F (36.6 C)    TempSrc:  Oral     SpO2: 96%  96% 95%  Weight:      Height:        Intake/Output Summary (Last 24 hours) at 07/24/2019 0650 Last data filed at 07/24/2019 0024 Gross per 24 hour  Intake 1143.21 ml  Output 1850 ml  Net -706.79 ml   Filed Weights   07/19/19 1430 07/20/19 0400 07/23/19 0500  Weight: 79.6 kg 82 kg 83.8 kg    Telemetry    Occasional missed beats - Personally Reviewed  ECG    Pending  - Personally Reviewed  Physical Exam   GEN: No acute distress.   Neck: No JVD Cardiac: RRR, no murmurs, rubs, or gallops.  Respiratory: Minimal crackles at the right lung base GI: Soft, nontender, non-distended  MS: No edema; No deformity. Psych: Normal affect   Labs    Chemistry Recent Labs  Lab 07/18/19 0226  07/20/19 0221  07/22/19 0452  07/23/19 0328 07/23/19 1546 07/24/19 0542  NA 145   < > 140   < > 141   < > 139 138 138  K 3.7   < > 3.5   < > 3.6   < > 3.6 3.9 4.0  CL 119*   < >  109   < > 107   < > 108 107 103  CO2 17*   < > 18*   < > 23   < > 22 23 24   GLUCOSE 125*   < > 143*   < > 110*   < > 110* 113* 97  BUN 37*   < > 44*   < > 34*   < > 19 17 20   CREATININE 1.65*   < > 1.58*   < > 1.29*   < > 1.02 0.93 1.06  CALCIUM 8.1*   < > 8.2*   < > 8.0*   < > 7.8* 7.8* 8.1*  PROT  --   --   --   --  5.1*  --   --   --   --   ALBUMIN 2.3*  --  2.3*  --  2.1*  --   --   --   --   AST  --   --   --   --  98*  --   --   --   --   ALT  --   --   --   --  299*  --   --   --   --   ALKPHOS  --   --   --   --  69  --   --   --   --   BILITOT  --   --   --   --  0.3  --   --   --   --   GFRNONAA 39*   < > 41*   < > 53*   < > >60 >60 >60  GFRAA 45*   < > 48*   < > >60   < > >60 >60 >60  ANIONGAP 9   < > 13   < > 11   < > 9 8 11    < > = values in this interval not displayed.     Hematology Recent Labs  Lab 07/23/19 0328 07/23/19 1546 07/24/19 0542  WBC 9.3 10.2 11.2*  RBC 3.52* 3.47* 3.53*  HGB 10.9* 11.0* 11.3*  HCT 32.6* 32.1* 33.1*  MCV 92.6 92.5 93.8  MCH 31.0 31.7 32.0   MCHC 33.4 34.3 34.1  RDW 14.8 14.7 14.7  PLT 170 160 179    Cardiac EnzymesNo results for input(s): TROPONINI in the last 168 hours. No results for input(s): TROPIPOC in the last 168 hours.   BNPNo results for input(s): BNP, PROBNP in the last 168 hours.   DDimer No results for input(s): DDIMER in the last 168 hours.   Radiology    Dg Chest Port 1 View  Result Date: 07/23/2019 CLINICAL DATA:  Chest 2, status post CABG EXAM: PORTABLE CHEST 1 VIEW COMPARISON:  07/22/2019 FINDINGS: Swan-Ganz catheter with the tip projecting over the right ventricular outflow tract. Left-sided chest tube in unchanged position. Interval removal of the endotracheal tube. Interval removal of the nasogastric tube. Bilateral interstitial and patchy alveolar airspace opacities. No pleural effusion or pneumothorax. Stable cardiomediastinal silhouette. Interval CABG. No acute osseous abnormality. IMPRESSION: Persistent pulmonary edema. Swan-Ganz catheter in unchanged position with the tip projecting over the right ventricular outflow tract. Left-sided chest tube without a pneumothorax. Electronically Signed   By: Kathreen Devoid   On: 07/23/2019 08:02   Dg Chest Port 1 View  Result Date: 07/22/2019 CLINICAL DATA:  Status post coronary artery bypass surgery. EXAM: PORTABLE CHEST 1 VIEW COMPARISON:  07/19/2019 FINDINGS: The endotracheal tube is in good position, 4.3 cm above the carina. The NG tube is coursing down the esophagus and into the stomach. Right IJ Swan-Ganz catheter tip is in the main pulmonary artery. A left-sided chest tube is in place. No pneumothorax. Persistent asymmetric pattern of perihilar pulmonary edema and right pleural effusion. The bony thorax is intact. IMPRESSION: 1. Status post coronary artery bypass surgery with support apparatus in good position without complicating features. 2. Left-sided chest tube in good position without left-sided pneumothorax. 3. Asymmetric pattern of perihilar pulmonary edema  and streaky atelectasis along with a small right pleural effusion. Electronically Signed   By: Marijo Sanes M.D.   On: 07/22/2019 12:18    Cardiac Studies   07/19/2019 ECHO:  1. Left ventricular ejection fraction, by visual estimation, is 25%. The left ventricle has severely decreased function. There is no left ventricular hypertrophy. 2. Left ventricular diastolic Doppler parameters are indeterminate pattern of LV diastolic filling. 3. The anteroseptal wall and apex are akinetic. 4. Global right ventricle has normal systolic function.The right ventricular size is normal. No increase in right ventricular wall thickness. 5. Left atrial size was normal. 6. Right atrial size was normal. 7. The mitral valve is abnormal. No evidence of mitral valve regurgitation. No evidence of mitral stenosis. 8. The tricuspid valve is normal in structure. Tricuspid valve regurgitation was not visualized by color flow Doppler. 9. The aortic valve has an indeterminant number of cusps Aortic valve regurgitation was not visualized by color flow Doppler. Mild aortic valve sclerosis without stenosis. 10. The pulmonic valve was not well visualized. Pulmonic valve regurgitation is not visualized by color flow Doppler. 11. The inferior vena cava is normal in size with greater than 50% respiratory variability, suggesting right atrial pressure of 3 mmHg. 12. Acute decrease in LVEF and acute wall motion abnormalities as reported above compared to 07/17/19 echo  07/19/2019 LHC: 1. Acute systolic heart failure with precipitous drop in LVEF with anteroapical wall motion abnormality likely related to acute ischemia. Cannot totally exclude the possibility of a stress cardiomyopathy component. LVEDP is 20 mmHg. 2. Severe coronary artery disease with segmental 70 to 90% proximal to mid LAD stenosis. 3. 60% second obtuse marginal. The first marginal is small. 4. Total occlusion, flush, RCA with large collateral bed from  left circumflex to distal left ventricular branch.   Patient Profile     78 y.o. male w/ a PMHx significant for medically managed CAD (RCA disease), HTN, HLD and PAD who presented with AMS and imaging findings concerning for pneumonia with EKG without acute ischemic changes initially who then developed lateral TWIs and ST elevation in the anterior and lateral precordial leads with a troponemia up to 3396, a precipitous drop in LVEF from 55-60% on admission down to 25-30% with anteroapical wall motion abnormality who went for LHC showing severe CAD w/ segmental 70-90% proximal to mid LAD stenosis, 60% second obtuse marginal, total occlusion RCAon IV heparin, IV amiodorone for afib/aflutter (now in NSR) ultimately treated with CABG 07/22/2019  Assessment & Plan   POD#2   #Acute anterior wall MI: He was found to have severe multivessel disease and was initially treated with IV heparin and medical therapy followed by CABG x2.  Now postop day #2.  He has been off nicardipine infusion as of 7 PM last night.  Appreciate care of the cardiothoracic surgery team -Continue aspirin, Lipitor   #Acute systolic heart failure: This was secondary to his acute myocardial infarction and  ischemic heart disease.  He has now been revascularized and doing well postoperative.  He remains hemodynamically stable. CXR today consistent with pulmonary edema. Plan to give IV lasix today -Continue coreg, losartan  #Acute kidney injury: Renal function has improved.  Serum creatinine this a.m. of 1.06.  He is making adequate urine output with total U OP of 1.8 L.  #Acute blood loss anemia: Mild and normocytic when she has expected postop    For questions or updates, please contact [place] Please consult [email or web address] for contact info under Cardiology/STEMI.      Signed, Jean Rosenthal, MD  07/24/2019, 6:50 AM    Patient seen, examined. Available data reviewed. Agree with findings, assessment, and plan as outlined  by Nathanial Rancher, MD. On my exam: Vitals:   07/24/19 1000 07/24/19 1100  BP: 134/73   Pulse: 75   Resp: 19   Temp:  98 F (36.7 C)  SpO2: 98%    Pt is alert and oriented, elderly male in NAD HEENT: normal Neck: JVP - normal Lungs: CTA bilaterally CV: RRR without murmur or gallop Abd: soft, NT, Positive BS, no hepatomegaly Ext: mild diffuse edema, distal pulses intact and equal Skin: warm/dry no rash  Agree with plan as outlined above. Pt to be diuresed today. Losartan restarted. Continues on coreg. Heart rhythm stable in sinus. Progressing remarkably well post-CABG.   Sherren Mocha, M.D. 07/24/2019 11:46 AM

## 2019-07-24 NOTE — Evaluation (Signed)
Physical Therapy Evaluation Patient Details Name: Philip Hurst MRN: [id number] DOB: 1941-07-09 Today's Date: 07/24/2019   History of Present Illness  78 year old male presenting with sepsis, now with concern for PE vs ACS. Patient was taken to an urgent LHC 9/27 due to EKG changes overnight and rising troponins. Found to have total occlusion of RCA with collaterals from left circ. LAD 50-90%. CABG x 2 on 07/22/19  Clinical Impression  Pt admitted with above diagnosis. Pt was able to ambulate with [place] walker with min assist and cues. Pt needs cues for sternal precautions however anticipate he can return home with 24 hour care at d/c.   Pt currently with functional limitations due to the deficits listed below (see PT Problem List). Pt will benefit from skilled PT to increase their independence and safety with mobility to allow discharge to the venue listed below.    Follow Up Recommendations Home health PT;Supervision/Assistance - 24 hour    Equipment Recommendations  None recommended by PT    Recommendations for Other Services       Precautions / Restrictions Precautions Precautions: Fall;Sternal Precaution Booklet Issued: Yes (comment) Precaution Comments: 2 JP drains Restrictions Weight Bearing Restrictions: Yes(sternal)      Mobility  Bed Mobility Overal bed mobility: Needs Assistance Bed Mobility: Sit to Sidelying         Sit to sidelying: Min assist General bed mobility comments: cues for technique, assist to get LEs into bed  Transfers Overall transfer level: Needs assistance Equipment used: Rolling walker (2 wheeled) Transfers: Sit to/from Stand Sit to Stand: Min assist         General transfer comment: Pt able to sit to stand with hands on knees with cues.    Needs cues and steadying assist once on feet.  Ambulation/Gait Ambulation/Gait assistance: Min assist Gait Distance (Feet): 120 Feet Assistive device: (Eva walker) Gait Pattern/deviations: Step-through  pattern;Decreased stride length   Gait velocity interpretation: <1.31 ft/sec, indicative of household ambulator General Gait Details: slow pace, cues to take larger steps, assist to move Eva walker,  flexed posture with lines O2, etc. SpO2 on 4LO2 91% with ambulation, HR max 122.  Pt on2LO2 at rest  Stairs            Wheelchair Mobility    Modified Rankin (Stroke Patients Only)       Balance Overall balance assessment: Needs assistance Sitting-balance support: No upper extremity supported;Feet supported Sitting balance-Leahy Scale: Good     Standing balance support: Bilateral upper extremity supported;During functional activity Standing balance-Leahy Scale: Poor Standing balance comment: right now reliant on UE support for balance                             Pertinent Vitals/Pain Pain Assessment: Faces Faces Pain Scale: Hurts even more Pain Location: sternum Pain Descriptors / Indicators: Aching;Discomfort;Grimacing Pain Intervention(s): Limited activity within patient's tolerance;Monitored during session;Repositioned    Home Living Family/patient expects to be discharged to:: Private residence Living Arrangements: Spouse/significant other Available Help at Discharge: Family Type of Home: House Home Access: Level entry   Entrance Stairs-Number of Steps: basically a level entry Home Layout: One level Home Equipment: Shower seat - built in;Grab bars - tub/shower;Bedside commode;Walker - 2 wheels;Cane - single point Additional Comments: equipment was his mother-im-laws; thinks he has BSC, not sure    Prior Function Level of Independence: Independent         Comments: drove and  did chores, etc     Hand Dominance   Dominant Hand: Right    Extremity/Trunk Assessment   Upper Extremity Assessment Upper Extremity Assessment: Overall WFL for tasks assessed    Lower Extremity Assessment Lower Extremity Assessment: Generalized weakness     Cervical / Trunk Assessment Cervical / Trunk Assessment: Kyphotic  Communication   Communication: No difficulties  Cognition Arousal/Alertness: Awake/alert Behavior During Therapy: WFL for tasks assessed/performed Overall Cognitive Status: Within Functional Limits for tasks assessed                                        General Comments      Exercises General Exercises - Lower Extremity Ankle Circles/Pumps: AROM;Both;10 reps;Seated Long Arc Quad: AROM;Both;10 reps;Seated   Assessment/Plan    PT Assessment Patient needs continued PT services  PT Problem List Decreased strength;Decreased mobility;Decreased activity tolerance;Decreased balance;Decreased knowledge of use of DME;Pain;Decreased knowledge of precautions       PT Treatment Interventions DME instruction;Therapeutic activities;Patient/family education;Therapeutic exercise;Gait training;Balance training;Functional mobility training    PT Goals (Current goals can be found in the Care Plan section)  Acute Rehab PT Goals Patient Stated Goal: to return to independent PT Goal Formulation: With patient Time For Goal Achievement: 08/07/19 Potential to Achieve Goals: Good    Frequency Min 3X/week   Barriers to discharge        Co-evaluation               AM-PAC PT "6 Clicks" Mobility  Outcome Measure Help needed turning from your back to your side while in a flat bed without using bedrails?: A Little Help needed moving from lying on your back to sitting on the side of a flat bed without using bedrails?: A Little Help needed moving to and from a bed to a chair (including a wheelchair)?: A Little Help needed standing up from a chair using your arms (e.g., wheelchair or bedside chair)?: A Little Help needed to walk in hospital room?: A Little Help needed climbing 3-5 steps with a railing? : A Little 6 Click Score: 18    End of Session Equipment Utilized During Treatment: Oxygen;Gait  belt Activity Tolerance: Patient limited by fatigue Patient left: in chair;with call bell/phone within reach;with chair alarm set Nurse Communication: Mobility status PT Visit Diagnosis: Other abnormalities of gait and mobility (R26.89);Muscle weakness (generalized) (M62.81);Pain Pain - part of body: (sternum)    Time: [id number] PT Time Calculation (min) (ACUTE ONLY): 24 min   Charges:   PT Evaluation $PT Eval Moderate Complexity: 1 Mod PT Treatments $Gait Training: 8-22 mins        [place] Pager:  [phone]  Office:  [place] 07/24/2019, 12:16 PM

## 2019-07-24 NOTE — TOC Progression Note (Signed)
Transition of Care [facility]) - Progression Note    Patient Details  Name: Philip Hurst MRN: [id number] Date of Birth: 02/28/1941  [facility] CM/SW Contact  Graves-Bigelow, Ocie Cornfield, RN Phone Number: 07/24/2019, 12:57 PM  Clinical Narrative:   Post op CABG x 2 07-22-19. Pt in the room with daughter upon visit. CM did discuss [facility] Services with patient. Medicare.gov list presented to daughter and she will discuss with patient's spouse for agency of choice. Patient was unable to make a decision for [place] at this time. Pt has DME RW and Cane in the home. Patient uses [facility] for medications and PCP is Leonard Downing. PT recommendations for [facility] PT services, CM will follow for additional disciplines needed. The weekend CM will follow for additional transition of care needs.   Expected Discharge Plan: [place] Barriers to Discharge: Continued Medical Work up  Expected Discharge Plan and Services Expected Discharge Plan: [place] In-house Referral: NA Discharge Planning Services: CM Consult Post Acute Care Choice: [place] arrangements for the past 2 months: Single Family Home                  HH Arranged: PT     Social Determinants of Health (SDOH) Interventions    Readmission Risk Interventions Readmission Risk Prevention Plan 07/24/2019  Transportation Screening Complete  PCP or Specialist Appt within 3-5 Days (No Data)  [place] or [place] Complete  Social Work Consult for [place] Planning/Counseling Complete  Palliative Care Screening Not Applicable  Medication Review Press photographer) Complete  Some recent data might be hidden

## 2019-07-24 NOTE — Plan of Care (Signed)
  Problem: Education: Goal: Knowledge of General Education information will improve Description: Including pain rating scale, medication(s)/side effects and non-pharmacologic comfort measures Outcome: Progressing   Problem: Health Behavior/Discharge Planning: Goal: Ability to manage health-related needs will improve Outcome: Progressing   Problem: Clinical Measurements: Goal: Ability to maintain clinical measurements within normal limits will improve Outcome: Progressing Goal: Will remain free from infection Outcome: Progressing Goal: Diagnostic test results will improve Outcome: Progressing Goal: Respiratory complications will improve Outcome: Progressing Goal: Cardiovascular complication will be avoided Outcome: Progressing   Problem: Activity: Goal: Risk for activity intolerance will decrease Outcome: Progressing   Problem: Nutrition: Goal: Adequate nutrition will be maintained Outcome: Progressing   Problem: Coping: Goal: Level of anxiety will decrease Outcome: Progressing   Problem: Elimination: Goal: Will not experience complications related to urinary retention Outcome: Progressing   Problem: Safety: Goal: Ability to remain free from injury will improve Outcome: Progressing   Problem: Skin Integrity: Goal: Risk for impaired skin integrity will decrease Outcome: Progressing   Problem: Education: Goal: Will demonstrate proper wound care and an understanding of methods to prevent future damage Outcome: Progressing Goal: Knowledge of disease or condition will improve Outcome: Progressing Goal: Knowledge of the prescribed therapeutic regimen will improve Outcome: Progressing   Problem: Activity: Goal: Risk for activity intolerance will decrease Outcome: Progressing   Problem: Clinical Measurements: Goal: Postoperative complications will be avoided or minimized Outcome: Progressing   Problem: Respiratory: Goal: Respiratory status will improve Outcome:  Progressing   Problem: Skin Integrity: Goal: Wound healing without signs and symptoms of infection Outcome: Progressing   Problem: Urinary Elimination: Goal: Ability to achieve and maintain adequate renal perfusion and functioning will improve Outcome: Progressing   Problem: Clinical Measurements: Goal: Postoperative complications will be avoided or minimized Outcome: Progressing   Problem: Respiratory: Goal: Respiratory status will improve Outcome: Progressing

## 2019-07-25 ENCOUNTER — Inpatient Hospital Stay (HOSPITAL_COMMUNITY): Payer: Medicare Other

## 2019-07-25 DIAGNOSIS — Z951 Presence of aortocoronary bypass graft: Secondary | ICD-10-CM

## 2019-07-25 DIAGNOSIS — E782 Mixed hyperlipidemia: Secondary | ICD-10-CM

## 2019-07-25 DIAGNOSIS — I10 Essential (primary) hypertension: Secondary | ICD-10-CM

## 2019-07-25 LAB — CBC
HCT: 33.1 % — ABNORMAL LOW (ref 39.0–52.0)
Hemoglobin: 11.5 g/dL — ABNORMAL LOW (ref 13.0–17.0)
MCH: 32.6 pg (ref 26.0–34.0)
MCHC: 34.7 g/dL (ref 30.0–36.0)
MCV: 93.8 fL (ref 80.0–100.0)
Platelets: 189 10*3/uL (ref 150–400)
RBC: 3.53 MIL/uL — ABNORMAL LOW (ref 4.22–5.81)
RDW: 14.5 % (ref 11.5–15.5)
WBC: 13.3 10*3/uL — ABNORMAL HIGH (ref 4.0–10.5)
nRBC: 0 % (ref 0.0–0.2)

## 2019-07-25 LAB — GLUCOSE, CAPILLARY
Glucose-Capillary: 103 mg/dL — ABNORMAL HIGH (ref 70–99)
Glucose-Capillary: 104 mg/dL — ABNORMAL HIGH (ref 70–99)
Glucose-Capillary: 111 mg/dL — ABNORMAL HIGH (ref 70–99)
Glucose-Capillary: 111 mg/dL — ABNORMAL HIGH (ref 70–99)
Glucose-Capillary: 80 mg/dL (ref 70–99)
Glucose-Capillary: 83 mg/dL (ref 70–99)
Glucose-Capillary: 93 mg/dL (ref 70–99)

## 2019-07-25 LAB — BASIC METABOLIC PANEL
Anion gap: 10 (ref 5–15)
BUN: 24 mg/dL — ABNORMAL HIGH (ref 8–23)
CO2: 25 mmol/L (ref 22–32)
Calcium: 7.9 mg/dL — ABNORMAL LOW (ref 8.9–10.3)
Chloride: 103 mmol/L (ref 98–111)
Creatinine, Ser: 1.23 mg/dL (ref 0.61–1.24)
GFR calc Af Amer: >60 mL/min (ref >60)
GFR calc non Af Amer: 56 mL/min — ABNORMAL LOW (ref >60)
Glucose, Bld: 107 mg/dL — ABNORMAL HIGH (ref 70–99)
Potassium: 3.5 mmol/L (ref 3.5–5.1)
Sodium: 138 mmol/L (ref 135–145)

## 2019-07-25 MED ORDER — ONDANSETRON HCL 4 MG/2ML IJ SOLN
4.0000 mg | Freq: Once | INTRAMUSCULAR | Status: DC
  Filled 2019-07-25: qty 2

## 2019-07-25 MED ORDER — SACUBITRIL-VALSARTAN 24-26 MG PO TABS
1.0000 | ORAL_TABLET | Freq: Two times a day (BID) | ORAL | Status: DC
  Administered 2019-07-25 – 2019-07-28 (×6): 1 via ORAL
  Filled 2019-07-25 (×7): qty 1

## 2019-07-25 MED ORDER — POTASSIUM CHLORIDE CRYS ER 20 MEQ PO TBCR
20.0000 meq | EXTENDED_RELEASE_TABLET | ORAL | Status: AC
  Administered 2019-07-25 (×3): 20 meq via ORAL
  Filled 2019-07-25 (×3): qty 1

## 2019-07-25 NOTE — Progress Notes (Signed)
Progress Note  Patient Name: Philip Hurst Date of Encounter: 07/25/2019  Primary Cardiologist: Quay Burow, MD   Subjective   Feeling nauseous and weak.  Incisional pain with movement.  Denies shortness of breath.   Inpatient Medications    Scheduled Meds: . acetaminophen  1,000 mg Oral Q6H   Or  . acetaminophen (TYLENOL) oral liquid 160 mg/5 mL  1,000 mg Per Tube Q6H  . aspirin EC  325 mg Oral Daily   Or  . aspirin  324 mg Per Tube Daily  . atorvastatin  80 mg Oral q1800  . bisacodyl  10 mg Oral Daily   Or  . bisacodyl  10 mg Rectal Daily  . carvedilol  12.5 mg Oral BID WC  . Chlorhexidine Gluconate Cloth  6 each Topical Daily  . docusate sodium  200 mg Oral Daily  . enoxaparin (LOVENOX) injection  30 mg Subcutaneous QHS  . furosemide  40 mg Oral Daily  . gabapentin  100 mg Oral QHS  . insulin aspart  0-24 Units Subcutaneous Q4H  . loratadine  10 mg Oral Daily  . losartan  25 mg Oral Daily  . mouth rinse  15 mL Mouth Rinse BID  . pantoprazole  40 mg Oral Daily  . potassium chloride  20 mEq Oral Q4H  . sodium chloride flush  10-40 mL Intracatheter Q12H  . sodium chloride flush  3 mL Intravenous Q12H   Continuous Infusions: . sodium chloride    . sodium chloride    . sodium chloride    . lactated ringers    . lactated ringers    . lactated ringers 10 mL/hr at 07/24/19 1800   PRN Meds: sodium chloride, lactated ringers, metoprolol tartrate, morphine injection, ondansetron (ZOFRAN) IV, oxyCODONE, sodium chloride flush, sodium chloride flush, traMADol   Vital Signs    Vitals:   07/25/19 0530 07/25/19 0600 07/25/19 0700 07/25/19 0727  BP:  (!) 164/81 (!) 143/76   Pulse:  80 (!) 114   Resp: 15 (!) 21 17   Temp:    97.9 F (36.6 C)  TempSrc:    Oral  SpO2:  99% 90%   Weight:      Height:        Intake/Output Summary (Last 24 hours) at 07/25/2019 0914 Last data filed at 07/25/2019 0330 Gross per 24 hour  Intake 539.97 ml  Output 1700 ml  Net -1160.03  ml   Last 3 Weights 07/25/2019 07/24/2019 07/23/2019  Weight (lbs) 178 lb 5.6 oz 182 lb 15.7 oz 184 lb 11.9 oz  Weight (kg) 80.9 kg 83 kg 83.8 kg      Telemetry    Sinus rhythm.  Blocked PACs.- Personally Reviewed  ECG    Sinus rhythm.  Rate 85 bpm.  Nonspecific ST changes.- Personally Reviewed  Physical Exam   VS:  BP (!) 143/76   Pulse (!) 114   Temp 97.9 F (36.6 C) (Oral)   Resp 17   Ht 5\' 3"  (1.6 m)   Wt 80.9 kg   SpO2 90%   BMI 31.59 kg/m  , BMI Body mass index is 31.59 kg/m. GENERAL:  Ill-appearing.  No acute distress HEENT: Pupils equal round and reactive, fundi not visualized, oral mucosa unremarkable NECK:  No jugular venous distention, waveform within normal limits, carotid upstroke brisk and symmetric, no bruits CHEST: Midline sternal incision C/D/I.  No erythema LUNGS:  Clear to auscultation bilaterally HEART:  RRR.  PMI not displaced or sustained,S1 and  S2 within normal limits, no S3, no S4, no clicks, no rubs, no murmurs ABD:  Flat, positive bowel sounds normal in frequency in pitch, no bruits, no rebound, no guarding, no midline pulsatile mass, no hepatomegaly, no splenomegaly EXT:  2 plus pulses throughout, trace edema, no cyanosis no clubbing SKIN:  No rashes no nodules NEURO:  Cranial nerves II through XII grossly intact, motor grossly intact throughout PSYCH:  Cognitively intact, oriented to person place and time   Labs    High Sensitivity Troponin:   Recent Labs  Lab 07/17/19 0631 07/18/19 1243 07/18/19 2325 07/19/19 0900 07/20/19 0845  TROPONINIHS 1,370* 3,396* 3,206* 2,175* 941*      Chemistry Recent Labs  Lab 07/20/19 0221  07/22/19 0452  07/23/19 1546 07/24/19 0542 07/25/19 0221  NA 140   < > 141   < > 138 138 138  K 3.5   < > 3.6   < > 3.9 4.0 3.5  CL 109   < > 107   < > 107 103 103  CO2 18*   < > 23   < > 23 24 25   GLUCOSE 143*   < > 110*   < > 113* 97 107*  BUN 44*   < > 34*   < > 17 20 24*  CREATININE 1.58*   < > 1.29*   <  > 0.93 1.06 1.23  CALCIUM 8.2*   < > 8.0*   < > 7.8* 8.1* 7.9*  PROT  --   --  5.1*  --   --   --   --   ALBUMIN 2.3*  --  2.1*  --   --   --   --   AST  --   --  98*  --   --   --   --   ALT  --   --  299*  --   --   --   --   ALKPHOS  --   --  69  --   --   --   --   BILITOT  --   --  0.3  --   --   --   --   GFRNONAA 41*   < > 53*   < > >60 >60 56*  GFRAA 48*   < > >60   < > >60 >60 >60  ANIONGAP 13   < > 11   < > 8 11 10    < > = values in this interval not displayed.     Hematology Recent Labs  Lab 07/23/19 1546 07/24/19 0542 07/25/19 0221  WBC 10.2 11.2* 13.3*  RBC 3.47* 3.53* 3.53*  HGB 11.0* 11.3* 11.5*  HCT 32.1* 33.1* 33.1*  MCV 92.5 93.8 93.8  MCH 31.7 32.0 32.6  MCHC 34.3 34.1 34.7  RDW 14.7 14.7 14.5  PLT 160 179 189    BNPNo results for input(s): BNP, PROBNP in the last 168 hours.   DDimer No results for input(s): DDIMER in the last 168 hours.   Radiology    Dg Chest Port 1 View  Result Date: 07/24/2019 CLINICAL DATA:  Shortness of breath EXAM: PORTABLE CHEST 1 VIEW COMPARISON:  07/23/2019 FINDINGS: Right jugular sheath is again noted and stable. Swan-Ganz catheter has been removed in the interval. Central drain is again identified and stable. Left-sided chest tube is noted and stable. No pneumothorax is seen. Persistent predominately asymmetric pulmonary edema is noted worse on the right than the left.  Small right-sided effusion is noted. No other focal abnormality is seen. IMPRESSION: Relative stable appearance of the chest when compare with the previous day. Electronically Signed   By: Inez Catalina M.D.   On: 07/24/2019 07:12    Cardiac Studies   07/19/2019 ECHO:  1. Left ventricular ejection fraction, by visual estimation, is 25%. The left ventricle has severely decreased function. There is no left ventricular hypertrophy. 2. Left ventricular diastolic Doppler parameters are indeterminate pattern of LV diastolic filling. 3. The anteroseptal wall and  apex are akinetic. 4. Global right ventricle has normal systolic function.The right ventricular size is normal. No increase in right ventricular wall thickness. 5. Left atrial size was normal. 6. Right atrial size was normal. 7. The mitral valve is abnormal. No evidence of mitral valve regurgitation. No evidence of mitral stenosis. 8. The tricuspid valve is normal in structure. Tricuspid valve regurgitation was not visualized by color flow Doppler. 9. The aortic valve has an indeterminant number of cusps Aortic valve regurgitation was not visualized by color flow Doppler. Mild aortic valve sclerosis without stenosis. 10. The pulmonic valve was not well visualized. Pulmonic valve regurgitation is not visualized by color flow Doppler. 11. The inferior vena cava is normal in size with greater than 50% respiratory variability, suggesting right atrial pressure of 3 mmHg. 12. Acute decrease in LVEF and acute wall motion abnormalities as reported above compared to 07/17/19 echo  07/19/2019 LHC: 1. Acute systolic heart failure with precipitous drop in LVEF with anteroapical wall motion abnormality likely related to acute ischemia. Cannot totally exclude the possibility of a stress cardiomyopathy component. LVEDP is 20 mmHg. 2. Severe coronary artery disease with segmental 70 to 90% proximal to mid LAD stenosis. 3. 60% second obtuse marginal. The first marginal is small. 4. Total occlusion, flush, RCA with large collateral bed from left circumflex to distal left ventricular branch.  Patient Profile     78 y.o. male with medically managed CAD, hypertension, hyperlipidemia, and PAD admitted with altered mental status concerning for pneumonia.  While in the hospital he developed anterior ST elevation myocardial infarction.  His LVEF reduced from 55 to 60% on admission to 25 to 30% with his STEMI.  Left heart catheter revealed disease in the proximal to mid LAD, OM 2, and total occlusion of the RCA.   He underwent CABG on 07/22/2019.  Assessment & Plan    # STEMI:  # Hyperlipidemia: Anterior STEMI during hospitalization.  CABG 07/22/19.  Continue aspirin, atorvastatin, carvedilol.    # Acute systolic and diastolic heart failure: CXR today improved with diuresis. Yesterday he was net -1.2L.  He is currently getting Lasix 40 mg IV daily.  We will switch to oral.  Continue carvedilol and losartan.  At home he was on losartan 100 mg daily.  We will switch this to [facility] given his reduced systolic function.  He will need a repeat echocardiogram in 3 months to assess for improvement in LVEF.  # AKI:  Today he looks much more euvolemic and chest x-ray is improving.  BUN is slightly elevated and creatinine is rising.  Agree with switching to oral Lasix as above.  We may need to reduce the Lasix even more given that we are starting Entresto.      For questions or updates, please contact [place] Please consult [email or web address] for contact info under        Signed, Skeet Latch, MD  07/25/2019, 9:14 AM

## 2019-07-25 NOTE — Progress Notes (Signed)
Patient ID: Philip Hurst, male   DOB: January 08, 1941, 78 y.o.   MRN: [id number] EVENING ROUNDS NOTE :     [address]       [place] [address]             [phone]                 3 Days Post-Op Procedure(s): OFF PUMP CORONARY ARTERY BYPASS GRAFTING (CABG) times two using left internal mammary artery and right leg saphenous vein  Total Length of Stay:  LOS: 9 days  BP 137/78   Pulse 80   Temp 98.4 F (36.9 C) (Oral)   Resp 15   Ht 5\' 3"  (1.6 m)   Wt 80.9 kg   SpO2 96%   BMI 31.59 kg/m   .Intake/Output      10/02 0701 - 10/03 0700 10/03 0701 - 10/04 0700   P.O. 810 120   I.V. (mL/kg) 110 (1.4)    Other     Total Intake(mL/kg) 920 (11.4) 120 (1.5)   Urine (mL/kg/hr) 1800 (0.9) 201 (0.2)   Emesis/NG output     Stool 0 1   Chest Tube 200    Total Output 2000 202   Net -1080 -82        Urine Occurrence 3 x 3 x   Stool Occurrence 1 x 3 x     . sodium chloride    . sodium chloride    . sodium chloride    . lactated ringers    . lactated ringers    . lactated ringers 10 mL/hr at 07/24/19 1800     Lab Results  Component Value Date   WBC 13.3 (H) 07/25/2019   HGB 11.5 (L) 07/25/2019   HCT 33.1 (L) 07/25/2019   PLT 189 07/25/2019   GLUCOSE 107 (H) 07/25/2019   ALT 299 (H) 07/22/2019   AST 98 (H) 07/22/2019   NA 138 07/25/2019   K 3.5 07/25/2019   CL 103 07/25/2019   CREATININE 1.23 07/25/2019   BUN 24 (H) 07/25/2019   CO2 25 07/25/2019   TSH 1.393 08/29/2015   INR 1.6 (H) 07/22/2019   HGBA1C 6.0 (H) 07/21/2019   Stable , more cooperative then this am   [place] [phone] Office [phone] 07/25/2019 5:54 PM

## 2019-07-25 NOTE — TOC Progression Note (Addendum)
Transition of Care [facility]) - Progression Note    Patient Details  Name: Philip Hurst MRN: [id number] Date of Birth: 1940/11/08  Transition of Care [facility]) CM/SW Contact  Zenon Mayo, RN Phone Number: 07/25/2019, 12:44 PM  Clinical Narrative:    From home with wife, NCM received call back from wife, stating she would like HHPT with [facility], referral given to [facility], he is able to take referral.  Soc will begin 24-48 hrs post dc. Will need HHPT order with face to face prior to dc.   Expected Discharge Plan: [place] Barriers to Discharge: No Barriers Identified  Expected Discharge Plan and Services Expected Discharge Plan: [place] In-house Referral: NA Discharge Planning Services: CM Consult Post Acute Care Choice: [place] arrangements for the past 2 months: Single Family Home                 DME Arranged: (NA)         HH Arranged: PT HH Agency: [place] Date [facility] Agency Contacted: 07/25/19 Time [place]: [phone] Representative spoke with at [place]: [place]) Interventions    Readmission Risk Interventions Readmission Risk Prevention Plan 07/24/2019  Transportation Screening Complete  PCP or Specialist Appt within 3-5 Days (No Data)  [place] or [place] Complete  Social Work Consult for [place] Planning/Counseling [place] Not Applicable  Medication Review Press photographer) Complete  Some recent data might be hidden

## 2019-07-25 NOTE — Progress Notes (Signed)
Patient ID: Philip Hurst, male   DOB: Mar 24, 1941, 78 y.o.   MRN: [id number] TCTS DAILY ICU PROGRESS NOTE                   [address]            [place] [address]          [phone]   3 Days Post-Op Procedure(s): OFF PUMP CORONARY ARTERY BYPASS GRAFTING (CABG) times two using left internal mammary artery and right leg saphenous vein  Total Length of Stay:  LOS: 9 days   Subjective: Patient mildly confused, tries to get out of bed and walk by himself  Objective: Vital signs in last 24 hours: Temp:  [97.5 F (36.4 C)-98.4 F (36.9 C)] 97.9 F (36.6 C) (10/03 0727) Pulse Rate:  [68-114] 114 (10/03 0700) Cardiac Rhythm: Normal sinus rhythm (10/02 2000) Resp:  [14-24] 17 (10/03 0700) BP: (125-166)/(67-89) 143/76 (10/03 0700) SpO2:  [90 %-100 %] 90 % (10/03 0700) Weight:  [80.9 kg] 80.9 kg (10/03 0500)  Filed Weights   07/23/19 0500 07/24/19 0600 07/25/19 0500  Weight: 83.8 kg 83 kg 80.9 kg    Weight change: -2.1 kg   Hemodynamic parameters for last 24 hours:    Intake/Output from previous day: 10/02 0701 - 10/03 0700 In: 800 [P.O.:690; I.V.:110] Out: 2000 [Urine:1800; Chest Tube:200]  Intake/Output this shift: No intake/output data recorded.  Current Meds: Scheduled Meds: . acetaminophen  1,000 mg Oral Q6H   Or  . acetaminophen (TYLENOL) oral liquid 160 mg/5 mL  1,000 mg Per Tube Q6H  . aspirin EC  325 mg Oral Daily   Or  . aspirin  324 mg Per Tube Daily  . atorvastatin  80 mg Oral q1800  . bisacodyl  10 mg Oral Daily   Or  . bisacodyl  10 mg Rectal Daily  . carvedilol  12.5 mg Oral BID WC  . Chlorhexidine Gluconate Cloth  6 each Topical Daily  . docusate sodium  200 mg Oral Daily  . enoxaparin (LOVENOX) injection  30 mg Subcutaneous QHS  . furosemide  40 mg Oral Daily  . gabapentin  100 mg Oral QHS  . insulin aspart  0-24 Units Subcutaneous Q4H  . loratadine  10 mg Oral Daily  . losartan  25 mg Oral Daily  . mouth rinse  15 mL Mouth  Rinse BID  . pantoprazole  40 mg Oral Daily  . potassium chloride  20 mEq Oral Q4H  . sodium chloride flush  10-40 mL Intracatheter Q12H  . sodium chloride flush  3 mL Intravenous Q12H   Continuous Infusions: . sodium chloride    . sodium chloride    . sodium chloride    . lactated ringers    . lactated ringers    . lactated ringers 10 mL/hr at 07/24/19 1800   PRN Meds:.sodium chloride, lactated ringers, metoprolol tartrate, morphine injection, ondansetron (ZOFRAN) IV, oxyCODONE, sodium chloride flush, sodium chloride flush, traMADol  General appearance: alert Neurologic: intact Heart: regular rate and rhythm, S1, S2 normal, no murmur, click, rub or gallop Lungs: diminished breath sounds bibasilar Abdomen: soft, non-tender; bowel sounds normal; no masses,  no organomegaly Extremities: extremities normal, atraumatic, no cyanosis or edema and Homans sign is negative, no sign of DVT Wound: Sternum intact  Lab Results: CBC: Recent Labs    07/24/19 0542 07/25/19 0221  WBC 11.2* 13.3*  HGB 11.3* 11.5*  HCT 33.1* 33.1*  PLT 179 189  BMET:  Recent Labs    07/24/19 0542 07/25/19 0221  NA 138 138  K 4.0 3.5  CL 103 103  CO2 24 25  GLUCOSE 97 107*  BUN 20 24*  CREATININE 1.06 1.23  CALCIUM 8.1* 7.9*    CMET: Lab Results  Component Value Date   WBC 13.3 (H) 07/25/2019   HGB 11.5 (L) 07/25/2019   HCT 33.1 (L) 07/25/2019   PLT 189 07/25/2019   GLUCOSE 107 (H) 07/25/2019   ALT 299 (H) 07/22/2019   AST 98 (H) 07/22/2019   NA 138 07/25/2019   K 3.5 07/25/2019   CL 103 07/25/2019   CREATININE 1.23 07/25/2019   BUN 24 (H) 07/25/2019   CO2 25 07/25/2019   TSH 1.393 08/29/2015   INR 1.6 (H) 07/22/2019   HGBA1C 6.0 (H) 07/21/2019      PT/INR:  Recent Labs    07/22/19 1157  LABPROT 18.9*  INR 1.6*   Radiology: No results found.   Assessment/Plan: S/P Procedure(s): OFF PUMP CORONARY ARTERY BYPASS GRAFTING (CABG) times two using left internal mammary artery  and right leg saphenous vein Mobilize Diuresis Mild elevation of LFTs Renal function stable Mild confusion but reorients quickly Needs aggressive physical therapy with underlying deconditioning    Grace Isaac 07/25/2019 8:04 AM

## 2019-07-25 NOTE — TOC Progression Note (Addendum)
Transition of Care [facility]) - Progression Note    Patient Details  Name: Philip Hurst MRN: [id number] Date of Birth: January 09, 1941  Transition of Care [facility]) CM/SW Contact  Zenon Mayo, RN Phone Number: 07/25/2019, 9:46 AM  Clinical Narrative:    NCM left message for wife to return call for choice of agency for HHPT. Awaiting call back.  NCM received call back from wife, she states she would like to go with Alvis Lemmings for HHPT, NCM made referral to [facility] with Alvis Lemmings, awaiting call back to see if he can take referral.   Expected Discharge Plan: [place] Barriers to Discharge: Continued Medical Work up  Expected Discharge Plan and Services Expected Discharge Plan: [place] In-house Referral: NA Discharge Planning Services: CM Consult Post Acute Care Choice: [place] arrangements for the past 2 months: Single Family Home                           HH Arranged: PT           Social Determinants of Health (SDOH) Interventions    Readmission Risk Interventions Readmission Risk Prevention Plan 07/24/2019  Transportation Screening Complete  PCP or Specialist Appt within 3-5 Days (No Data)  [place] or [place] Complete  Social Work Consult for [place] Planning/Counseling Complete  Palliative Care Screening Not Applicable  Medication Review Press photographer) Complete  Some recent data might be hidden

## 2019-07-26 ENCOUNTER — Inpatient Hospital Stay (HOSPITAL_COMMUNITY): Payer: Medicare Other

## 2019-07-26 LAB — CBC
HCT: 37.1 % — ABNORMAL LOW (ref 39.0–52.0)
Hemoglobin: 12.1 g/dL — ABNORMAL LOW (ref 13.0–17.0)
MCH: 31.1 pg (ref 26.0–34.0)
MCHC: 32.6 g/dL (ref 30.0–36.0)
MCV: 95.4 fL (ref 80.0–100.0)
Platelets: 248 10*3/uL (ref 150–400)
RBC: 3.89 MIL/uL — ABNORMAL LOW (ref 4.22–5.81)
RDW: 14.3 % (ref 11.5–15.5)
WBC: 12.8 10*3/uL — ABNORMAL HIGH (ref 4.0–10.5)
nRBC: 0 % (ref 0.0–0.2)

## 2019-07-26 LAB — GLUCOSE, CAPILLARY
Glucose-Capillary: 84 mg/dL (ref 70–99)
Glucose-Capillary: 99 mg/dL (ref 70–99)
Glucose-Capillary: 108 mg/dL — ABNORMAL HIGH (ref 70–99)
Glucose-Capillary: 136 mg/dL — ABNORMAL HIGH (ref 70–99)
Glucose-Capillary: 159 mg/dL — ABNORMAL HIGH (ref 70–99)

## 2019-07-26 LAB — BASIC METABOLIC PANEL
Anion gap: 16 — ABNORMAL HIGH (ref 5–15)
BUN: 22 mg/dL (ref 8–23)
CO2: 23 mmol/L (ref 22–32)
Calcium: 8.1 mg/dL — ABNORMAL LOW (ref 8.9–10.3)
Chloride: 100 mmol/L (ref 98–111)
Creatinine, Ser: 1.38 mg/dL — ABNORMAL HIGH (ref 0.61–1.24)
GFR calc Af Amer: 56 mL/min — ABNORMAL LOW (ref >60)
GFR calc non Af Amer: 49 mL/min — ABNORMAL LOW (ref >60)
Glucose, Bld: 95 mg/dL (ref 70–99)
Potassium: 3.6 mmol/L (ref 3.5–5.1)
Sodium: 139 mmol/L (ref 135–145)

## 2019-07-26 MED ORDER — CARVEDILOL 12.5 MG PO TABS
12.5000 mg | ORAL_TABLET | Freq: Once | ORAL | Status: AC
  Administered 2019-07-26: 12.5 mg via ORAL
  Filled 2019-07-26: qty 1

## 2019-07-26 MED ORDER — POTASSIUM CHLORIDE CRYS ER 20 MEQ PO TBCR
20.0000 meq | EXTENDED_RELEASE_TABLET | ORAL | Status: AC
  Administered 2019-07-26 (×3): 20 meq via ORAL
  Filled 2019-07-26 (×3): qty 1

## 2019-07-26 MED ORDER — GERHARDT'S BUTT CREAM
TOPICAL_CREAM | CUTANEOUS | Status: DC | PRN
  Administered 2019-07-26 – 2019-07-27 (×2): via TOPICAL
  Filled 2019-07-26: qty 1

## 2019-07-26 MED ORDER — CARVEDILOL 25 MG PO TABS
25.0000 mg | ORAL_TABLET | Freq: Two times a day (BID) | ORAL | Status: DC
  Administered 2019-07-26 – 2019-07-29 (×6): 25 mg via ORAL
  Filled 2019-07-26 (×6): qty 1

## 2019-07-26 NOTE — Progress Notes (Signed)
Patient ID: Philip Hurst, male   DOB: Oct 02, 1941, 78 y.o.   MRN: [id number] EVENING ROUNDS NOTE :     [address]       [place] [address]             [phone]                 4 Days Post-Op Procedure(s): OFF PUMP CORONARY ARTERY BYPASS GRAFTING (CABG) times two using left internal mammary artery and right leg saphenous vein  Total Length of Stay:  LOS: 10 days  BP (!) 84/59   Pulse 70   Temp 98.1 F (36.7 C) (Oral)   Resp 13   Ht 5\' 3"  (1.6 m)   Wt 79 kg   SpO2 98%   BMI 30.85 kg/m   .Intake/Output      10/04 0701 - 10/05 0700   P.O. 300   Total Intake(mL/kg) 300 (3.8)   Urine (mL/kg/hr)    Stool    Total Output    Net +300       Urine Occurrence 2 x   Stool Occurrence 5 x     . sodium chloride    . sodium chloride    . sodium chloride    . lactated ringers    . lactated ringers    . lactated ringers 10 mL/hr at 07/24/19 1800     Lab Results  Component Value Date   WBC 12.8 (H) 07/26/2019   HGB 12.1 (L) 07/26/2019   HCT 37.1 (L) 07/26/2019   PLT 248 07/26/2019   GLUCOSE 95 07/26/2019   ALT 299 (H) 07/22/2019   AST 98 (H) 07/22/2019   NA 139 07/26/2019   K 3.6 07/26/2019   CL 100 07/26/2019   CREATININE 1.38 (H) 07/26/2019   BUN 22 07/26/2019   CO2 23 07/26/2019   TSH 1.393 08/29/2015   INR 1.6 (H) 07/22/2019   HGBA1C 6.0 (H) 07/21/2019   Stable day   Grace Isaac MD  [place] [phone] Office [phone] 07/26/2019 7:13 PM

## 2019-07-26 NOTE — Progress Notes (Signed)
Patient ID: Philip Hurst, male   DOB: 07-02-1941, 78 y.o.   MRN: [id number] TCTS DAILY ICU PROGRESS NOTE                   [address]            [place] [address]          [phone]   4 Days Post-Op Procedure(s): OFF PUMP CORONARY ARTERY BYPASS GRAFTING (CABG) times two using left internal mammary artery and right leg saphenous vein  Total Length of Stay:  LOS: 10 days   Subjective: More cooperative today, walked short distances with help  Objective: Vital signs in last 24 hours: Temp:  [97.7 F (36.5 C)-98.5 F (36.9 C)] 98.5 F (36.9 C) (10/04 0748) Pulse Rate:  [69-82] 73 (10/04 0700) Cardiac Rhythm: Normal sinus rhythm (10/04 0345) Resp:  [12-26] 14 (10/04 0700) BP: (109-168)/(63-81) 153/81 (10/04 0700) SpO2:  [94 %-100 %] 96 % (10/04 0700) Weight:  [79 kg] 79 kg (10/04 0650)  Filed Weights   07/24/19 0600 07/25/19 0500 07/26/19 0650  Weight: 83 kg 80.9 kg 79 kg    Weight change: -1.9 kg   Hemodynamic parameters for last 24 hours:    Intake/Output from previous day: 10/03 0701 - 10/04 0700 In: 240 [P.O.:240] Out: 402 [Urine:401; Stool:1]  Intake/Output this shift: No intake/output data recorded.  Current Meds: Scheduled Meds: . acetaminophen  1,000 mg Oral Q6H   Or  . acetaminophen (TYLENOL) oral liquid 160 mg/5 mL  1,000 mg Per Tube Q6H  . aspirin EC  325 mg Oral Daily   Or  . aspirin  324 mg Per Tube Daily  . atorvastatin  80 mg Oral q1800  . bisacodyl  10 mg Oral Daily   Or  . bisacodyl  10 mg Rectal Daily  . carvedilol  12.5 mg Oral BID WC  . Chlorhexidine Gluconate Cloth  6 each Topical Daily  . docusate sodium  200 mg Oral Daily  . enoxaparin (LOVENOX) injection  30 mg Subcutaneous QHS  . furosemide  40 mg Oral Daily  . gabapentin  100 mg Oral QHS  . insulin aspart  0-24 Units Subcutaneous Q4H  . loratadine  10 mg Oral Daily  . mouth rinse  15 mL Mouth Rinse BID  . ondansetron (ZOFRAN) IV  4 mg Intravenous Once  .  pantoprazole  40 mg Oral Daily  . potassium chloride  20 mEq Oral Q4H  . sacubitril-valsartan  1 tablet Oral BID  . sodium chloride flush  10-40 mL Intracatheter Q12H  . sodium chloride flush  3 mL Intravenous Q12H   Continuous Infusions: . sodium chloride    . sodium chloride    . sodium chloride    . lactated ringers    . lactated ringers    . lactated ringers 10 mL/hr at 07/24/19 1800   PRN Meds:.sodium chloride, lactated ringers, metoprolol tartrate, morphine injection, ondansetron (ZOFRAN) IV, oxyCODONE, sodium chloride flush, sodium chloride flush, traMADol  General appearance: alert, cooperative and no distress Neurologic: intact Heart: regular rate and rhythm, S1, S2 normal, no murmur, click, rub or gallop Lungs: diminished breath sounds bibasilar Abdomen: soft, non-tender; bowel sounds normal; no masses,  no organomegaly Extremities: extremities normal, atraumatic, no cyanosis or edema and Homans sign is negative, no sign of DVT Wound: Sternum stable  Lab Results: CBC: Recent Labs    07/25/19 0221 07/26/19 0220  WBC 13.3* 12.8*  HGB 11.5* 12.1*  HCT  33.1* 37.1*  PLT 189 248   BMET:  Recent Labs    07/25/19 0221 07/26/19 0220  NA 138 139  K 3.5 3.6  CL 103 100  CO2 25 23  GLUCOSE 107* 95  BUN 24* 22  CREATININE 1.23 1.38*  CALCIUM 7.9* 8.1*    CMET: Lab Results  Component Value Date   WBC 12.8 (H) 07/26/2019   HGB 12.1 (L) 07/26/2019   HCT 37.1 (L) 07/26/2019   PLT 248 07/26/2019   GLUCOSE 95 07/26/2019   ALT 299 (H) 07/22/2019   AST 98 (H) 07/22/2019   NA 139 07/26/2019   K 3.6 07/26/2019   CL 100 07/26/2019   CREATININE 1.38 (H) 07/26/2019   BUN 22 07/26/2019   CO2 23 07/26/2019   TSH 1.393 08/29/2015   INR 1.6 (H) 07/22/2019   HGBA1C 6.0 (H) 07/21/2019      PT/INR: No results for input(s): LABPROT, INR in the last 72 hours. Radiology: Dg Chest Port 1 View  Result Date: 07/26/2019 CLINICAL DATA:  Recent CABG EXAM: PORTABLE CHEST 1  VIEW COMPARISON:  07/25/2019 FINDINGS: Bilateral interstitial thickening with patchy lower lobe alveolar airspace opacities. No pleural effusion or pneumothorax. Stable cardiomegaly. Prior CABG. No acute osseous abnormality. IMPRESSION: Bilateral lower lobe airspace disease concerning for atelectasis versus pneumonia. Electronically Signed   By: Kathreen Devoid   On: 07/26/2019 07:25     Assessment/Plan: S/P Procedure(s): OFF PUMP CORONARY ARTERY BYPASS GRAFTING (CABG) times two using left internal mammary artery and right leg saphenous vein Mobilize Diuresis Slowly improving Work on pulmonary toilet Possible to stepdown tomorrow    Grace Isaac 07/26/2019 8:01 AM

## 2019-07-26 NOTE — Progress Notes (Signed)
Assisted tele visit to patient with family member.  Philip Hurst M, RN  

## 2019-07-26 NOTE — Progress Notes (Signed)
Progress Note  Patient Name: Philip Hurst Date of Encounter: 07/26/2019  Primary Cardiologist: Quay Burow, MD   Subjective    Feeling well.  Less nauseous today.  He still has incisional chest pain with movement.  No chest pain with walking.  Frustrated that he is so tired when he tries to walk.  Inpatient Medications    Scheduled Meds: . acetaminophen  1,000 mg Oral Q6H   Or  . acetaminophen (TYLENOL) oral liquid 160 mg/5 mL  1,000 mg Per Tube Q6H  . aspirin EC  325 mg Oral Daily   Or  . aspirin  324 mg Per Tube Daily  . atorvastatin  80 mg Oral q1800  . bisacodyl  10 mg Oral Daily   Or  . bisacodyl  10 mg Rectal Daily  . carvedilol  12.5 mg Oral BID WC  . Chlorhexidine Gluconate Cloth  6 each Topical Daily  . docusate sodium  200 mg Oral Daily  . enoxaparin (LOVENOX) injection  30 mg Subcutaneous QHS  . furosemide  40 mg Oral Daily  . gabapentin  100 mg Oral QHS  . insulin aspart  0-24 Units Subcutaneous Q4H  . loratadine  10 mg Oral Daily  . mouth rinse  15 mL Mouth Rinse BID  . ondansetron (ZOFRAN) IV  4 mg Intravenous Once  . pantoprazole  40 mg Oral Daily  . potassium chloride  20 mEq Oral Q4H  . sacubitril-valsartan  1 tablet Oral BID  . sodium chloride flush  10-40 mL Intracatheter Q12H  . sodium chloride flush  3 mL Intravenous Q12H   Continuous Infusions: . sodium chloride    . sodium chloride    . sodium chloride    . lactated ringers    . lactated ringers    . lactated ringers 10 mL/hr at 07/24/19 1800   PRN Meds: sodium chloride, lactated ringers, metoprolol tartrate, morphine injection, ondansetron (ZOFRAN) IV, oxyCODONE, sodium chloride flush, sodium chloride flush, traMADol   Vital Signs    Vitals:   07/26/19 0600 07/26/19 0650 07/26/19 0700 07/26/19 0748  BP: 140/74  (!) 153/81   Pulse: 69  73   Resp: (!) 22  14   Temp:    98.5 F (36.9 C)  TempSrc:    Oral  SpO2: 95%  96%   Weight:  79 kg    Height:        Intake/Output Summary  (Last 24 hours) at 07/26/2019 0829 Last data filed at 07/26/2019 0530 Gross per 24 hour  Intake 240 ml  Output 402 ml  Net -162 ml   Last 3 Weights 07/26/2019 07/25/2019 07/24/2019  Weight (lbs) 174 lb 2.6 oz 178 lb 5.6 oz 182 lb 15.7 oz  Weight (kg) 79 kg 80.9 kg 83 kg      Telemetry    Sinus rhythm.  Blocked PACs.- Personally Reviewed  ECG    Sinus rhythm.  Rate 85 bpm.  Nonspecific ST changes.- Personally Reviewed  Physical Exam   VS:  BP (!) 153/81   Pulse 73   Temp 98.5 F (36.9 C) (Oral)   Resp 14   Ht 5\' 3"  (1.6 m)   Wt 79 kg   SpO2 96%   BMI 30.85 kg/m  , BMI Body mass index is 30.85 kg/m. GENERAL:  Ill-appearing.  No acute distress.  Lying flat in bed. HEENT: Pupils equal round and reactive, fundi not visualized, oral mucosa unremarkable NECK:  No jugular venous distention, waveform within normal  limits, carotid upstroke brisk and symmetric, no bruits CHEST: Midline sternal incision C/D/I.  No erythema LUNGS:  Clear to auscultation bilaterally HEART:  RRR.  PMI not displaced or sustained,S1 and S2 within normal limits, no S3, no S4, no clicks, no rubs, no murmurs ABD:  Flat, positive bowel sounds normal in frequency in pitch, no bruits, no rebound, no guarding, no midline pulsatile mass, no hepatomegaly, no splenomegaly EXT:  2 plus pulses throughout, trace edema, no cyanosis no clubbing SKIN:  No rashes no nodules NEURO:  Cranial nerves II through XII grossly intact, motor grossly intact throughout PSYCH:  Cognitively intact, oriented to person place and time   Labs    High Sensitivity Troponin:   Recent Labs  Lab 07/17/19 0631 07/18/19 1243 07/18/19 2325 07/19/19 0900 07/20/19 0845  TROPONINIHS 1,370* 3,396* 3,206* 2,175* 941*      Chemistry Recent Labs  Lab 07/20/19 0221  07/22/19 0452  07/24/19 0542 07/25/19 0221 07/26/19 0220  NA 140   < > 141   < > 138 138 139  K 3.5   < > 3.6   < > 4.0 3.5 3.6  CL 109   < > 107   < > 103 103 100  CO2  18*   < > 23   < > 24 25 23   GLUCOSE 143*   < > 110*   < > 97 107* 95  BUN 44*   < > 34*   < > 20 24* 22  CREATININE 1.58*   < > 1.29*   < > 1.06 1.23 1.38*  CALCIUM 8.2*   < > 8.0*   < > 8.1* 7.9* 8.1*  PROT  --   --  5.1*  --   --   --   --   ALBUMIN 2.3*  --  2.1*  --   --   --   --   AST  --   --  98*  --   --   --   --   ALT  --   --  299*  --   --   --   --   ALKPHOS  --   --  69  --   --   --   --   BILITOT  --   --  0.3  --   --   --   --   GFRNONAA 41*   < > 53*   < > >60 56* 49*  GFRAA 48*   < > >60   < > >60 >60 56*  ANIONGAP 13   < > 11   < > 11 10 16*   < > = values in this interval not displayed.     Hematology Recent Labs  Lab 07/24/19 0542 07/25/19 0221 07/26/19 0220  WBC 11.2* 13.3* 12.8*  RBC 3.53* 3.53* 3.89*  HGB 11.3* 11.5* 12.1*  HCT 33.1* 33.1* 37.1*  MCV 93.8 93.8 95.4  MCH 32.0 32.6 31.1  MCHC 34.1 34.7 32.6  RDW 14.7 14.5 14.3  PLT 179 189 248    BNPNo results for input(s): BNP, PROBNP in the last 168 hours.   DDimer No results for input(s): DDIMER in the last 168 hours.   Radiology    Dg Chest Port 1 View  Result Date: 07/26/2019 CLINICAL DATA:  Recent CABG EXAM: PORTABLE CHEST 1 VIEW COMPARISON:  07/25/2019 FINDINGS: Bilateral interstitial thickening with patchy lower lobe alveolar airspace opacities. No pleural effusion or pneumothorax. Stable cardiomegaly.  Prior CABG. No acute osseous abnormality. IMPRESSION: Bilateral lower lobe airspace disease concerning for atelectasis versus pneumonia. Electronically Signed   By: Kathreen Devoid   On: 07/26/2019 07:25   Dg Chest Port 1 View  Result Date: 07/25/2019 CLINICAL DATA:  Pleural effusion follow up.  CABG x2 07/22/2019. EXAM: PORTABLE CHEST 1 VIEW COMPARISON:  07/24/2019 FINDINGS: Patient slightly rotated to the right. Lungs are adequately inflated with interval improvement in previously seen interstitial edema. No effusion. Cardiomediastinal silhouette and remainder of the exam is unchanged.  IMPRESSION: Moderate interval improvement with only minimal residual vascular congestion. Electronically Signed   By: Marin Olp M.D.   On: 07/25/2019 09:25    Cardiac Studies   07/19/2019 ECHO:  1. Left ventricular ejection fraction, by visual estimation, is 25%. The left ventricle has severely decreased function. There is no left ventricular hypertrophy. 2. Left ventricular diastolic Doppler parameters are indeterminate pattern of LV diastolic filling. 3. The anteroseptal wall and apex are akinetic. 4. Global right ventricle has normal systolic function.The right ventricular size is normal. No increase in right ventricular wall thickness. 5. Left atrial size was normal. 6. Right atrial size was normal. 7. The mitral valve is abnormal. No evidence of mitral valve regurgitation. No evidence of mitral stenosis. 8. The tricuspid valve is normal in structure. Tricuspid valve regurgitation was not visualized by color flow Doppler. 9. The aortic valve has an indeterminant number of cusps Aortic valve regurgitation was not visualized by color flow Doppler. Mild aortic valve sclerosis without stenosis. 10. The pulmonic valve was not well visualized. Pulmonic valve regurgitation is not visualized by color flow Doppler. 11. The inferior vena cava is normal in size with greater than 50% respiratory variability, suggesting right atrial pressure of 3 mmHg. 12. Acute decrease in LVEF and acute wall motion abnormalities as reported above compared to 07/17/19 echo  07/19/2019 LHC: 1. Acute systolic heart failure with precipitous drop in LVEF with anteroapical wall motion abnormality likely related to acute ischemia. Cannot totally exclude the possibility of a stress cardiomyopathy component. LVEDP is 20 mmHg. 2. Severe coronary artery disease with segmental 70 to 90% proximal to mid LAD stenosis. 3. 60% second obtuse marginal. The first marginal is small. 4. Total occlusion, flush, RCA with  large collateral bed from left circumflex to distal left ventricular branch.  Patient Profile     78 y.o. male with medically managed CAD, hypertension, hyperlipidemia, and PAD admitted with altered mental status concerning for pneumonia.  While in the hospital he developed anterior ST elevation myocardial infarction.  His LVEF reduced from 55 to 60% on admission to 25 to 30% with his STEMI.  Left heart catheter revealed disease in the proximal to mid LAD, OM 2, and total occlusion of the RCA.  He underwent CABG on 07/22/2019.  Assessment & Plan    # STEMI:  # Hyperlipidemia: Anterior STEMI during hospitalization.  CABG 07/22/19.  Continue aspirin, atorvastatin, carvedilol.    # Acute systolic and diastolic heart failure: He appears to be euvolemic.  Creatinine is rising.  We will stop Lasix for now, especially given that he is now on Entresto.  Increase carvedilol to 25 mg twice daily.  Continue Entresto, which was started 10/3.  At home he was on losartan 100 mg daily.  He will need a repeat echocardiogram in 3 months to assess for improvement in LVEF.  If blood pressure and renal function allow, would add spironolactone.  # AKI:  Renal function continues to rise.  Holding Lasix as above.  Continue Entresto for now.    # Hypertension:  BP running high.  Given his rising creatinine we will not increase Entresto at this time.  Increase carvedilol to 25mg  bid.  #Atelectasis: Encourage incentive spirometer use.  Time spent: 25 minutes-Greater than 50% of this time was spent in counseling, explanation of diagnosis, planning of further management, and coordination of care.   For questions or updates, please contact [place] Please consult [email or web address] for contact info under        Signed, Skeet Latch, MD  07/26/2019, 8:29 AM

## 2019-07-27 ENCOUNTER — Inpatient Hospital Stay (HOSPITAL_COMMUNITY): Payer: Medicare Other

## 2019-07-27 LAB — CBC
HCT: 33 % — ABNORMAL LOW (ref 39.0–52.0)
Hemoglobin: 10.9 g/dL — ABNORMAL LOW (ref 13.0–17.0)
MCH: 31.1 pg (ref 26.0–34.0)
MCHC: 33 g/dL (ref 30.0–36.0)
MCV: 94 fL (ref 80.0–100.0)
Platelets: 226 10*3/uL (ref 150–400)
RBC: 3.51 MIL/uL — ABNORMAL LOW (ref 4.22–5.81)
RDW: 14.1 % (ref 11.5–15.5)
WBC: 8.5 10*3/uL (ref 4.0–10.5)
nRBC: 0 % (ref 0.0–0.2)

## 2019-07-27 LAB — GLUCOSE, CAPILLARY
Glucose-Capillary: 100 mg/dL — ABNORMAL HIGH (ref 70–99)
Glucose-Capillary: 105 mg/dL — ABNORMAL HIGH (ref 70–99)
Glucose-Capillary: 119 mg/dL — ABNORMAL HIGH (ref 70–99)
Glucose-Capillary: 125 mg/dL — ABNORMAL HIGH (ref 70–99)
Glucose-Capillary: 127 mg/dL — ABNORMAL HIGH (ref 70–99)
Glucose-Capillary: 128 mg/dL — ABNORMAL HIGH (ref 70–99)
Glucose-Capillary: 139 mg/dL — ABNORMAL HIGH (ref 70–99)
Glucose-Capillary: 96 mg/dL (ref 70–99)

## 2019-07-27 LAB — BASIC METABOLIC PANEL
Anion gap: 8 (ref 5–15)
BUN: 17 mg/dL (ref 8–23)
CO2: 25 mmol/L (ref 22–32)
Calcium: 7.8 mg/dL — ABNORMAL LOW (ref 8.9–10.3)
Chloride: 107 mmol/L (ref 98–111)
Creatinine, Ser: 1.17 mg/dL (ref 0.61–1.24)
GFR calc Af Amer: >60 mL/min (ref >60)
GFR calc non Af Amer: 59 mL/min — ABNORMAL LOW (ref >60)
Glucose, Bld: 104 mg/dL — ABNORMAL HIGH (ref 70–99)
Potassium: 3.2 mmol/L — ABNORMAL LOW (ref 3.5–5.1)
Sodium: 140 mmol/L (ref 135–145)

## 2019-07-27 MED ORDER — SPIRONOLACTONE 12.5 MG HALF TABLET
12.5000 mg | ORAL_TABLET | Freq: Every day | ORAL | Status: DC
  Administered 2019-07-27 – 2019-07-29 (×3): 12.5 mg via ORAL
  Filled 2019-07-27 (×3): qty 1

## 2019-07-27 MED ORDER — DIPHENHYDRAMINE HCL 25 MG PO CAPS
25.0000 mg | ORAL_CAPSULE | Freq: Once | ORAL | Status: AC
  Administered 2019-07-27: 25 mg via ORAL
  Filled 2019-07-27: qty 1

## 2019-07-27 MED ORDER — POTASSIUM CHLORIDE CRYS ER 20 MEQ PO TBCR
20.0000 meq | EXTENDED_RELEASE_TABLET | ORAL | Status: AC
  Administered 2019-07-27 (×3): 20 meq via ORAL
  Filled 2019-07-27 (×3): qty 1

## 2019-07-27 MED ORDER — SODIUM CHLORIDE 0.9 % IV SOLN: 250.0000 mL | INTRAVENOUS | Status: DC | PRN

## 2019-07-27 MED ORDER — SODIUM CHLORIDE 0.9% FLUSH: 3.0000 mL | INTRAVENOUS | Status: DC | PRN

## 2019-07-27 MED ORDER — MOVING RIGHT ALONG BOOK
Freq: Once | Status: DC
  Filled 2019-07-27: qty 1

## 2019-07-27 NOTE — Evaluation (Signed)
Occupational Therapy Evaluation Patient Details Name: Philip Hurst MRN: [id number] DOB: 10/25/1940 Today's Date: 07/27/2019    History of Present Illness 78 year old male presenting with sepsis, now with concern for PE vs ACS. Patient was taken to an urgent LHC 9/27 due to EKG changes overnight and rising troponins. Found to have total occlusion of RCA with collaterals from left circ. LAD 50-90%. CABG x 2 on 07/22/19   Clinical Impression   PTA patient independent and driving. Admitted for above and limited by problem list below, including decreased activity tolerance, generalized weakness, and impaired balance. Educated on sternal precautions, ADL compensatory techniques and safety.  Patient currently requires min assist for UB ADLs, mod- max assist for LB ADls, min guard for transfers.  He will benefit from continued OT services while admitted and after dc at [facility] level in order to maximize independence and safety with ADLs, mobility.     Follow Up Recommendations  Home health OT;Supervision/Assistance - 24 hour    Equipment Recommendations  None recommended by OT    Recommendations for Other Services       Precautions / Restrictions Precautions Precautions: Fall;Sternal Precaution Booklet Issued: Yes (comment) Restrictions Weight Bearing Restrictions: Yes Other Position/Activity Restrictions: sternal precautions      Mobility Bed Mobility Overal bed mobility: Needs Assistance Bed Mobility: Rolling;Sidelying to Sit Rolling: Min guard Sidelying to sit: Min guard;HOB elevated       General bed mobility comments: OOB in recliner   Transfers Overall transfer level: Needs assistance Equipment used: Rolling walker (2 wheeled) Transfers: Sit to/from Stand Sit to Stand: Min guard         General transfer comment: min guard for safety and balance, good recall of hand placement     Balance Overall balance assessment: Needs assistance Sitting-balance support: No upper  extremity supported;Feet supported Sitting balance-Leahy Scale: Good     Standing balance support: During functional activity Standing balance-Leahy Scale: Poor Standing balance comment: relaint on B UE support                           ADL either performed or assessed with clinical judgement   ADL Overall ADL's : Needs assistance/impaired     Grooming: Set up;Sitting   Upper Body Bathing: Set up;Sitting   Lower Body Bathing: Moderate assistance;Sit to/from stand Lower Body Bathing Details (indicate cue type and reason): decreased reach to B LEs and buttocks Upper Body Dressing : Minimal assistance;Sitting   Lower Body Dressing: Moderate assistance;Sit to/from stand Lower Body Dressing Details (indicate cue type and reason): assist required to manage socks Toilet Transfer: Min guard;Ambulation;RW Toilet Transfer Details (indicate cue type and reason): simulated in room         Functional mobility during ADLs: Min guard;Rolling walker;Cueing for safety General ADL Comments: pt limited by generalized weakness, decreased activity tolerance and endurance     Vision Baseline Vision/History: Wears glasses Wears Glasses: At all times Patient Visual Report: No change from baseline Vision Assessment?: No apparent visual deficits     Perception     Praxis      Pertinent Vitals/Pain Pain Assessment: No/denies pain     Hand Dominance Right   Extremity/Trunk Assessment Upper Extremity Assessment Upper Extremity Assessment: Generalized weakness   Lower Extremity Assessment Lower Extremity Assessment: Defer to PT evaluation       Communication Communication Communication: No difficulties   Cognition Arousal/Alertness: Awake/alert Behavior During Therapy: WFL for tasks assessed/performed Overall  Cognitive Status: Within Functional Limits for tasks assessed                                     General Comments  daughter present and  supportive    Exercises     Shoulder Instructions      Home Living Family/patient expects to be discharged to:: Private residence Living Arrangements: Spouse/significant other Available Help at Discharge: Family Type of Home: House Home Access: Level entry Entrance Stairs-Number of Steps: basically a level entry   [place]: One level     Bathroom Shower/Tub: Occupational psychologist: [place]: [place] in;Grab bars - tub/shower;Bedside commode;Walker - 2 wheels;Cane - single point          Prior Functioning/Environment Level of Independence: Independent        Comments: independent ADLs, iADLs, driving         OT Problem List: Decreased strength;Decreased activity tolerance;Impaired balance (sitting and/or standing);Decreased knowledge of use of DME or AE;Decreased knowledge of precautions;Cardiopulmonary status limiting activity      OT Treatment/Interventions: Self-care/ADL training;DME and/or AE instruction;Therapeutic activities;Patient/family education;Balance training    OT Goals(Current goals can be found in the care plan section) Acute Rehab OT Goals Patient Stated Goal: to return to independent OT Goal Formulation: With patient Time For Goal Achievement: 08/10/19 Potential to Achieve Goals: Good  OT Frequency: Min 2X/week   Barriers to D/C:            Co-evaluation              AM-PAC OT "6 Clicks" Daily Activity     Outcome Measure Help from another person eating meals?: None Help from another person taking care of personal grooming?: None Help from another person toileting, which includes using toliet, bedpan, or urinal?: A Lot Help from another person bathing (including washing, rinsing, drying)?: A Lot Help from another person to put on and taking off regular upper body clothing?: A Little Help from another person to put on and taking off regular lower body clothing?: A Lot 6 Click Score: 17    End of Session Equipment Utilized During Treatment: Rolling walker Nurse Communication: Mobility status  Activity Tolerance: Patient tolerated treatment well Patient left: in chair;with call bell/phone within reach;with chair alarm set;with family/visitor present  OT Visit Diagnosis: Muscle weakness (generalized) (M62.81);Unsteadiness on feet (R26.81)                Time: [id number] OT Time Calculation (min): 18 min Charges:  OT General Charges $OT Visit: 1 Visit OT Evaluation $OT Eval Moderate Complexity: Claude, OT Acute Rehabilitation Services Pager [phone] Office [phone]   Delight Stare 07/27/2019, 1:28 PM

## 2019-07-27 NOTE — Progress Notes (Signed)
Progress Note  Patient Name: Philip Hurst Date of Encounter: 07/27/2019  Primary Cardiologist: Quay Burow, MD   Subjective   Philip Hurst is doing well this morning.  He is ambulating without any difficulty.  He denies shortness of breath or subsequent chest pain.  Inpatient Medications    Scheduled Meds: . acetaminophen  1,000 mg Oral Q6H   Or  . acetaminophen (TYLENOL) oral liquid 160 mg/5 mL  1,000 mg Per Tube Q6H  . aspirin EC  325 mg Oral Daily   Or  . aspirin  324 mg Per Tube Daily  . atorvastatin  80 mg Oral q1800  . bisacodyl  10 mg Oral Daily   Or  . bisacodyl  10 mg Rectal Daily  . carvedilol  25 mg Oral BID WC  . Chlorhexidine Gluconate Cloth  6 each Topical Daily  . docusate sodium  200 mg Oral Daily  . enoxaparin (LOVENOX) injection  30 mg Subcutaneous QHS  . gabapentin  100 mg Oral QHS  . insulin aspart  0-24 Units Subcutaneous Q4H  . loratadine  10 mg Oral Daily  . mouth rinse  15 mL Mouth Rinse BID  . ondansetron (ZOFRAN) IV  4 mg Intravenous Once  . pantoprazole  40 mg Oral Daily  . potassium chloride  20 mEq Oral Q4H  . sacubitril-valsartan  1 tablet Oral BID  . sodium chloride flush  10-40 mL Intracatheter Q12H  . sodium chloride flush  3 mL Intravenous Q12H   Continuous Infusions: . sodium chloride    . sodium chloride    . sodium chloride    . lactated ringers    . lactated ringers    . lactated ringers 10 mL/hr at 07/24/19 1800   PRN Meds: sodium chloride, Gerhardt's butt cream, lactated ringers, metoprolol tartrate, morphine injection, ondansetron (ZOFRAN) IV, oxyCODONE, sodium chloride flush, sodium chloride flush, traMADol   Vital Signs    Vitals:   07/27/19 0347 07/27/19 0400 07/27/19 0500 07/27/19 0600  BP:  (!) 144/58 (!) 144/67 (!) 148/69  Pulse:  65 62 66  Resp:  15 18 12   Temp: (!) 97.3 F (36.3 C)     TempSrc: Oral     SpO2:  98% 99% 98%  Weight:   77.9 kg   Height:        Intake/Output Summary (Last 24 hours) at  07/27/2019 0659 Last data filed at 07/27/2019 0400 Gross per 24 hour  Intake 300 ml  Output 400 ml  Net -100 ml   Filed Weights   07/25/19 0500 07/26/19 0650 07/27/19 0500  Weight: 80.9 kg 79 kg 77.9 kg    Telemetry    No evidence of V. tach or V. fib- Personally Reviewed  ECG    Last EKG on July 24, 2019 showed normal sinus rhythm with minimal T wave inversion in V4 to V6- Personally Reviewed  Physical Exam   GEN: No acute distress.   Cardiac: RRR, no murmurs, rubs, or gallops.  Respiratory: Clear to auscultation bilaterally. MS: No edema; No deformity. Psych: Normal affect   Labs    Chemistry Recent Labs  Lab 07/22/19 0452  07/25/19 0221 07/26/19 0220 07/27/19 0236  NA 141   < > 138 139 140  K 3.6   < > 3.5 3.6 3.2*  CL 107   < > 103 100 107  CO2 23   < > 25 23 25   GLUCOSE 110*   < > 107* 95 104*  BUN  34*   < > 24* 22 17  CREATININE 1.29*   < > 1.23 1.38* 1.17  CALCIUM 8.0*   < > 7.9* 8.1* 7.8*  PROT 5.1*  --   --   --   --   ALBUMIN 2.1*  --   --   --   --   AST 98*  --   --   --   --   ALT 299*  --   --   --   --   ALKPHOS 69  --   --   --   --   BILITOT 0.3  --   --   --   --   GFRNONAA 53*   < > 56* 49* 59*  GFRAA >60   < > >60 56* >60  ANIONGAP 11   < > 10 16* 8   < > = values in this interval not displayed.     Hematology Recent Labs  Lab 07/25/19 0221 07/26/19 0220 07/27/19 0236  WBC 13.3* 12.8* 8.5  RBC 3.53* 3.89* 3.51*  HGB 11.5* 12.1* 10.9*  HCT 33.1* 37.1* 33.0*  MCV 93.8 95.4 94.0  MCH 32.6 31.1 31.1  MCHC 34.7 32.6 33.0  RDW 14.5 14.3 14.1  PLT 189 248 226    Cardiac EnzymesNo results for input(s): TROPONINI in the last 168 hours. No results for input(s): TROPIPOC in the last 168 hours.   BNPNo results for input(s): BNP, PROBNP in the last 168 hours.   DDimer No results for input(s): DDIMER in the last 168 hours.   Radiology    Dg Chest Port 1 View  Result Date: 07/26/2019 CLINICAL DATA:  Recent CABG EXAM: PORTABLE  CHEST 1 VIEW COMPARISON:  07/25/2019 FINDINGS: Bilateral interstitial thickening with patchy lower lobe alveolar airspace opacities. No pleural effusion or pneumothorax. Stable cardiomegaly. Prior CABG. No acute osseous abnormality. IMPRESSION: Bilateral lower lobe airspace disease concerning for atelectasis versus pneumonia. Electronically Signed   By: Kathreen Devoid   On: 07/26/2019 07:25   Dg Chest Port 1 View  Result Date: 07/25/2019 CLINICAL DATA:  Pleural effusion follow up.  CABG x2 07/22/2019. EXAM: PORTABLE CHEST 1 VIEW COMPARISON:  07/24/2019 FINDINGS: Patient slightly rotated to the right. Lungs are adequately inflated with interval improvement in previously seen interstitial edema. No effusion. Cardiomediastinal silhouette and remainder of the exam is unchanged. IMPRESSION: Moderate interval improvement with only minimal residual vascular congestion. Electronically Signed   By: Marin Olp M.D.   On: 07/25/2019 09:25    Cardiac Studies   07/19/2019 ECHO:  1. Left ventricular ejection fraction, by visual estimation, is 25%. The left ventricle has severely decreased function. There is no left ventricular hypertrophy. 2. Left ventricular diastolic Doppler parameters are indeterminate pattern of LV diastolic filling. 3. The anteroseptal wall and apex are akinetic. 4. Global right ventricle has normal systolic function.The right ventricular size is normal. No increase in right ventricular wall thickness. 5. Left atrial size was normal. 6. Right atrial size was normal. 7. The mitral valve is abnormal. No evidence of mitral valve regurgitation. No evidence of mitral stenosis. 8. The tricuspid valve is normal in structure. Tricuspid valve regurgitation was not visualized by color flow Doppler. 9. The aortic valve has an indeterminant number of cusps Aortic valve regurgitation was not visualized by color flow Doppler. Mild aortic valve sclerosis without stenosis. 10. The pulmonic valve  was not well visualized. Pulmonic valve regurgitation is not visualized by color flow Doppler. 11. The inferior vena  cava is normal in size with greater than 50% respiratory variability, suggesting right atrial pressure of 3 mmHg. 12. Acute decrease in LVEF and acute wall motion abnormalities as reported above compared to 07/17/19 echo  07/19/2019 LHC: 1. Acute systolic heart failure with precipitous drop in LVEF with anteroapical wall motion abnormality likely related to acute ischemia. Cannot totally exclude the possibility of a stress cardiomyopathy component. LVEDP is 20 mmHg. 2. Severe coronary artery disease with segmental 70 to 90% proximal to mid LAD stenosis. 3. 60% second obtuse marginal. The first marginal is small. 4. Total occlusion, flush, RCA with large collateral bed from left circumflex to distal left ventricular branch.   Patient Profile     78 y.o. male with medically managed CAD, hypertension, hyperlipidemia, and PAD admitted with altered mental status concerning for pneumonia.  While in the hospital he developed anterior ST elevation myocardial infarction.  His LVEF reduced from 55 to 60% on admission to 25 to 30% with his STEMI.  Left heart catheter revealed disease in the proximal to mid LAD, OM 2, and total occlusion of the RCA.  He underwent CABG on 07/22/2019.  Assessment & Plan    #Acute anterior wall MI/NSTEMI He was found to have an anterior wall STEMI and is status post CABG on 07/22/2019 -Continue aspirin, Lipitor and Coreg  #Hyperlipidemia -Continue Lipitor  #Acute combined systolic and diastolic heart failure Continues to remain euvolemic.  Lasix was held yesterday due to rising creatinine. -Add spironolactone 12.5 ,g daily  -Continue Coreg 25 mg BID -Continue Entresto (start date 08/21/2019) -Repeat echocardiogram in 3 months to reassess LVEF  #Acute kidney injury- improved: Serum creatinine this a.m. at 1.1 with U OP of 400 cc.  During this  hospitalization he is net +1 point 2L -Continue to monitor BMP  #Hypertension: -Continue Coreg 25 mg BID - Continue Entresto     For questions or updates, please contact [place] Please consult [email or web address] for contact info under Cardiology/STEMI.      Signed, Jean Rosenthal, MD  07/27/2019, 6:59 AM

## 2019-07-27 NOTE — Progress Notes (Signed)
      [address]       [place] [address]             [phone]                 5 Days Post-Op Procedure(s): OFF PUMP CORONARY ARTERY BYPASS GRAFTING (CABG) times two using left internal mammary artery and right leg saphenous vein   Events: Doing well.  Ambulated with assistance _______________________________________________________________ Vitals: BP 120/62   Pulse 77   Temp 98.1 F (36.7 C) (Oral)   Resp 17   Ht 5\' 3"  (1.6 m)   Wt 77.9 kg   SpO2 96%   BMI 30.42 kg/m   - Neuro: alert NAD  - Cardiovascular: sinus  Drips: none    - Pulm: EWOB, off [place]    ABG    Component Value Date/Time   PHART 7.418 07/23/2019 0143   PCO2ART 34.5 07/23/2019 0143   PO2ART 68.0 (L) 07/23/2019 0143   HCO3 22.2 07/23/2019 0143   TCO2 23 07/23/2019 0143   ACIDBASEDEF 2.0 07/23/2019 0143   O2SAT 93.0 07/23/2019 0143    - Abd: soft NT - Extremity: trace edema  .Intake/Output      10/04 0701 - 10/05 0700 10/05 0701 - 10/06 0700   P.O. 300    Total Intake(mL/kg) 300 (3.9)    Urine (mL/kg/hr) 400 (0.2)    Stool 0    Total Output 400    Net -100         Urine Occurrence 5 x 1 x   Stool Occurrence 10 x 1 x      _______________________________________________________________ Labs: CBC Latest Ref Rng & Units 07/27/2019 07/26/2019 07/25/2019  WBC 4.0 - 10.5 K/uL 8.5 12.8(H) 13.3(H)  Hemoglobin 13.0 - 17.0 g/dL 10.9(L) 12.1(L) 11.5(L)  Hematocrit 39.0 - 52.0 % 33.0(L) 37.1(L) 33.1(L)  Platelets 150 - 400 K/uL 226 248 189   CMP Latest Ref Rng & Units 07/27/2019 07/26/2019 07/25/2019  Glucose 70 - 99 mg/dL 104(H) 95 107(H)  BUN 8 - 23 mg/dL 17 22 24(H)  Creatinine 0.61 - 1.24 mg/dL 1.17 1.38(H) 1.23  Sodium 135 - 145 mmol/L 140 139 138  Potassium 3.5 - 5.1 mmol/L 3.2(L) 3.6 3.5  Chloride 98 - 111 mmol/L 107 100 103  CO2 22 - 32 mmol/L 25 23 25   Calcium 8.9 - 10.3 mg/dL 7.8(L) 8.1(L) 7.9(L)  Total Protein 6.5 - 8.1 g/dL - - -  Total Bilirubin 0.3 - 1.2  mg/dL - - -  Alkaline Phos 38 - 126 U/L - - -  AST 15 - 41 U/L - - -  ALT 0 - 44 U/L - - -    CXR: stable  _______________________________________________________________  Assessment and Plan: POD 5 s/p CABG.  Doing well  Neuro: pain controlled CV: on A/S/BB.  Some HTN, will continue to follow as BB increased Pulm: continue pulm toilet Renal: creat downtrending.  Off lasix GI: on diet Heme: stable ID: [id number] WBC, afebrile Endo: on SS Dispo: floor  Melodie Bouillon, MD 07/27/2019 8:28 AM

## 2019-07-27 NOTE — Progress Notes (Signed)
Physical Therapy Treatment Patient Details Name: Philip Hurst MRN: [id number] DOB: 04/05/1941 Today's Date: 07/27/2019    History of Present Illness 78 year old male presenting with sepsis, now with concern for PE vs ACS. Patient was taken to an urgent LHC 9/27 due to EKG changes overnight and rising troponins. Found to have total occlusion of RCA with collaterals from left circ. LAD 50-90%. CABG x 2 on 07/22/19    PT Comments    Patient progressing well towards PT goals. Tolerated transfers and gait training with Min guard assist for balance/safety and use of RW. Improved ambulation distance but still fatigues after 150' requiring seated rest break. 2/4 DOE noted with mobility and Sp02 ranged from 86-84% on RA (likely did not drop for long, but the pleth was not a good reading during gait). Continued education on sternal precautions. VSS throughout. BP pre activity 130/65 BP post activity 126/68 Encouraged increasing walking with nursing daily. Will continue to follow.   Follow Up Recommendations  Home health PT;Supervision/Assistance - 24 hour     Equipment Recommendations  None recommended by PT    Recommendations for Other Services       Precautions / Restrictions Precautions Precautions: Fall;Sternal Precaution Booklet Issued: Yes (comment) Restrictions Weight Bearing Restrictions: Yes Other Position/Activity Restrictions: sternal precautions    Mobility  Bed Mobility Overal bed mobility: Needs Assistance Bed Mobility: Rolling;Sidelying to Sit Rolling: Min guard Sidelying to sit: Min guard;HOB elevated       General bed mobility comments: Able to get to EOB wihtout assist, increased time.  Transfers Overall transfer level: Needs assistance Equipment used: Rolling walker (2 wheeled) Transfers: Sit to/from Stand Sit to Stand: Min guard         General transfer comment: Min guard for safety. Cues for hands on knees and able to power up, Transferred to chair post  ambulation.  Ambulation/Gait Ambulation/Gait assistance: Min guard Gait Distance (Feet): 150 Feet Assistive device: Rolling walker (2 wheeled) Gait Pattern/deviations: Step-through pattern;Decreased stride length Gait velocity: decreased   General Gait Details: Slow, mostly steady gait with use of RW for support; Sp02 not getting accurate reading during mobility reading ~86% on RA but this recovered quickly to 94% once sitting. Fatigues.2/4 DOE.   Stairs             Wheelchair Mobility    Modified Rankin (Stroke Patients Only)       Balance Overall balance assessment: Needs assistance Sitting-balance support: No upper extremity supported;Feet supported Sitting balance-Leahy Scale: Good     Standing balance support: During functional activity Standing balance-Leahy Scale: Poor Standing balance comment: Requires Ue support in standing.                            Cognition Arousal/Alertness: Awake/alert Behavior During Therapy: WFL for tasks assessed/performed Overall Cognitive Status: Within Functional Limits for tasks assessed                                        Exercises      General Comments General comments (skin integrity, edema, etc.): Continued education on sternal precautions      Pertinent Vitals/Pain Pain Assessment: No/denies pain    Home Living                      Prior Function  PT Goals (current goals can now be found in the care plan section) Progress towards PT goals: Progressing toward goals    Frequency    Min 3X/week      PT Plan Current plan remains appropriate    Co-evaluation              AM-PAC PT "6 Clicks" Mobility   Outcome Measure  Help needed turning from your back to your side while in a flat bed without using bedrails?: A Little Help needed moving from lying on your back to sitting on the side of a flat bed without using bedrails?: A Little Help needed  moving to and from a bed to a chair (including a wheelchair)?: A Little Help needed standing up from a chair using your arms (e.g., wheelchair or bedside chair)?: A Little Help needed to walk in hospital room?: A Little Help needed climbing 3-5 steps with a railing? : A Little 6 Click Score: 18    End of Session Equipment Utilized During Treatment: Gait belt Activity Tolerance: Patient limited by fatigue;Patient tolerated treatment well Patient left: in chair;with call bell/phone within reach;with chair alarm set Nurse Communication: Mobility status PT Visit Diagnosis: Other abnormalities of gait and mobility (R26.89);Muscle weakness (generalized) (M62.81)     Time: [id number] PT Time Calculation (min) (ACUTE ONLY): 23 min  Charges:  $Gait Training: 8-22 mins $Therapeutic Activity: 8-22 mins                     Wray Kearns, [place], DPT Acute Rehabilitation Services Pager [phone] Office [place] 07/27/2019, 11:49 AM

## 2019-07-27 NOTE — Progress Notes (Addendum)
Attempted report  [id number] Report called to Randol Kern, RN on 4E. Pt transported via bed to 4E26

## 2019-07-27 NOTE — Progress Notes (Signed)
[place] full care of patient. Patient sitting up in chair eating lunch, NAD, VSS, WCTM.

## 2019-07-27 NOTE — Progress Notes (Signed)
Assisted tele visit to patient with wife.  Sabirin Baray R, RN  

## 2019-07-28 LAB — CBC
HCT: 33.1 % — ABNORMAL LOW (ref 39.0–52.0)
Hemoglobin: 10.7 g/dL — ABNORMAL LOW (ref 13.0–17.0)
MCH: 30.5 pg (ref 26.0–34.0)
MCHC: 32.3 g/dL (ref 30.0–36.0)
MCV: 94.3 fL (ref 80.0–100.0)
Platelets: 258 10*3/uL (ref 150–400)
RBC: 3.51 MIL/uL — ABNORMAL LOW (ref 4.22–5.81)
RDW: 13.9 % (ref 11.5–15.5)
WBC: 6.7 10*3/uL (ref 4.0–10.5)
nRBC: 0 % (ref 0.0–0.2)

## 2019-07-28 LAB — BASIC METABOLIC PANEL
Anion gap: 12 (ref 5–15)
BUN: 17 mg/dL (ref 8–23)
CO2: 25 mmol/L (ref 22–32)
Calcium: 8.2 mg/dL — ABNORMAL LOW (ref 8.9–10.3)
Chloride: 106 mmol/L (ref 98–111)
Creatinine, Ser: 1.1 mg/dL (ref 0.61–1.24)
GFR calc Af Amer: >60 mL/min (ref >60)
GFR calc non Af Amer: >60 mL/min (ref >60)
Glucose, Bld: 94 mg/dL (ref 70–99)
Potassium: 3.6 mmol/L (ref 3.5–5.1)
Sodium: 143 mmol/L (ref 135–145)

## 2019-07-28 LAB — GLUCOSE, CAPILLARY
Glucose-Capillary: 99 mg/dL (ref 70–99)
Glucose-Capillary: 108 mg/dL — ABNORMAL HIGH (ref 70–99)
Glucose-Capillary: 153 mg/dL — ABNORMAL HIGH (ref 70–99)
Glucose-Capillary: 134 mg/dL — ABNORMAL HIGH (ref 70–99)
Glucose-Capillary: 145 mg/dL — ABNORMAL HIGH (ref 70–99)

## 2019-07-28 MED ORDER — SACUBITRIL-VALSARTAN 49-51 MG PO TABS
1.0000 | ORAL_TABLET | Freq: Two times a day (BID) | ORAL | Status: DC
  Administered 2019-07-28 – 2019-07-29 (×2): 1 via ORAL
  Filled 2019-07-28 (×3): qty 1

## 2019-07-28 MED ORDER — DIPHENHYDRAMINE HCL 25 MG PO CAPS
25.0000 mg | ORAL_CAPSULE | Freq: Every evening | ORAL | Status: DC | PRN
  Administered 2019-07-28: 25 mg via ORAL
  Filled 2019-07-28: qty 1

## 2019-07-28 NOTE — TOC Benefit Eligibility Note (Signed)
Transition of Care [facility]) Benefit Eligibility Note    Patient Details  Name: Philip Hurst MRN: [id number] Date of Birth: [birth year]   Medication/Dose: Delene Loll 14  Covered?: Yes  Tier: 3 Drug  Prescription Coverage Preferred Pharmacy: most major retail pharmacies  Spoke with Person/Company/Phone Number:: [employer]  Co-Pay: $47 for 30 day retail  Prior Approval: No    Delorse Lek Phone Number: 07/28/2019, 12:15 PM

## 2019-07-28 NOTE — Progress Notes (Signed)
CARDIAC REHAB PHASE I   PRE:  Rate/Rhythm: 74 NSR  BP:  Supine:   Sitting: 156/65  Standing:    SaO2: 94 RA  MODE:  Ambulation: 230 ft   POST:  Rate/Rhythem: 77 NSR  BP:  Supine:   Sitting: 148/69  Standing:    SaO2: 96 RA  1315-1340 Patient ambulated in hallway x 1 assist with RW and gait belt. Steady gait noted. 2 standing rest breaks taken for being "tired". Patient denied complaints of shortness of breath or pain. Patient states he is fearful of going home secondary to his weakness. Continues to work with PT. Of note, patient does have a wife and 3 daughters that he says are prepared to provide care post discharge. Post ambulation patient back to bed with call bell and phone in place.  Willadean Carol, RN, BSN

## 2019-07-28 NOTE — Progress Notes (Signed)
Progress Note  Patient Name: Philip Hurst Date of Encounter: 07/28/2019  Primary Cardiologist: Quay Burow, MD   Subjective   Some chest wall soreness. Walked in the hallway today.   Inpatient Medications    Scheduled Meds: . aspirin EC  325 mg Oral Daily   Or  . aspirin  324 mg Per Tube Daily  . atorvastatin  80 mg Oral q1800  . bisacodyl  10 mg Oral Daily   Or  . bisacodyl  10 mg Rectal Daily  . carvedilol  25 mg Oral BID WC  . docusate sodium  200 mg Oral Daily  . enoxaparin (LOVENOX) injection  30 mg Subcutaneous QHS  . gabapentin  100 mg Oral QHS  . insulin aspart  0-24 Units Subcutaneous Q4H  . loratadine  10 mg Oral Daily  . mouth rinse  15 mL Mouth Rinse BID  . moving right along book   Does not apply Once  . ondansetron (ZOFRAN) IV  4 mg Intravenous Once  . pantoprazole  40 mg Oral Daily  . sacubitril-valsartan  1 tablet Oral BID  . spironolactone  12.5 mg Oral Daily   Continuous Infusions: . sodium chloride     PRN Meds: sodium chloride, Gerhardt's butt cream, metoprolol tartrate, morphine injection, ondansetron (ZOFRAN) IV, oxyCODONE, sodium chloride flush, traMADol   Vital Signs    Vitals:   07/27/19 2300 07/28/19 0423 07/28/19 0430 07/28/19 0755  BP: (!) 143/74 (!) 147/77  (!) 168/79  Pulse: 65 72  83  Resp: 15 20  18   Temp: 98 F (36.7 C) 98.4 F (36.9 C)  97.6 F (36.4 C)  TempSrc: Oral Oral  Oral  SpO2: 98% 99%  95%  Weight:   79.2 kg   Height:        Intake/Output Summary (Last 24 hours) at 07/28/2019 0955 Last data filed at 07/28/2019 0536 Gross per 24 hour  Intake 402 ml  Output 450 ml  Net -48 ml   Last 3 Weights 07/28/2019 07/27/2019 07/26/2019  Weight (lbs) 174 lb 9.7 oz 171 lb 11.8 oz 174 lb 2.6 oz  Weight (kg) 79.2 kg 77.9 kg 79 kg      Telemetry    SR - Personally Reviewed  ECG    N/a - Personally Reviewed  Physical Exam  Pleasant older WM, sitting up in bed.  GEN: No acute distress.   Neck: No JVD Cardiac: RRR,  no murmurs, rubs, or gallops.  Respiratory: Clear to auscultation bilaterally. GI: Soft, nontender, non-distended  MS: No edema; No deformity. Neuro:  Nonfocal  Psych: Normal affect   Labs    High Sensitivity Troponin:   Recent Labs  Lab 07/17/19 0631 07/18/19 1243 07/18/19 2325 07/19/19 0900 07/20/19 0845  TROPONINIHS 1,370* 3,396* 3,206* 2,175* 941*      Chemistry Recent Labs  Lab 07/22/19 0452  07/26/19 0220 07/27/19 0236 07/28/19 0240  NA 141   < > 139 140 143  K 3.6   < > 3.6 3.2* 3.6  CL 107   < > 100 107 106  CO2 23   < > 23 25 25   GLUCOSE 110*   < > 95 104* 94  BUN 34*   < > 22 17 17   CREATININE 1.29*   < > 1.38* 1.17 1.10  CALCIUM 8.0*   < > 8.1* 7.8* 8.2*  PROT 5.1*  --   --   --   --   ALBUMIN 2.1*  --   --   --   --  AST 98*  --   --   --   --   ALT 299*  --   --   --   --   ALKPHOS 69  --   --   --   --   BILITOT 0.3  --   --   --   --   GFRNONAA 53*   < > 49* 59* >60  GFRAA >60   < > 56* >60 >60  ANIONGAP 11   < > 16* 8 12   < > = values in this interval not displayed.     Hematology Recent Labs  Lab 07/26/19 0220 07/27/19 0236 07/28/19 0240  WBC 12.8* 8.5 6.7  RBC 3.89* 3.51* 3.51*  HGB 12.1* 10.9* 10.7*  HCT 37.1* 33.0* 33.1*  MCV 95.4 94.0 94.3  MCH 31.1 31.1 30.5  MCHC 32.6 33.0 32.3  RDW 14.3 14.1 13.9  PLT 248 226 258    BNPNo results for input(s): BNP, PROBNP in the last 168 hours.   DDimer No results for input(s): DDIMER in the last 168 hours.   Radiology    Dg Chest Port 1 View  Result Date: 07/27/2019 CLINICAL DATA:  Chest pain status post CABG EXAM: PORTABLE CHEST 1 VIEW COMPARISON:  07/26/2019 FINDINGS: The patient is status post CABG. The heart size remains enlarged. There are prominent interstitial lung markings bilaterally. There is no pneumothorax. There are small bilateral pleural effusions. There is no acute osseous abnormality. IMPRESSION: 1. Cardiomegaly with vascular congestion. 2. Persistent bibasilar  atelectasis. Electronically Signed   By: Constance Holster M.D.   On: 07/27/2019 07:40    Cardiac Studies   Cath: [id number]   Acute systolic heart failure with precipitous drop in LVEF with anteroapical wall motion abnormality likely related to acute ischemia.  Cannot totally exclude the possibility of a stress cardiomyopathy component.  LVEDP is 20 mmHg.  Severe coronary artery disease with segmental 70 to 90% proximal to mid LAD stenosis.  60% second obtuse marginal.  The first marginal is small.  Total occlusion, flush, RCA with large collateral bed from left circumflex to distal left ventricular branch.  RECOMMENDATIONS:   IV heparin  IV nitroglycerin  Gentle diuresis but being careful to monitor kidney function.  He has been exposed to contrast on top of acute kidney injury present on admission.  50 cc of contrast used.  Needs surgical consultation to consider bypass grafting.  Ultimately, high risk PCI with LAD atherectomy and stenting using hemodynamic support would be a consideration although with known PAD, support may not be possible.  Diagnostic Dominance: Right    Patient Profile     78 y.o. male with medically managed CAD, hypertension, hyperlipidemia, and PAD admitted with altered mental status concerning for pneumonia. While in the hospital he developed anterior ST elevation myocardial infarction. His LVEF reduced from 55 to 60% on admission to 25 to 30% with his STEMI. Left heart catheter revealed disease in the proximal to mid LAD, OM 2, and total occlusion of the RCA. He underwent CABG on 07/22/2019.  Assessment & Plan    1. Acute anterior wall MI/NSTEMI: He was found to have an anterior wall STEMI and s/p CABG on 07/22/2019 -Continue aspirin (full dose) no plavix 2/2 to allergy, Lipitor and Coreg  2. Hyperlipidemia: Continue Lipitor  3. Acute combined systolic and diastolic heart failure: Remain euvolemic. On spironolactone 12.5 mg daily, Coreg 25  mg BID and Entresto.  Plan to reassess EF in 3 months as  an outpatient.  4. Acute kidney injury: now resolved.  BMET at follow up appt.  5. Hypertension: Blood pressures still elevated at times. Continue Coreg 25 mg BID and Entresto. May need further titration as an outpatient.  CARDIOLOGY RECOMMENDATIONS:  Discharge is anticipated in the next 48 hours. Recommendations for medications and follow up:  Discharge Medications: Continue medications as they are currently listed in the [facility]. Exceptions to the above:  None  Follow Up: The patient's Primary Cardiologist is Quay Burow, MD  Follow up in the office in 2 week(s) arranged.   Signed,  Reino Bellis, NP  10:03 AM 07/28/2019  [facility]

## 2019-07-28 NOTE — Progress Notes (Signed)
      [address]       [place] [address]             [phone]      6 Days Post-Op Procedure(s): OFF PUMP CORONARY ARTERY BYPASS GRAFTING (CABG) times two using left internal mammary artery and right leg saphenous vein Subjective: States that he is not ready to go home. He has some leg weakness, continued incisional pain, and nausea. He is uncomfortable with going home today and would like to wait.   Objective: Vital signs in last 24 hours: Temp:  [97.6 F (36.4 C)-98.5 F (36.9 C)] 97.6 F (36.4 C) (10/06 0755) Pulse Rate:  [65-83] 83 (10/06 0755) Cardiac Rhythm: Heart block (10/06 0700) Resp:  [12-20] 18 (10/06 0755) BP: (90-168)/(61-79) 168/79 (10/06 0755) SpO2:  [95 %-100 %] 95 % (10/06 0755) Weight:  [79.2 kg] 79.2 kg (10/06 0430)     Intake/Output from previous day: 10/05 0701 - 10/06 0700 In: 522 [P.O.:522] Out: 450 [Urine:450] Intake/Output this shift: No intake/output data recorded.  General appearance: alert, cooperative and no distress Heart: regular rate and rhythm, S1, S2 normal, no murmur, click, rub or gallop Lungs: clear to auscultation bilaterally Abdomen: soft, non-tender; bowel sounds normal; no masses,  no organomegaly Extremities: 1-2+ pitting pedal edema Wound: clean and dry.   Lab Results: Recent Labs    07/27/19 0236 07/28/19 0240  WBC 8.5 6.7  HGB 10.9* 10.7*  HCT 33.0* 33.1*  PLT 226 258   BMET:  Recent Labs    07/27/19 0236 07/28/19 0240  NA 140 143  K 3.2* 3.6  CL 107 106  CO2 25 25  GLUCOSE 104* 94  BUN 17 17  CREATININE 1.17 1.10  CALCIUM 7.8* 8.2*    PT/INR: No results for input(s): LABPROT, INR in the last 72 hours. ABG    Component Value Date/Time   PHART 7.418 07/23/2019 0143   HCO3 22.2 07/23/2019 0143   TCO2 23 07/23/2019 0143   ACIDBASEDEF 2.0 07/23/2019 0143   O2SAT 93.0 07/23/2019 0143   CBG (last 3)  Recent Labs    07/27/19 2007 07/27/19 2306 07/28/19 0829  GLUCAP 128* 96  108*    Assessment/Plan: S/P Procedure(s): OFF PUMP CORONARY ARTERY BYPASS GRAFTING (CABG) times two using left internal mammary artery and right leg saphenous vein  1. CV-Entresto initiated for hypertension. First degree heart block with HR in the 80s. Continue ASA, statin.  2. Pulm- tolerating room air with good oxygen saturation. 3. Renal- creatine 1.10, electrolytes okay. Does still have 1-2+ pitting pedal edema but weight is below baseline. On Spirolactone.  4. H and H 10.7/33.1, expected acute blood loss anemia 5. Endo-blood glucose well controlled 6. Nausea-zofran PRN, maintain a BRAT diet. Continue stool softeners 7. Chest pain- sternal precautions reviewed. Continue gabapentin, oxycodone, and morphine PRN.   Plan: Working with PT/OT today for leg weakness. Zofran, diet modification, and stool softeners for nausea. Continue to work on pain control-sternal precautions reviewed.    LOS: 12 days    Philip Hurst 07/28/2019

## 2019-07-28 NOTE — Progress Notes (Signed)
Physical Therapy Treatment Patient Details Name: Philip Hurst MRN: [id number] DOB: [birth year] Today's Date: 07/28/2019    History of Present Illness 78 year old male presenting with sepsis, now with concern for PE vs ACS. Patient was taken to an urgent LHC 9/27 due to EKG changes overnight and rising troponins. Found to have total occlusion of RCA with collaterals from left circ. LAD 50-90%. CABG x 2 on 07/22/19    PT Comments    Patient progressing slowly towards PT goals. Reports feeling weaker and more tired today than yesterday. Tolerated gait training with Min guard assist for balance/safety. Sp02 ranged from 87-95% on RA with cues provided for pursed lip breathing. Continued education on sternal precautions. Pt plans to d/c home today with support of wife. HR and BP stable (BP slightly elevated with SBP 170s). Will continue to follow.   Follow Up Recommendations  Home health PT;Supervision/Assistance - 24 hour     Equipment Recommendations  None recommended by PT    Recommendations for Other Services       Precautions / Restrictions Precautions Precautions: Fall;Sternal Precaution Booklet Issued: Yes (comment) Restrictions Weight Bearing Restrictions: Yes Other Position/Activity Restrictions: sternal precautions    Mobility  Bed Mobility Overal bed mobility: Needs Assistance Bed Mobility: Rolling;Sidelying to Sit Rolling: Min guard Sidelying to sit: Min guard;HOB elevated       General bed mobility comments: Cues for technique, increased time.  Transfers Overall transfer level: Needs assistance Equipment used: Rolling walker (2 wheeled) Transfers: Sit to/from Stand Sit to Stand: Min guard         General transfer comment: min guard for safety and balance, good recall of hand placement; transferred to chair post ambulation.  Ambulation/Gait Ambulation/Gait assistance: Min guard Gait Distance (Feet): 150 Feet Assistive device: Rolling walker (2  wheeled) Gait Pattern/deviations: Step-through pattern;Decreased stride length Gait velocity: decreased   General Gait Details: Slow, mostly steady gait with use of RW for support; Sp02 reading 87-95% on RA. 2/4 DOE. Reports worsened weakness BLEs.   Stairs             Wheelchair Mobility    Modified Rankin (Stroke Patients Only)       Balance Overall balance assessment: Needs assistance Sitting-balance support: No upper extremity supported;Feet supported Sitting balance-Leahy Scale: Good     Standing balance support: During functional activity Standing balance-Leahy Scale: Poor Standing balance comment: relaint on B UE support                            Cognition Arousal/Alertness: Awake/alert Behavior During Therapy: WFL for tasks assessed/performed Overall Cognitive Status: Within Functional Limits for tasks assessed                                        Exercises      General Comments General comments (skin integrity, edema, etc.): BP pre activity 174/83, BP post activity 174/85 HR stable 90-111 bpm.      Pertinent Vitals/Pain Pain Assessment: No/denies pain    Home Living                      Prior Function            PT Goals (current goals can now be found in the care plan section) Progress towards PT goals: Progressing toward goals  Frequency    Min 3X/week      PT Plan Current plan remains appropriate    Co-evaluation              AM-PAC PT "6 Clicks" Mobility   Outcome Measure  Help needed turning from your back to your side while in a flat bed without using bedrails?: A Little Help needed moving from lying on your back to sitting on the side of a flat bed without using bedrails?: A Little Help needed moving to and from a bed to a chair (including a wheelchair)?: A Little Help needed standing up from a chair using your arms (e.g., wheelchair or bedside chair)?: A Little Help needed to  walk in hospital room?: A Little Help needed climbing 3-5 steps with a railing? : A Little 6 Click Score: 18    End of Session Equipment Utilized During Treatment: Gait belt Activity Tolerance: Patient tolerated treatment well Patient left: in chair;with call bell/phone within reach;with nursing/sitter in room Nurse Communication: Mobility status PT Visit Diagnosis: Other abnormalities of gait and mobility (R26.89);Muscle weakness (generalized) (M62.81)     Time: 0802-0821 PT Time Calculation (min) (ACUTE ONLY): 19 min  Charges:  $Gait Training: 8-22 mins                     Wray Kearns, PT, DPT Acute Rehabilitation Services Pager [phone] Office [phone]       [place] 07/28/2019, 9:30 AM

## 2019-07-28 NOTE — Care Management Important Message (Signed)
Important Message  Patient Details  Name: DEMARIAE PANAGOPOULOS MRN: [id number] Date of Birth: [birth year]   Medicare Important Message Given:  Yes     Shelda Altes 07/28/2019, 3:49 PM

## 2019-07-29 LAB — BASIC METABOLIC PANEL
Anion gap: 8 (ref 5–15)
BUN: 14 mg/dL (ref 8–23)
CO2: 26 mmol/L (ref 22–32)
Calcium: 7.8 mg/dL — ABNORMAL LOW (ref 8.9–10.3)
Chloride: 105 mmol/L (ref 98–111)
Creatinine, Ser: 1.26 mg/dL — ABNORMAL HIGH (ref 0.61–1.24)
GFR calc Af Amer: >60 mL/min (ref >60)
GFR calc non Af Amer: 54 mL/min — ABNORMAL LOW (ref >60)
Glucose, Bld: 113 mg/dL — ABNORMAL HIGH (ref 70–99)
Potassium: 3.7 mmol/L (ref 3.5–5.1)
Sodium: 139 mmol/L (ref 135–145)

## 2019-07-29 LAB — GLUCOSE, CAPILLARY
Glucose-Capillary: 100 mg/dL — ABNORMAL HIGH (ref 70–99)
Glucose-Capillary: 109 mg/dL — ABNORMAL HIGH (ref 70–99)
Glucose-Capillary: 111 mg/dL — ABNORMAL HIGH (ref 70–99)
Glucose-Capillary: 95 mg/dL (ref 70–99)

## 2019-07-29 MED ORDER — SACUBITRIL-VALSARTAN 49-51 MG PO TABS: 1.0000 | ORAL_TABLET | Freq: Two times a day (BID) | ORAL | 0 refills | Status: DC

## 2019-07-29 MED ORDER — GABAPENTIN 100 MG PO CAPS: 100.0000 mg | ORAL_CAPSULE | Freq: Every day | ORAL | 0 refills | Status: DC

## 2019-07-29 MED ORDER — ATORVASTATIN CALCIUM 80 MG PO TABS: 80.0000 mg | ORAL_TABLET | Freq: Every day | ORAL | 1 refills | Status: DC

## 2019-07-29 MED ORDER — SPIRONOLACTONE 25 MG PO TABS: 12.5000 mg | ORAL_TABLET | Freq: Every day | ORAL | 0 refills | Status: DC

## 2019-07-29 MED ORDER — ASPIRIN 325 MG PO TBEC: 325.0000 mg | DELAYED_RELEASE_TABLET | Freq: Every day | ORAL | 0 refills | Status: DC

## 2019-07-29 MED ORDER — OXYCODONE HCL 5 MG PO TABS: 5.0000 mg | ORAL_TABLET | Freq: Four times a day (QID) | ORAL | 0 refills | Status: DC | PRN

## 2019-07-29 MED ORDER — ONDANSETRON HCL 4 MG PO TABS: 4.0000 mg | ORAL_TABLET | Freq: Three times a day (TID) | ORAL | 0 refills | Status: DC | PRN

## 2019-07-29 MED ORDER — CARVEDILOL 25 MG PO TABS: 25.0000 mg | ORAL_TABLET | Freq: Two times a day (BID) | ORAL | 1 refills | Status: DC

## 2019-07-29 NOTE — TOC Transition Note (Signed)
Transition of Care [facility]) - CM/SW Discharge Note Marvetta Gibbons RN, BSN Transitions of Care Unit 4E- RN Case Manager [phone]   Patient Details  Name: Philip Hurst MRN: [id number] Date of Birth: [birth year]  Transition of Care [facility]) CM/SW Contact:  Dawayne Patricia, RN Phone Number: 07/29/2019, 11:08 AM   Clinical Narrative:    Pt stable for transition home today- orders placed for HHRN/PT/OT- per previous CM note referral has been sent to [facility] per choice- call made to [facility] with Alvis Lemmings to notify of transition home today for [facility]. Per benefits check on Entrestro pt copay cost $47.    Final next level of care: [place] Barriers to Discharge: No Barriers Identified   Patient Goals and CMS Choice Patient states their goals for this hospitalization and ongoing recovery are:: get better CMS Medicare.gov Compare Post Acute Care list provided to:: Patient Represenative (must comment)(wife) Choice offered to / list presented to : Spouse  Discharge Placement         Home with [facility]              Discharge Plan and Services In-house Referral: NA Discharge Planning Services: CM Consult Post Acute Care Choice: Home Health          DME Arranged: (NA) DME Agency: NA       HH Arranged: RN, PT, OT HH Agency: [place] Date [facility] Agency Contacted: 07/29/19 Time [place]: [phone] Representative spoke with at [place]: [place] ([place]) Interventions     Readmission Risk Interventions Readmission Risk Prevention Plan 07/29/2019 07/24/2019  Transportation Screening Complete Complete  PCP or Specialist Appt within 5-7 Days Complete -  PCP or Specialist Appt within 3-5 Days - (No Data)  Home Care Screening Complete -  Medication Review (RN CM) Complete -  HRI or [place] - Complete  Social Work Consult for [place] Planning/Counseling - Complete  Palliative Care Screening - Not Applicable   Medication Review Press photographer) - Complete  Some recent data might be hidden

## 2019-07-29 NOTE — Progress Notes (Signed)
Progress Note  Patient Name: Philip Hurst Date of Encounter: 07/29/2019  Primary Cardiologist: Quay Burow, MD   Subjective   Patient feels better today. Weakness is improving. Likely discharge today.   Inpatient Medications    Scheduled Meds: . aspirin EC  325 mg Oral Daily   Or  . aspirin  324 mg Per Tube Daily  . atorvastatin  80 mg Oral q1800  . bisacodyl  10 mg Oral Daily   Or  . bisacodyl  10 mg Rectal Daily  . carvedilol  25 mg Oral BID WC  . docusate sodium  200 mg Oral Daily  . enoxaparin (LOVENOX) injection  30 mg Subcutaneous QHS  . gabapentin  100 mg Oral QHS  . insulin aspart  0-24 Units Subcutaneous Q4H  . loratadine  10 mg Oral Daily  . mouth rinse  15 mL Mouth Rinse BID  . moving right along book   Does not apply Once  . ondansetron (ZOFRAN) IV  4 mg Intravenous Once  . pantoprazole  40 mg Oral Daily  . sacubitril-valsartan  1 tablet Oral BID  . spironolactone  12.5 mg Oral Daily   Continuous Infusions: . sodium chloride     PRN Meds: sodium chloride, diphenhydrAMINE, Gerhardt's butt cream, metoprolol tartrate, morphine injection, ondansetron (ZOFRAN) IV, oxyCODONE, sodium chloride flush, traMADol   Vital Signs    Vitals:   07/28/19 1943 07/29/19 0046 07/29/19 0439 07/29/19 0805  BP: 135/61 (!) 148/65 (!) 156/73 137/66  Pulse: 73 79 77   Resp: 18 20 15 13   Temp: 97.6 F (36.4 C) 97.6 F (36.4 C) 98 F (36.7 C) 97.6 F (36.4 C)  TempSrc: Oral Oral Oral Oral  SpO2: 93% 91% 90% 95%  Weight:   79.5 kg   Height:        Intake/Output Summary (Last 24 hours) at 07/29/2019 0824 Last data filed at 07/29/2019 0439 Gross per 24 hour  Intake -  Output 375 ml  Net -375 ml   Last 3 Weights 07/29/2019 07/28/2019 07/27/2019  Weight (lbs) 175 lb 4.3 oz 174 lb 9.7 oz 171 lb 11.8 oz  Weight (kg) 79.5 kg 79.2 kg 77.9 kg      Telemetry    NSR, HR 70s, occasional PVC - Personally Reviewed  ECG    No new - Personally Reviewed  Physical Exam    GEN: No acute distress.   Neck: No JVD Cardiac: RRR, no murmurs, rubs, or gallops.  Respiratory: Clear to auscultation bilaterally. GI: Soft, nontender, non-distended  MS: 1+ edema; No deformity. Neuro:  Nonfocal  Psych: Normal affect   Labs    High Sensitivity Troponin:   Recent Labs  Lab 07/17/19 0631 07/18/19 1243 07/18/19 2325 07/19/19 0900 07/20/19 0845  TROPONINIHS 1,370* 3,396* 3,206* 2,175* 941*      Chemistry Recent Labs  Lab 07/26/19 0220 07/27/19 0236 07/28/19 0240  NA 139 140 143  K 3.6 3.2* 3.6  CL 100 107 106  CO2 23 25 25   GLUCOSE 95 104* 94  BUN 22 17 17   CREATININE 1.38* 1.17 1.10  CALCIUM 8.1* 7.8* 8.2*  GFRNONAA 49* 59* >60  GFRAA 56* >60 >60  ANIONGAP 16* 8 12     Hematology Recent Labs  Lab 07/26/19 0220 07/27/19 0236 07/28/19 0240  WBC 12.8* 8.5 6.7  RBC 3.89* 3.51* 3.51*  HGB 12.1* 10.9* 10.7*  HCT 37.1* 33.0* 33.1*  MCV 95.4 94.0 94.3  MCH 31.1 31.1 30.5  MCHC 32.6 33.0 32.3  RDW 14.3 14.1 13.9  PLT 248 226 258    BNPNo results for input(s): BNP, PROBNP in the last 168 hours.   DDimer No results for input(s): DDIMER in the last 168 hours.   Radiology    No results found.  Cardiac Studies   Cath: [id number]   Acute systolic heart failure with precipitous drop in LVEF with anteroapical wall motion abnormality likely related to acute ischemia. Cannot totally exclude the possibility of a stress cardiomyopathy component. LVEDP is 20 mmHg.  Severe coronary artery disease with segmental 70 to 90% proximal to mid LAD stenosis.  60% second obtuse marginal. The first marginal is small.  Total occlusion, flush, RCA with large collateral bed from left circumflex to distal left ventricular branch.  RECOMMENDATIONS:   IV heparin  IV nitroglycerin  Gentle diuresis but being careful to monitor kidney function. He has been exposed to contrast on top of acute kidney injury present on admission. 50 cc of contrast  used.  Needs surgical consultation to consider bypass grafting. Ultimately, high risk PCI with LAD atherectomy and stenting using hemodynamic support would be a consideration although with known PAD, support may not be possible.  Diagnostic Dominance: Right    Patient Profile     78 y.o. male with medically managed CAD, hypertension, hyperlipidemia, and PAD admitted with altered mental status concerning for pneumonia. While in the hospital he developed anterior ST elevation myocardial infarction. His LVEF reduced from 55 to 60% on admission to 25 to 30% with his STEMI. Left heart catheter revealed disease in the proximal to mid LAD, OM 2, and total occlusion of the RCA. He underwent CABG on 07/22/2019  Assessment & Plan    Acute anterior wall MI/NSTEMI Patient admitted with AMS and PNA found to have anterior wall STEMI s/p CABG 07/22/19 - Continue ASA - No plavix due to allergy - Continue Lipitor and coreg  Hyperlipidemia - Continue Lipitor  Acute combined systolic and diastolic heart failure - Minimal lower leg edema on exam - Continue spiro, Coreg, and Entresto. Entresto increased to 49-51 mg  AKI - Resolved - Creatinine 1.10 >> AM labs pending  Hypertension - Some elevated pressures. 137/66 today - Continue Coreg and Entresto.   For questions or updates, please contact [place] Please consult [email or web address] for contact info under        Signed, Jadae Steinke Ninfa Meeker, PA-C  07/29/2019, 8:24 AM

## 2019-07-29 NOTE — Discharge Instructions (Signed)
Discharge Instructions:  1. You may shower, please wash incisions daily with soap and water and keep dry.  If you wish to cover wounds with dressing you may do so but please keep clean and change daily.  No tub baths or swimming until incisions have completely healed.  If your incisions become red or develop any drainage please call our office at [phone]  2. No Driving until cleared by Dr. Abran Duke office and you are no longer using narcotic pain medications  3. Monitor your weight daily.. Please use the same scale and weigh at same time... If you gain 3-5 lbs in 48 hours with associated lower extremity swelling, please contact our office at [phone]  4. Fever of 101.5 for at least 24 hours with no source, please contact our office at [phone]   Coronary Artery Bypass Grafting, Care After This sheet gives you information about how to care for yourself after your procedure. Your doctor may also give you more specific instructions. If you have problems or questions, call your doctor. What can I expect after the procedure? After the procedure, it is common to:  Feel sick to your stomach (nauseous).  Not want to eat as much as normal (lack of appetite).  Have trouble pooping (constipation).  Have weakness and tiredness (fatigue).  Feel sad (depressed) or grouchy (irritable).  Have pain or discomfort around the cuts from surgery (incisions). Follow these instructions at home: Medicines  Take over-the-counter and prescription medicines only as told by your doctor. Do not stop taking medicines or start any new medicines unless your doctor says it is okay.  If you were prescribed an antibiotic medicine, take it as told by your doctor. Do not stop taking the antibiotic even if you start to feel better. Incision care   Follow instructions from your doctor about how to take care of your cuts from surgery. Make sure you: ? Wash your hands with soap and water before and after  you change your bandage (dressing). If you cannot use soap and water, use hand sanitizer. ? Change your bandage as told by your doctor. ? Leave stitches (sutures), skin glue, or skin tape (adhesive) strips in place. They may need to stay in place for 2 weeks or longer. If tape strips get loose and curl up, you may trim the loose edges. Do not remove tape strips completely unless your doctor says it is okay.  Make sure the surgery cuts are clean, dry, and protected.  Check your cut areas every day for signs of infection. Check for: ? More redness, swelling, or pain. ? More fluid or blood. ? Warmth. ? Pus or a bad smell.  If cuts were made in your legs: ? Avoid crossing your legs. ? Avoid sitting for long periods of time. Change positions every 30 minutes. ? Raise (elevate) your legs when you are sitting. Bathing  Do not take baths, swim, or use a hot tub until your doctor says it is okay.  Only take sponge baths. Pat the surgery cuts dry. Do not rub the cuts to dry.  Ask your doctor when you can shower. Eating and drinking   Eat foods that are high in fiber, such as beans, nuts, whole grains, and raw fruits and vegetables. Any meats you eat should be lean cut. Avoid canned, processed, and fried foods. This can help prevent trouble pooping. This is also a part of a heart-healthy diet.  Drink enough fluid to keep your pee (urine) pale yellow.  Do  not drink alcohol until you are fully recovered. Ask your doctor when it is safe to drink alcohol. Activity  Rest and limit your activity as told by your doctor. You may be told to: ? Stop any activity right away if you have chest pain, shortness of breath, irregular heartbeats, or dizziness. Get help right away if you have any of these symptoms. ? Move around often for short periods or take short walks as told by your doctor. Slowly increase your activities. ? Avoid lifting, pushing, or pulling anything that is heavier than 10 lb (4.5 kg)  for at least 6 weeks or as told by your doctor.  Do physical therapy or a cardiac rehab (cardiac rehabilitation) program as told by your doctor. ? Physical therapy involves doing exercises to maintain movement and build strength and endurance. ? A cardiac rehab program includes:  Exercise training.  Education.  Counseling.  Do not drive until your doctor says it is okay.  Ask your doctor when you can go back to work.  Ask your doctor when you can be sexually active. General instructions  Do not drive or use heavy machinery while taking prescription pain medicine.  Do not use any products that contain nicotine or tobacco. These include cigarettes, e-cigarettes, and chewing tobacco. If you need help quitting, ask your doctor.  Take 2-3 deep breaths every few hours during the day while you get better. This helps expand your lungs and prevent problems.  If you were given a device called an incentive spirometer, use it several times a day to practice deep breathing. Support your chest with a pillow or your arms when you take deep breaths or cough.  Wear compression stockings as told by your doctor.  Weigh yourself every day. This helps to see if your body is holding (retaining) fluid that may make your heart and lungs work harder.  Keep all follow-up visits as told by your doctor. This is important. Contact a doctor if:  You have more redness, swelling, or pain around any cut.  You have more fluid or blood coming from any cut.  Any cut feels warm to the touch.  You have pus or a bad smell coming from any cut.  You have a fever.  You have swelling in your ankles or legs.  You have pain in your legs.  You gain 2 lb (0.9 kg) or more a day.  You feel sick to your stomach or you throw up (vomit).  You have watery poop (diarrhea). Get help right away if:  You have chest pain that goes to your jaw or arms.  You are short of breath.  You have a fast or irregular  heartbeat.  You notice a "clicking" in your breastbone (sternum) when you move.  You have any signs of a stroke. "BE FAST" is an easy way to remember the main warning signs: ? B - Balance. Signs are dizziness, sudden trouble walking, or loss of balance. ? E - Eyes. Signs are trouble seeing or a change in how you see. ? F - Face. Signs are sudden weakness or loss of feeling of the face, or the face or eyelid drooping on one side. ? A - Arms. Signs are weakness or loss of feeling in an arm. This happens suddenly and usually on one side of the body. ? S - Speech. Signs are sudden trouble speaking, slurred speech, or trouble understanding what people say. ? T - Time. Time to call emergency services. Write down  what time symptoms started.  You have other signs of a stroke, such as: ? A sudden, very bad headache with no known cause. ? Feeling sick to your stomach. ? Throwing up. ? Jerky movements you cannot control (seizure). These symptoms may be an emergency. Do not wait to see if the symptoms will go away. Get medical help right away. Call your local emergency services (911 in the [place] Do not drive yourself to the hospital. Summary  After the procedure, it is common to have pain or discomfort in the cuts from surgery (incisions).  Do not take baths, swim, or use a hot tub until your doctor says it is okay.  Slowly increase your activities. You may need physical therapy or cardiac rehab.  Weigh yourself every day. This helps to see if your body is holding fluid. This information is not intended to replace advice given to you by your health care provider. Make sure you discuss any questions you have with your health care provider. Document Released: 10/13/2013 Document Revised: 06/17/2018 Document Reviewed: 06/17/2018 Elsevier Patient Education  [place].   5. Activity- up as tolerated, please walk at least 3 times per day.  Avoid strenuous activity, no lifting, pushing, or  pulling with your arms over 8-10 lbs for a minimum of 6 weeks  6. If any questions or concerns arise, please do not hesitate to contact our office at [phone]

## 2019-07-29 NOTE — Progress Notes (Signed)
Discussed sternal precautions, IS, tobacco, diet, exercise, and CRPII. Voiced understanding. Will refer to [place]. He does not have a smart phone or tablet so no virtual option.  [id number] [place], ACSM 10:35 AM 07/29/2019

## 2019-07-29 NOTE — Progress Notes (Signed)
      [address]       [place] [address]             [phone]      7 Days Post-Op Procedure(s): OFF PUMP CORONARY ARTERY BYPASS GRAFTING (CABG) times two using left internal mammary artery and right leg saphenous vein Subjective: Ready to go home today.   Objective: Vital signs in last 24 hours: Temp:  [97.6 F (36.4 C)-98 F (36.7 C)] 98 F (36.7 C) (10/07 0439) Pulse Rate:  [73-84] 77 (10/07 0439) Cardiac Rhythm: Normal sinus rhythm (10/06 1900) Resp:  [15-20] 15 (10/07 0439) BP: (135-156)/(61-75) 156/73 (10/07 0439) SpO2:  [90 %-93 %] 90 % (10/07 0439) Weight:  [79.5 kg] 79.5 kg (10/07 0439)     Intake/Output from previous day: 10/06 0701 - 10/07 0700 In: -  Out: 375 [Urine:375] Intake/Output this shift: No intake/output data recorded.  General appearance: alert, cooperative and no distress Heart: regular rate and rhythm, S1, S2 normal, no murmur, click, rub or gallop Lungs: clear to auscultation bilaterally Abdomen: soft, non-tender; bowel sounds normal; no masses,  no organomegaly Extremities: extremities normal, atraumatic, no cyanosis or edema Wound: clean and dry  Lab Results: Recent Labs    07/27/19 0236 07/28/19 0240  WBC 8.5 6.7  HGB 10.9* 10.7*  HCT 33.0* 33.1*  PLT 226 258   BMET:  Recent Labs    07/27/19 0236 07/28/19 0240  NA 140 143  K 3.2* 3.6  CL 107 106  CO2 25 25  GLUCOSE 104* 94  BUN 17 17  CREATININE 1.17 1.10  CALCIUM 7.8* 8.2*    PT/INR: No results for input(s): LABPROT, INR in the last 72 hours. ABG    Component Value Date/Time   PHART 7.418 07/23/2019 0143   HCO3 22.2 07/23/2019 0143   TCO2 23 07/23/2019 0143   ACIDBASEDEF 2.0 07/23/2019 0143   O2SAT 93.0 07/23/2019 0143   CBG (last 3)  Recent Labs    07/28/19 1943 07/29/19 0045 07/29/19 0438  GLUCAP 145* 100* 95    Assessment/Plan: S/P Procedure(s): OFF PUMP CORONARY ARTERY BYPASS GRAFTING (CABG) times two using left internal  mammary artery and right leg saphenous vein  1. CV-Entresto initiated for hypertension. First degree heart block with HR in the 80s. Continue ASA, statin.  2. Pulm- tolerating room air with good oxygen saturation. 3. Renal- creatine 1.10, electrolytes okay. Does still have 1-2+ pitting pedal edema but weight is below baseline. On Spirolactone.  4. H and H 10.7/33.1, expected acute blood loss anemia 5. Endo-blood glucose well controlled 6. Nausea-zofran PRN, maintain a BRAT diet. Continue stool softeners. + BM today.  7. Chest pain- sternal precautions reviewed. Continue gabapentin, oxycodone, and morphine PRN.   Plan: Discharge today.    LOS: 13 days    Philip Hurst 07/29/2019

## 2019-07-29 NOTE — Progress Notes (Signed)
Occupational Therapy Treatment Patient Details Name: Philip Hurst MRN: [id number] DOB: [birth year] Today's Date: 07/29/2019    History of present illness 78 year old male presenting with sepsis, now with concern for PE vs ACS. Patient was taken to an urgent [facility] 9/27 due to EKG changes overnight and rising troponins. Found to have total occlusion of RCA with collaterals from left circ. LAD 50-90%. CABG x 2 on 07/22/19   OT comments  Patient supine in bed and agreeable to OT.  Continues to be limited by decreased activity tolerance, generalized weakness, and sternal precautions/pain.  He currently requires min assist to min guard for transfers (dependent of fatigue level), min assist for LB ADLs.  Reviewed compensatory techniques and energy conservation techniques to maximize independence (especially sitting for ADLs and slowly building up act tolerance).  Will have support as needed at home for dc from family.  Continue to recommend [place].    Follow Up Recommendations  Home health OT;Supervision/Assistance - 24 hour    Equipment Recommendations  None recommended by OT    Recommendations for Other Services      Precautions / Restrictions Precautions Precautions: Fall;Sternal Precaution Booklet Issued: Yes (comment) Restrictions Weight Bearing Restrictions: Yes Other Position/Activity Restrictions: sternal precautions       Mobility Bed Mobility Overal bed mobility: Needs Assistance Bed Mobility: Rolling;Sidelying to Sit;Sit to Sidelying Rolling: Min guard Sidelying to sit: Min guard;HOB elevated     Sit to sidelying: Min assist General bed mobility comments: cueing for technique, min guard for trunk support; returned to supine with min assist for LEs  Transfers Overall transfer level: Needs assistance Equipment used: Rolling walker (2 wheeled) Transfers: Sit to/from Stand Sit to Stand: Min assist         General transfer comment: min assist to steady and transition weight  forward, fatigued during session today    Balance Overall balance assessment: Needs assistance Sitting-balance support: No upper extremity supported;Feet supported Sitting balance-Leahy Scale: Good     Standing balance support: Bilateral upper extremity supported;During functional activity Standing balance-Leahy Scale: Poor Standing balance comment: relaint on B UE support                           ADL either performed or assessed with clinical judgement   ADL Overall ADL's : Needs assistance/impaired             Lower Body Bathing: Minimal assistance;Sit to/from stand Lower Body Bathing Details (indicate cue type and reason): reviewed seated for safety/energy conservation, reviewed assist as needed or use of long sponge to reach feet     Lower Body Dressing: Minimal assistance;Sit to/from stand Lower Body Dressing Details (indicate cue type and reason): reviewed seated, continnues to require assist with socks but plans to wear slip on shoes for ease/energy conservation; will have assist as needed Toilet Transfer: Minimal assistance;Ambulation;RW Toilet Transfer Details (indicate cue type and reason): simulated in room, min assist to steady and translate weight forward      Tub/ Shower Transfer: Walk-in shower;Minimal assistance;Ambulation;3 in 1;Rolling walker Tub/Shower Transfer Details (indicate cue type and reason): reviewed technique with reverse step using RW to 3:1 with assist from family, min assist for safety/technique Functional mobility during ADLs: Minimal assistance;Min guard;Rolling walker General ADL Comments: pt continues to be limited by pain and decreased activity tolerance     Vision   Vision Assessment?: No apparent visual deficits   Perception     Praxis  Cognition Arousal/Alertness: Awake/alert Behavior During Therapy: WFL for tasks assessed/performed Overall Cognitive Status: Within Functional Limits for tasks assessed                                           Exercises     Shoulder Instructions       General Comments BP pre 137/66, post 159/69; HR 80-90     Pertinent Vitals/ Pain       Pain Assessment: Faces Faces Pain Scale: Hurts little more Pain Location: sternum, low back Pain Descriptors / Indicators: Aching;Discomfort;Grimacing Pain Intervention(s): Limited activity within patient's tolerance;Monitored during session;Repositioned;Patient requesting pain meds-RN notified  Home Living                                          Prior Functioning/Environment              Frequency  Min 2X/week        Progress Toward Goals  OT Goals(current goals can now be found in the care plan section)  Progress towards OT goals: Progressing toward goals  Acute Rehab OT Goals Patient Stated Goal: to return to independent OT Goal Formulation: With patient  Plan Discharge plan remains appropriate;Frequency remains appropriate    Co-evaluation                 AM-PAC OT "6 Clicks" Daily Activity     Outcome Measure   Help from another person eating meals?: None Help from another person taking care of personal grooming?: None Help from another person toileting, which includes using toliet, bedpan, or urinal?: A Lot Help from another person bathing (including washing, rinsing, drying)?: A Little Help from another person to put on and taking off regular upper body clothing?: A Little Help from another person to put on and taking off regular lower body clothing?: A Little 6 Click Score: 19    End of Session Equipment Utilized During Treatment: Rolling walker;Gait belt  OT Visit Diagnosis: Muscle weakness (generalized) (M62.81);Unsteadiness on feet (R26.81)   Activity Tolerance Patient limited by fatigue   Patient Left in bed;with call bell/phone within reach   Nurse Communication Mobility status;Patient requests pain meds        Time: 0842-0901 OT  Time Calculation (min): 19 min  Charges: OT General Charges $OT Visit: 1 Visit OT Treatments $Self Care/Home Management : 8-22 mins  Delight Stare, OT Acute Rehabilitation Services Pager [phone] Office [phone]    Delight Stare 07/29/2019, 9:32 AM

## 2019-07-30 ENCOUNTER — Telehealth (HOSPITAL_COMMUNITY): Payer: Self-pay

## 2019-07-30 NOTE — Telephone Encounter (Signed)
Called patient to see if he is interested in the Cardiac Rehab Program. Patient expressed interest. Explained scheduling process and went over insurance, patient verbalized understanding. Will contact patient for scheduling once f/u has been completed.  °

## 2019-07-30 NOTE — Telephone Encounter (Signed)
Pt insurance is active and benefits verified through [facility] Medicare. Co-pay $20.00, DED $0.00/$0.00 met, out of pocket $3,600.00/$100.00 met, co-insurance 0%. No pre-authorization required. Passport, 07/30/2019 @ 939AM, VMT#[id number]  Will contact patient to see if he is interested in the Cardiac Rehab Program. If interested, patient will need to complete follow up appt. Once completed, patient will be contacted for scheduling upon review by the RN Navigator.

## 2019-07-31 ENCOUNTER — Telehealth: Payer: Self-pay

## 2019-07-31 NOTE — Telephone Encounter (Signed)
Patient c/o swelling in feet and ankles this AM. He denies any SOB, fevers or cough. His weight today was 174 lbs. He has had no significant weight gain since hospital discharge/ His incision sites look good. He has not been elevating feet at heart level when sitting. I recommended that he elevated his feet and ankles and heart level. Try seating in his recliner and add pillow under legs for extra elevation. If he continues to have swelling in lower extremities to call back. He might need  lasix for a week. He is currently taking spironolactone 25 mg 1/2 tab daily.

## 2019-08-05 NOTE — Telephone Encounter (Signed)
Ok sounds good. Thank you!

## 2019-08-07 ENCOUNTER — Encounter: Payer: Self-pay | Admitting: Thoracic Surgery (Cardiothoracic Vascular Surgery)

## 2019-08-07 ENCOUNTER — Other Ambulatory Visit: Payer: Self-pay

## 2019-08-07 ENCOUNTER — Ambulatory Visit (INDEPENDENT_AMBULATORY_CARE_PROVIDER_SITE_OTHER): Payer: Self-pay | Admitting: Thoracic Surgery (Cardiothoracic Vascular Surgery)

## 2019-08-07 VITALS — BP 135/70 | HR 72 | Temp 97.8°F | Resp 20 | Ht 63.0 in | Wt 168.0 lb

## 2019-08-07 DIAGNOSIS — Z951 Presence of aortocoronary bypass graft: Secondary | ICD-10-CM

## 2019-08-07 DIAGNOSIS — I2510 Atherosclerotic heart disease of native coronary artery without angina pectoris: Secondary | ICD-10-CM

## 2019-08-07 NOTE — Progress Notes (Signed)
      [address]       [place] [address]             [phone]        Eisa W [place] Medical Record [id number] Date of Birth: [birth year]  Referring: Jettie Booze, MD Primary Care: Leonard Downing, MD Primary Cardiologist:Jonathan Gwenlyn Found, MD  Reason for visit:   follow-up  History of Present Illness:     Mr. Elley comes in for his first follow-up appointment.  He states that he has been doing well, ambulating without difficulty or shortness of breath.  His pain remains at 4-5.  He admits that he has some opioid dependence, but would like to get off of the pain medication.  Physical Exam: BP 135/70   Pulse 72   Temp 97.8 F (36.6 C) (Skin)   Resp 20   Ht 5\' 3"  (1.6 m)   Wt 168 lb (76.2 kg)   SpO2 90% Comment: RA  BMI 29.76 kg/m   Alert NAD RRR, no murmur.  Incision clean.  Sternum stable Abdomen soft, NT/ND trace peripheral edema   Diagnostic Studies & Laboratory data:      Assessment / Plan:   78 year old male status post CABG.  Doing well Will return to clinic in 1 month with a chest x-ray.   Lajuana Matte 08/07/2019 2:49 PM

## 2019-08-13 ENCOUNTER — Telehealth: Payer: Self-pay | Admitting: Cardiology

## 2019-08-13 ENCOUNTER — Ambulatory Visit (INDEPENDENT_AMBULATORY_CARE_PROVIDER_SITE_OTHER): Payer: Medicare Other | Admitting: Cardiology

## 2019-08-13 ENCOUNTER — Encounter: Payer: Self-pay | Admitting: Cardiology

## 2019-08-13 ENCOUNTER — Other Ambulatory Visit: Payer: Self-pay

## 2019-08-13 DIAGNOSIS — Z951 Presence of aortocoronary bypass graft: Secondary | ICD-10-CM

## 2019-08-13 DIAGNOSIS — I255 Ischemic cardiomyopathy: Secondary | ICD-10-CM

## 2019-08-13 DIAGNOSIS — N179 Acute kidney failure, unspecified: Secondary | ICD-10-CM | POA: Insufficient documentation

## 2019-08-13 DIAGNOSIS — I739 Peripheral vascular disease, unspecified: Secondary | ICD-10-CM | POA: Diagnosis not present

## 2019-08-13 DIAGNOSIS — I213 ST elevation (STEMI) myocardial infarction of unspecified site: Secondary | ICD-10-CM | POA: Insufficient documentation

## 2019-08-13 DIAGNOSIS — E785 Hyperlipidemia, unspecified: Secondary | ICD-10-CM

## 2019-08-13 DIAGNOSIS — I2102 ST elevation (STEMI) myocardial infarction involving left anterior descending coronary artery: Secondary | ICD-10-CM

## 2019-08-13 LAB — CBC WITH DIFFERENTIAL/PLATELET
Basophils Absolute: 0.1 10*3/uL (ref 0.0–0.2)
Basos: 1 %
EOS (ABSOLUTE): 0.2 10*3/uL (ref 0.0–0.4)
Eos: 2 %
Hematocrit: 33.9 % — ABNORMAL LOW (ref 37.5–51.0)
Hemoglobin: 10.9 g/dL — ABNORMAL LOW (ref 13.0–17.7)
Immature Grans (Abs): 0 10*3/uL (ref 0.0–0.1)
Immature Granulocytes: 0 %
Lymphocytes Absolute: 1.8 10*3/uL (ref 0.7–3.1)
Lymphs: 25 %
MCH: 30.2 pg (ref 26.6–33.0)
MCHC: 32.2 g/dL (ref 31.5–35.7)
MCV: 94 fL (ref 79–97)
Monocytes Absolute: 0.6 10*3/uL (ref 0.1–0.9)
Monocytes: 9 %
Neutrophils Absolute: 4.4 10*3/uL (ref 1.4–7.0)
Neutrophils: 63 %
Platelets: 308 10*3/uL (ref 150–450)
RBC: 3.61 x10E6/uL — ABNORMAL LOW (ref 4.14–5.80)
RDW: 13.7 % (ref 11.6–15.4)
WBC: 7 10*3/uL (ref 3.4–10.8)

## 2019-08-13 LAB — BASIC METABOLIC PANEL
BUN/Creatinine Ratio: 18 (ref 10–24)
BUN: 24 mg/dL (ref 8–27)
CO2: 23 mmol/L (ref 20–29)
Calcium: 9 mg/dL (ref 8.6–10.2)
Chloride: 101 mmol/L (ref 96–106)
Creatinine, Ser: 1.36 mg/dL — ABNORMAL HIGH (ref 0.76–1.27)
GFR calc Af Amer: 57 mL/min/{1.73_m2} — ABNORMAL LOW (ref >59)
GFR calc non Af Amer: 49 mL/min/{1.73_m2} — ABNORMAL LOW (ref >59)
Glucose: 120 mg/dL — ABNORMAL HIGH (ref 65–99)
Potassium: 4.8 mmol/L (ref 3.5–5.2)
Sodium: 138 mmol/L (ref 134–144)

## 2019-08-13 MED ORDER — CARVEDILOL 12.5 MG PO TABS: 12.5000 mg | ORAL_TABLET | Freq: Two times a day (BID) | ORAL | 1 refills | Status: DC

## 2019-08-13 MED ORDER — ENTRESTO 24-26 MG PO TABS: 1.0000 | ORAL_TABLET | Freq: Two times a day (BID) | ORAL | 3 refills | Status: DC

## 2019-08-13 NOTE — Assessment & Plan Note (Signed)
New LVD -25% by echo 07/19/2019

## 2019-08-13 NOTE — Assessment & Plan Note (Signed)
Pt admitted with CAP and sepsis 07/16/2019- developed respiratory failure and STEMI 07/19/2019- Ultimately had CABG x 2 07/22/2019

## 2019-08-13 NOTE — Telephone Encounter (Signed)
Attempted to contact patient wife to notify her that we could contact her in the exam room- but due to policy only the patient should be brought back, unless any mental disorders are involved, she did not answer unable to leave message.  Patient appointment is at 11:00. Will route to primary to make aware that she may try to come back.

## 2019-08-13 NOTE — Assessment & Plan Note (Signed)
On statin Rx-check lipids, LFTs 2 months

## 2019-08-13 NOTE — Assessment & Plan Note (Signed)
Hypotensive today- systolic 84 by me. Decrease Entresto and Coreg

## 2019-08-13 NOTE — Assessment & Plan Note (Signed)
LIMA-LAD, SVG-PDA 07/22/2019

## 2019-08-13 NOTE — Progress Notes (Signed)
Cardiology Office Note:    Date:  08/13/2019   ID:  Philip Hurst, Philip Hurst 06/07/1941, MRN [id number]  PCP:  Leonard Downing, MD  Cardiologist:  Quay Burow, MD  Electrophysiologist:  None   Referring MD: Leonard Downing, *   No chief complaint on file. POst CABG f/u  History of Present Illness:    Philip Hurst is a 78 y.o. male with a hx of CAD and PVD.  He has been followed by Dr. Gwenlyn Found.  He had a catheterization in the past showing an occluded RCA with left-to-right collaterals.  His LV function was normal.  He had a left popliteal stent placed in 2014, this was occluded in 2016 and after consultation with Dr. Trula Slade it was decided to treat him medically.  The patient presented to [facility] 07/16/2019 with community-acquired pneumonia and sepsis.  This was complicated by acute renal injury and mental status changes.  His troponins were noted to be elevated.  Initially this was felt to be secondary to demand ischemia.  With his acute renal injury no urgent plans for catheterization were considered.  On 07/19/2019 the patient had worsening respiratory status.  His EKG showed new ST elevation.  Echocardiogram revealed an EF of 25% with anterior hypokinesis.  After discussion with the patient's wife it was decided to proceed with diagnostic catheterization.  This revealed a 70% proximal LAD, 90% mid LAD, known occluded proximal RCA with collaterals from the circumflex, and a 60% OM narrowing.  Patient was stabilized.  His renal function continue to improve and on 07/22/2019 he underwent CABG x2 with an LIMA to LAD and SVG to PDA.  He tolerated this well.  His creatinine was 1.26 by discharge.  He was placed on Aldactone, Coreg, and Entresto as well as high-dose statin.  He presents to the office today for follow-up.  He says he feels a little bit better each day.  He is not had syncope or near syncope.  He does say that he thinks will have trouble affording the [facility] and may not be  able to take it.  Past Medical History:  Diagnosis Date  . Arthritis    "back, joints in right hand" (08/18/2013)  . CAD (coronary artery disease), single vessel disease with collaterals, medical therapy 09/12/2011  . Chronic lower back pain   . Claudication of left lower extremity, ABI Lt 0.52 -lifestyle limiting  ABI rt 0.90 01/30/2013  . Coronary artery disease    OCCLUDED RCA  . Hyperlipidemia   . Hypertension   . Normal cardiac stress test, No ischemia 12/15/12 01/30/2013  . PAD (peripheral artery disease) ([place])     Past Surgical History:  Procedure Laterality Date  . BACK SURGERY    . CARDIAC CATHETERIZATION  @2002    1 vessel disease w/collaterals,medical therapy  . CARDIOVASCULAR STRESS TEST  12/07/2008   EF 58%, NO ISCHEMIA  . CATARACT EXTRACTION W/ INTRAOCULAR LENS  IMPLANT, BILATERAL Bilateral   . CORONARY ARTERY BYPASS GRAFT  07/22/2019   Procedure: OFF PUMP CORONARY ARTERY BYPASS GRAFTING (CABG) times two using left internal mammary artery and right leg saphenous vein;  Surgeon: Lajuana Matte, MD;  Location: [place] OR;  Service: Open Heart Surgery;;  . 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 LEFT HEART CATH AND CORONARY ANGIOGRAPHY N/A 07/19/2019   Procedure: LEFT HEART CATH AND CORONARY ANGIOGRAPHY;  Surgeon: Belva Crome, MD;  Location: [place] CV LAB;  Service: Cardiovascular;  Laterality:  N/A;  . lower extremities arterial doppler  02/16/2013   bilateral ABIs normal values w/o evidence arterial insuff. at rest; left  lower extrem. norm. left popliteal artery stent norm. patency  . LOWER EXTREMITY ANGIOGRAM Left 08/18/2013   Procedure: LOWER EXTREMITY ANGIOGRAM;  Surgeon: Lorretta Harp, MD;  Location: [facility] CATH LAB;  Service: Cardiovascular;  Laterality: Left;  . LUMBAR DISC SURGERY  1990's   "herniated disc" (08/18/2013)  . PERCUTANEOUS STENT INTERVENTION  01/30/2013   Procedure: PERCUTANEOUS STENT INTERVENTION;  Surgeon: Lorretta Harp, MD;  Location: [facility] CATH LAB;  Service: Cardiovascular;;  . PERIPHERAL VASCULAR CATHETERIZATION N/A 09/05/2015   Procedure: Lower Extremity Angiography;  Surgeon: Lorretta Harp, MD;  Location: [place] CV LAB;  Service: Cardiovascular;  Laterality: N/A;  . POPLITEAL ARTERY STENT Left 01/30/13  . POPLITEAL ARTERY STENT Left 10/'28/2014   successful Viabahn covered stent placement within the previously placed IDEV  stents/notes 08/18/2013  . POSTERIOR LUMBAR FUSION  1990's  . TONSILLECTOMY    . WRIST FUSION Right 1990's?   "first OR didn't correct problem" (08/18/2013)    Current Medications: Current Meds  Medication Sig  . aspirin EC 325 MG EC tablet Take 1 tablet (325 mg total) by mouth daily.  Marland Kitchen atorvastatin (LIPITOR) 80 MG tablet Take 1 tablet (80 mg total) by mouth daily at 6 PM.  . carvedilol (COREG) 25 MG tablet Take 1 tablet (25 mg total) by mouth 2 (two) times daily with a meal.  . gabapentin (NEURONTIN) 100 MG capsule Take 1 capsule (100 mg total) by mouth at bedtime.  . ondansetron (ZOFRAN) 4 MG tablet Take 1 tablet (4 mg total) by mouth every 8 (eight) hours as needed for nausea or vomiting.  Marland Kitchen oxyCODONE (OXY IR/ROXICODONE) 5 MG immediate release tablet Take 1 tablet (5 mg total) by mouth every 6 (six) hours as needed for severe pain.  . sacubitril-valsartan (ENTRESTO) 49-51 MG Take 1 tablet by mouth 2 (two) times daily.  Marland Kitchen spironolactone (ALDACTONE) 25 MG tablet Take 0.5 tablets (12.5 mg total) by mouth daily.     Allergies:   Plavix [clopidogrel bisulfate]   Social History   Socioeconomic History  . Marital status: Married    Spouse name: [name]  . Number of children: 4  . Years of education: Not on file  . Highest education level: Not on file  Occupational History  . Not on file  Social Needs  . Financial resource strain: Not on file  . Food insecurity    Worry: Not on file    Inability: Not on file  . Transportation needs    Medical: Not on file     Non-medical: Not on file  Tobacco Use  . Smoking status: Former Smoker    Packs/day: 1.00    Years: 44.00    Pack years: 44.00    Types: Cigarettes    Quit date: 08/26/1997    Years since quitting: 21.9  . Smokeless tobacco: Former Systems developer    Types: Chew  Substance and Sexual Activity  . Alcohol use: Yes    Comment: 08/18/2013 "glass of wine 5-6 months ago; very rare"  . Drug use: No  . Sexual activity: Never  Lifestyle  . Physical activity    Days per week: Not on file    Minutes per session: Not on file  . Stress: Not on file  Relationships  . Social Herbalist on phone: Not on file    Gets together:  Not on file    Attends religious service: Not on file    Active member of club or organization: Not on file    Attends meetings of clubs or organizations: Not on file    Relationship status: Not on file  Other Topics Concern  . Not on file  Social History Narrative  . Not on file     Family History: The patient's family history includes Cancer in his father; Coronary artery disease in his brother; Diabetes in his brother; Seizures in his mother.  ROS:   Please see the history of present illness.     All other systems reviewed and are negative.  EKGs/Labs/Other Studies Reviewed:    The following studies were reviewed today: Echo 07/19/2019 Cath 07/19/2019  EKG:  EKG is ordered today.  The ekg ordered today demonstrates NSR, HR 66, anterior lateral TWI  Recent Labs: 07/22/2019: ALT 299 07/23/2019: Magnesium 2.0 07/28/2019: Hemoglobin 10.7; Platelets 258 07/29/2019: BUN 14; Creatinine, Ser 1.26; Potassium 3.7; Sodium 139  Recent Lipid Panel No results found for: CHOL, TRIG, HDL, CHOLHDL, VLDL, LDLCALC, LDLDIRECT  Physical Exam:    VS:  BP (!) 104/56   Pulse 67   Temp (!) 97.3 F (36.3 C)   Ht 5\' 3"  (1.6 m)   Wt 164 lb 12.8 oz (74.8 kg)   SpO2 99%   BMI 29.19 kg/m     Wt Readings from Last 3 Encounters:  08/13/19 164 lb 12.8 oz (74.8 kg)  08/07/19 168  lb (76.2 kg)  07/29/19 175 lb 4.3 oz (79.5 kg)     GEN:  Well nourished, well developed in no acute distress-pale HEENT: Normal NECK: No JVD; No carotid bruits LYMPHATICS: No lymphadenopathy CARDIAC: RRR, no murmurs, rubs, gallops RESPIRATORY:  Clear to auscultation without rales, wheezing or rhonchi  ABDOMEN: Soft, non-tender, non-distended MUSCULOSKELETAL:  No edema; No deformity  SKIN: Warm and dry NEUROLOGIC:  Alert and oriented x 3 PSYCHIATRIC:  Normal affect   ASSESSMENT:    STEMI (ST elevation myocardial infarction) ([place]) Pt admitted with CAP and sepsis 07/16/2019- developed respiratory failure and STEMI 07/19/2019- Ultimately had CABG x 2 07/22/2019  Ischemic cardiomyopathy New LVD -25% by echo 07/19/2019  S/P CABG x 2 LIMA-LAD, 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 Decrease Entresto and Coreg  AKI (acute kidney injury) ([place]) On admission Sept 2020- SCr improved to 1.26 at discharge  PLAN:    I decreased his Entresto and Coreg. Check BMP and CBC today.  F/U with me in two weeks.  He was given information on Entresto patient assistance. Check echo in 3 months, then f/u with Dr Gwenlyn Found.    Medication Adjustments/Labs and Tests Ordered: Current medicines are reviewed at length with the patient today.  Concerns regarding medicines are outlined above.  Orders Placed This Encounter  Procedures  . CBC with Differential  . Basic Metabolic Panel (BMET)  . EKG 12-Lead   No orders of the defined types were placed in this encounter.   Patient Instructions  Medication Instructions:  DECREASE Entresto to 24/26mg  Take 1 tablet twice a day  DECREASE Coreg to 12.5mg  Take 1 tablet twice a day  *If you need a refill on your cardiac medications before your next appointment, please  call your pharmacy*  Lab Work: Your physician recommends that you return for  lab work in: TODAY-BMET, CBC If you have labs (blood work) drawn today and your tests are completely normal, you will receive your results only by: Marland Kitchen MyChart Message (if you have MyChart) OR . A paper copy in the mail If you have any lab test that is abnormal or we need to change your treatment, we will call you to review the results.  Testing/Procedures: NONE   Follow-Up: At [facility], you and your health needs are our priority.  As part of our continuing mission to provide you with exceptional heart care, we have created designated Provider Care Teams.  These Care Teams include your primary Cardiologist (physician) and Advanced Practice Providers (APPs -  Physician Assistants and Nurse Practitioners) who all work together to provide you with the care you need, when you need it.  Your next appointment:   2 WEEKS   The format for your next appointment:   In Person  Provider:   Kerin Ransom, PA-C  Other Instructions [facility] Patient assistance forms given    Signed, Kerin Ransom, Hershal Coria  08/13/2019 11:27 AM    [place]

## 2019-08-13 NOTE — Assessment & Plan Note (Signed)
On admission Sept 2020- SCr improved to 1.26 at discharge

## 2019-08-13 NOTE — Telephone Encounter (Signed)
New Message:        Pt's wife called. She said she would need to come in with pt today for his appt with [facility]. She said she needs to be with pt, he just recently had a heart attack.

## 2019-08-13 NOTE — Patient Instructions (Signed)
Medication Instructions:  DECREASE Entresto to 24/26mg  Take 1 tablet twice a day  DECREASE Coreg to 12.5mg  Take 1 tablet twice a day  *If you need a refill on your cardiac medications before your next appointment, please call your pharmacy*  Lab Work: Your physician recommends that you return for lab work in: TODAY-BMET, CBC If you have labs (blood work) drawn today and your tests are completely normal, you will receive your results only by: Marland Kitchen MyChart Message (if you have MyChart) OR . A paper copy in the mail If you have any lab test that is abnormal or we need to change your treatment, we will call you to review the results.  Testing/Procedures: NONE   Follow-Up: At [facility], you and your health needs are our priority.  As part of our continuing mission to provide you with exceptional heart care, we have created designated Provider Care Teams.  These Care Teams include your primary Cardiologist (physician) and Advanced Practice Providers (APPs -  Physician Assistants and Nurse Practitioners) who all work together to provide you with the care you need, when you need it.  Your next appointment:   2 WEEKS   The format for your next appointment:   In Person  Provider:   Kerin Ransom, PA-C  Other Instructions [facility] Patient assistance forms given

## 2019-08-13 NOTE — Assessment & Plan Note (Signed)
Known occluded Lt popliteal stent 2016- medical rx

## 2019-08-13 NOTE — Telephone Encounter (Signed)
I called to speak with Ms Koller as she was not allowed to come back with her husband.  Left message on their cell phone.  Kerin Ransom PA-C 08/13/2019 1:13 PM

## 2019-08-27 ENCOUNTER — Ambulatory Visit (INDEPENDENT_AMBULATORY_CARE_PROVIDER_SITE_OTHER): Payer: Medicare Other | Admitting: Cardiology

## 2019-08-27 ENCOUNTER — Encounter: Payer: Self-pay | Admitting: Cardiology

## 2019-08-27 ENCOUNTER — Other Ambulatory Visit: Payer: Self-pay

## 2019-08-27 VITALS — BP 136/64 | HR 82 | Temp 97.0°F | Ht 63.0 in | Wt 163.6 lb

## 2019-08-27 DIAGNOSIS — N183 Chronic kidney disease, stage 3 unspecified: Secondary | ICD-10-CM

## 2019-08-27 DIAGNOSIS — I2102 ST elevation (STEMI) myocardial infarction involving left anterior descending coronary artery: Secondary | ICD-10-CM

## 2019-08-27 DIAGNOSIS — Z951 Presence of aortocoronary bypass graft: Secondary | ICD-10-CM

## 2019-08-27 DIAGNOSIS — I959 Hypotension, unspecified: Secondary | ICD-10-CM | POA: Insufficient documentation

## 2019-08-27 DIAGNOSIS — I255 Ischemic cardiomyopathy: Secondary | ICD-10-CM

## 2019-08-27 DIAGNOSIS — I952 Hypotension due to drugs: Secondary | ICD-10-CM

## 2019-08-27 NOTE — Progress Notes (Signed)
Cardiology Office Note:    Date:  08/27/2019   ID:  Cire, Cauthon 1941/10/01, MRN [id number]  PCP:  Leonard Downing, MD  Cardiologist:  Quay Burow, MD  Electrophysiologist:  None   Referring MD: Leonard Downing, *   No chief complaint on file. F/U B/P  History of Present Illness:    Philip Hurst is a pleasant 79 y.o. male with a hx of CAD and PVD.  He has been followed by Dr. Gwenlyn Found.  He had a catheterization in the past showing an occluded RCA with left-to-right collaterals.  His LV function was normal.  He had a left popliteal stent placed in 2014, this was occluded in 2016 and after consultation with Dr. Trula Slade it was decided to treat him medically.  The patient presented to [facility] 07/16/2019 with CAP and sepsis.  This was complicated by AKI and mental status changes.  His troponins were noted to be elevated.  Initially this was felt to be secondary to demand ischemia.  With his acute renal injury no urgent plans for catheterization were considered.   On 07/19/2019 the patient had acute respiratory failure.  His EKG showed new ST elevation.  Echocardiogram revealed an EF of 25% with anterior hypokinesis.  After discussion with the patient's wife it was decided to proceed with diagnostic catheterization.  This revealed a 70% proximal LAD, 90% mid LAD, known occluded proximal RCA with collaterals from the CFX, and a 60% OM narrowing.  Patient was stabilized and on 07/22/2019 he underwent CABG x2 with an LIMA to LAD and SVG to PDA.  He tolerated this well.  His creatinine was 1.26 by discharge.  He was placed on Aldactone, Coreg, and Entresto as well as high-dose statin.    I saw him in the office in follow up 08/13/2019.  His B/P was in the 99991111 systolic.  I cut his Entresto to 24-26 BID, and his Coreg from 25 mg BID to 12.5 mg BID.  F/U labs showed his SCr to be 1.36 and his K+ 4.8 (on Aldactone 12.5 mg).  I had him return today for follow up.  He is doing well- no  chest pain, no SOB, no orthostatic symptoms.   Past Medical History:  Diagnosis Date  . Arthritis    "back, joints in right hand" (08/18/2013)  . CAD (coronary artery disease), single vessel disease with collaterals, medical therapy 09/12/2011  . Chronic lower back pain   . Claudication of left lower extremity, ABI Lt 0.52 -lifestyle limiting  ABI rt 0.90 01/30/2013  . Coronary artery disease    OCCLUDED RCA  . Hyperlipidemia   . Hypertension   . Normal cardiac stress test, No ischemia 12/15/12 01/30/2013  . PAD (peripheral artery disease) ([place])     Past Surgical History:  Procedure Laterality Date  . BACK SURGERY    . CARDIAC CATHETERIZATION  @2002    1 vessel disease w/collaterals,medical therapy  . CARDIOVASCULAR STRESS TEST  12/07/2008   EF 58%, NO ISCHEMIA  . CATARACT EXTRACTION W/ INTRAOCULAR LENS  IMPLANT, BILATERAL Bilateral   . CORONARY ARTERY BYPASS GRAFT  07/22/2019   Procedure: OFF PUMP CORONARY ARTERY BYPASS GRAFTING (CABG) times two using left internal mammary artery and right leg saphenous vein;  Surgeon: Lajuana Matte, MD;  Location: [place] OR;  Service: Open Heart Surgery;;  . 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 LEFT HEART CATH AND CORONARY ANGIOGRAPHY N/A 07/19/2019   Procedure: LEFT HEART  CATH AND CORONARY ANGIOGRAPHY;  Surgeon: Belva Crome, MD;  Location: [place] CV LAB;  Service: Cardiovascular;  Laterality: N/A;  . lower extremities arterial doppler  02/16/2013   bilateral ABIs normal values w/o evidence arterial insuff. at rest; left  lower extrem. norm. left popliteal artery stent norm. patency  . LOWER EXTREMITY ANGIOGRAM Left 08/18/2013   Procedure: LOWER EXTREMITY ANGIOGRAM;  Surgeon: Lorretta Harp, MD;  Location: [facility] CATH LAB;  Service: Cardiovascular;  Laterality: Left;  . LUMBAR DISC SURGERY  1990's   "herniated disc" (08/18/2013)  . PERCUTANEOUS STENT INTERVENTION  01/30/2013   Procedure: PERCUTANEOUS STENT  INTERVENTION;  Surgeon: Lorretta Harp, MD;  Location: [facility] CATH LAB;  Service: Cardiovascular;;  . PERIPHERAL VASCULAR CATHETERIZATION N/A 09/05/2015   Procedure: Lower Extremity Angiography;  Surgeon: Lorretta Harp, MD;  Location: [place] CV LAB;  Service: Cardiovascular;  Laterality: N/A;  . POPLITEAL ARTERY STENT Left 01/30/13  . POPLITEAL ARTERY STENT Left 10/'28/2014   successful Viabahn covered stent placement within the previously placed IDEV  stents/notes 08/18/2013  . POSTERIOR LUMBAR FUSION  1990's  . TONSILLECTOMY    . WRIST FUSION Right 1990's?   "first OR didn't correct problem" (08/18/2013)    Current Medications: Current Meds  Medication Sig  . aspirin EC 325 MG EC tablet Take 1 tablet (325 mg total) by mouth daily.  Marland Kitchen atorvastatin (LIPITOR) 80 MG tablet Take 1 tablet (80 mg total) by mouth daily at 6 PM.  . carvedilol (COREG) 12.5 MG tablet Take 1 tablet (12.5 mg total) by mouth 2 (two) times daily with a meal.  . gabapentin (NEURONTIN) 100 MG capsule Take 1 capsule (100 mg total) by mouth at bedtime.  . ondansetron (ZOFRAN) 4 MG tablet Take 1 tablet (4 mg total) by mouth every 8 (eight) hours as needed for nausea or vomiting.  . sacubitril-valsartan (ENTRESTO) 24-26 MG Take 1 tablet by mouth 2 (two) times daily.  Marland Kitchen spironolactone (ALDACTONE) 25 MG tablet Take 0.5 tablets (12.5 mg total) by mouth daily.  Marland Kitchen XTAMPZA ER 13.5 MG C12A Take 1 capsule by mouth 2 (two) times daily.     Allergies:   Plavix [clopidogrel bisulfate]   Social History   Socioeconomic History  . Marital status: Married    Spouse name: [name]  . Number of children: 4  . Years of education: Not on file  . Highest education level: Not on file  Occupational History  . Not on file  Social Needs  . Financial resource strain: Not on file  . Food insecurity    Worry: Not on file    Inability: Not on file  . Transportation needs    Medical: Not on file    Non-medical: Not on file  Tobacco  Use  . Smoking status: Former Smoker    Packs/day: 1.00    Years: 44.00    Pack years: 44.00    Types: Cigarettes    Quit date: 08/26/1997    Years since quitting: 22.0  . Smokeless tobacco: Former Systems developer    Types: Chew  Substance and Sexual Activity  . Alcohol use: Yes    Comment: 08/18/2013 "glass of wine 5-6 months ago; very rare"  . Drug use: No  . Sexual activity: Never  Lifestyle  . Physical activity    Days per week: Not on file    Minutes per session: Not on file  . Stress: Not on file  Relationships  . Social connections    Talks  on phone: Not on file    Gets together: Not on file    Attends religious service: Not on file    Active member of club or organization: Not on file    Attends meetings of clubs or organizations: Not on file    Relationship status: Not on file  Other Topics Concern  . Not on file  Social History Narrative  . Not on file     Family History: The patient's family history includes Cancer in his father; Coronary artery disease in his brother; Diabetes in his brother; Seizures in his mother.  ROS:   Please see the history of present illness.     All other systems reviewed and are negative.  EKGs/Labs/Other Studies Reviewed:     Recent Labs: 07/22/2019: ALT 299 07/23/2019: Magnesium 2.0 08/13/2019: BUN 24; Creatinine, Ser 1.36; Hemoglobin 10.9; Platelets 308; Potassium 4.8; Sodium 138  Recent Lipid Panel No results found for: CHOL, TRIG, HDL, CHOLHDL, VLDL, LDLCALC, LDLDIRECT  Physical Exam:    VS:  BP 136/64   Pulse 82   Temp (!) 97 F (36.1 C)   Ht 5\' 3"  (1.6 m)   Wt 163 lb 9.6 oz (74.2 kg)   SpO2 94%   BMI 28.98 kg/m     Wt Readings from Last 3 Encounters:  08/27/19 163 lb 9.6 oz (74.2 kg)  08/13/19 164 lb 12.8 oz (74.8 kg)  08/07/19 168 lb (76.2 kg)     GEN:  Well nourished, well developed in no acute distress HEENT: Normal NECK: No JVD; No carotid bruits LYMPHATICS: No lymphadenopathy CARDIAC: RRR, no murmurs, rubs,  gallops RESPIRATORY:  Clear to auscultation without rales, wheezing or rhonchi  ABDOMEN: Soft, non-tender, non-distended MUSCULOSKELETAL:  No edema; No deformity  SKIN: Warm and dry NEUROLOGIC:  Alert and oriented x 3 PSYCHIATRIC:  Normal affect   ASSESSMENT:    Hypotension secondary to medications Improved on decreased Entresto and Coreg  STEMI (ST elevation myocardial infarction) ([place]) Pt admitted with CAP and sepsis 07/16/2019- developed respiratory failure and STEMI 07/19/2019- Ultimately had CABG x 2 07/22/2019  Ischemic cardiomyopathy New LVD -25% by echo 07/19/2019  S/P CABG x 2 LIMA-LAD, SVG-PDA 07/22/2019  PAD (peripheral artery disease) ([facility]) Known occluded Lt popliteal stent 2016- medical rx. Will defer full dose ASA to his vascular surgeon  Dyslipidemia, goal LDL below 70 On statin Rx-check lipids, LFTs 2 months  AKI (acute kidney injury) ([place]) On admission Sept 2020- SCr improved to 1.26 at discharge-check BMP today  PLAN:    Same Rx- check BMP.  Check echo in Jan then f/u with Dr Gwenlyn Found.    Medication Adjustments/Labs and Tests Ordered: Current medicines are reviewed at length with the patient today.  Concerns regarding medicines are outlined above.  No orders of the defined types were placed in this encounter.  No orders of the defined types were placed in this encounter.   There are no Patient Instructions on file for this visit.   Signed, Kerin Ransom, PA-C  08/27/2019 11:30 AM    [place]

## 2019-08-27 NOTE — Patient Instructions (Signed)
Medication Instructions:  Your physician recommends that you continue on your current medications as directed. Please refer to the Current Medication list given to you today. *If you need a refill on your cardiac medications before your next appointment, please call your pharmacy*  Lab Work: Your physician recommends that you return for lab work in: TODAY-BMET If you have labs (blood work) drawn today and your tests are completely normal, you will receive your results only by: Marland Kitchen MyChart Message (if you have MyChart) OR . A paper copy in the mail If you have any lab test that is abnormal or we need to change your treatment, we will call you to review the results.  Testing/Procedures: Your physician has requested that you have an echocardiogram. Echocardiography is a painless test that uses sound waves to create images of your heart. It provides your doctor with information about the size and shape of your heart and how well your heart's chambers and valves are working. This procedure takes approximately one hour. There are no restrictions for this procedure. SCHEDULE ECHO FOR EARLY PART OF January 2021 [address]  Follow-Up: At [facility], you and your health needs are our priority.  As part of our continuing mission to provide you with exceptional heart care, we have created designated Provider Care Teams.  These Care Teams include your primary Cardiologist (physician) and Advanced Practice Providers (APPs -  Physician Assistants and Nurse Practitioners) who all work together to provide you with the care you need, when you need it.  Your next appointment:   2 MONTHS   The format for your next appointment:   In Person  Provider:   Quay Burow, MD ONLY AND AFTER ECHO IN [place]  Other Instructions

## 2019-08-28 LAB — BASIC METABOLIC PANEL
BUN/Creatinine Ratio: 19 (ref 10–24)
BUN: 24 mg/dL (ref 8–27)
CO2: 21 mmol/L (ref 20–29)
Calcium: 9.4 mg/dL (ref 8.6–10.2)
Chloride: 104 mmol/L (ref 96–106)
Creatinine, Ser: 1.28 mg/dL — ABNORMAL HIGH (ref 0.76–1.27)
GFR calc Af Amer: 62 mL/min/{1.73_m2} (ref >59)
GFR calc non Af Amer: 53 mL/min/{1.73_m2} — ABNORMAL LOW (ref >59)
Glucose: 97 mg/dL (ref 65–99)
Potassium: 5.3 mmol/L — ABNORMAL HIGH (ref 3.5–5.2)
Sodium: 140 mmol/L (ref 134–144)

## 2019-09-01 ENCOUNTER — Encounter (HOSPITAL_COMMUNITY): Payer: Self-pay

## 2019-09-02 ENCOUNTER — Other Ambulatory Visit: Payer: Self-pay

## 2019-09-02 DIAGNOSIS — N179 Acute kidney failure, unspecified: Secondary | ICD-10-CM

## 2019-09-07 ENCOUNTER — Telehealth: Payer: Self-pay

## 2019-09-07 ENCOUNTER — Telehealth (HOSPITAL_COMMUNITY): Payer: Self-pay

## 2019-09-07 NOTE — Telephone Encounter (Signed)
Cardiac Rehab Medication Review by a Pharmacist  Does the patient  feel that his/her medications are working for him/her?  yes  Has the patient been experiencing any side effects to the medications prescribed?  no  Does the patient measure his/her own blood pressure or blood glucose at home?  Pt has a blood pressure machine at home but does not check it regularly. Last time BP was checked was when physical therapy saw him and checked his BP, SBP was in the 130s, pt did not remember DBP value.   Does the patient have any problems obtaining medications due to transportation or finances?   yes Pt recently filed for patient assistance with [employer], have not heard back.   Understanding of regimen: excellent Understanding of indications: excellent Potential of compliance: excellent    Pharmacist comments: Pt expressed eagerness to be adherent to medications but also difficulty with affording Entresto (copay >$100 per month). Pt also sees a pain clinic for chronic back pain.    Berenice Bouton, PharmD PGY1 Pharmacy Resident Office phone: [phone] 09/07/2019 10:12 AM

## 2019-09-07 NOTE — Telephone Encounter (Signed)
Received letter from [employer] stating patient has been approval for [employer].  Contact patient and made him aware of approval letter.   APPROVAL LETTER TO BE SCANNED INTO PATIENTS CHART.

## 2019-09-09 ENCOUNTER — Other Ambulatory Visit: Payer: Self-pay

## 2019-09-09 DIAGNOSIS — N179 Acute kidney failure, unspecified: Secondary | ICD-10-CM

## 2019-09-11 ENCOUNTER — Other Ambulatory Visit: Payer: Self-pay

## 2019-09-11 ENCOUNTER — Encounter: Payer: Self-pay | Admitting: Thoracic Surgery (Cardiothoracic Vascular Surgery)

## 2019-09-11 ENCOUNTER — Ambulatory Visit (INDEPENDENT_AMBULATORY_CARE_PROVIDER_SITE_OTHER): Payer: Self-pay | Admitting: Thoracic Surgery (Cardiothoracic Vascular Surgery)

## 2019-09-11 ENCOUNTER — Ambulatory Visit
Admission: RE | Admit: 2019-09-11 | Discharge: 2019-09-11 | Disposition: A | Discharge status: Home / Self Care | Payer: Medicare Other | Source: Ambulatory Visit | Attending: Thoracic Surgery (Cardiothoracic Vascular Surgery) | Admitting: Thoracic Surgery (Cardiothoracic Vascular Surgery)

## 2019-09-11 VITALS — BP 119/73 | HR 80 | Temp 97.8°F | Resp 20 | Ht 63.0 in | Wt 159.0 lb

## 2019-09-11 DIAGNOSIS — Z951 Presence of aortocoronary bypass graft: Secondary | ICD-10-CM

## 2019-09-11 DIAGNOSIS — I2510 Atherosclerotic heart disease of native coronary artery without angina pectoris: Secondary | ICD-10-CM

## 2019-09-11 NOTE — Progress Notes (Signed)
      [address]       [place] [address]             [phone]        Tarence W Majkowski [facility] Medical Record [id number] Date of Birth: August 01, 1941  Referring: Jettie Booze, MD Primary Care: Leonard Downing, MD Primary Cardiologist:Jonathan Gwenlyn Found, MD  Reason for visit:   follow-up  History of Present Illness:     78 year old male presents for his first follow-up following coronary artery bypass grafting.  He was admitted to the inpatient cardiac rehab, but has been doing well.  He has no complaints  Physical Exam: BP 119/73   Pulse 80   Temp 97.8 F (36.6 C) (Skin)   Resp 20   Ht 5\' 3"  (1.6 m)   Wt 159 lb (72.1 kg)   SpO2 90% Comment: RA  BMI 28.17 kg/m   Alert NAD .  Incision clean.  Sternum stable Abdomen soft, NT/ND No peripheral edema      Assessment / Plan:   78 year old male status post CABG doing well. Cleared for cardiac rehab Follow-up as needed   Lajuana Matte 09/11/2019 4:06 PM

## 2019-09-15 ENCOUNTER — Telehealth (HOSPITAL_COMMUNITY): Payer: Self-pay

## 2019-09-15 ENCOUNTER — Ambulatory Visit (HOSPITAL_COMMUNITY): Payer: Medicare Other

## 2019-09-15 NOTE — Telephone Encounter (Signed)
Pt called and stated he was not able to come in today for orientation, he will call back in a few days to reschedule. Will place pt ppw in CR ready to schedule bin, awaiting pt call.

## 2019-09-21 ENCOUNTER — Ambulatory Visit (HOSPITAL_COMMUNITY): Payer: Medicare Other

## 2019-09-23 ENCOUNTER — Ambulatory Visit (HOSPITAL_COMMUNITY): Payer: Medicare Other

## 2019-09-24 ENCOUNTER — Telehealth: Payer: Self-pay

## 2019-09-24 ENCOUNTER — Other Ambulatory Visit: Payer: Self-pay | Admitting: Physician Assistant

## 2019-09-24 NOTE — Telephone Encounter (Signed)
Opened in error

## 2019-09-24 NOTE — Telephone Encounter (Addendum)
Called patient and informed him that his patient assistance for [employer] will be expiring at the end of the year. Advised patient that I would complete our portion of the form and mail it to him. And he can complete his portion and mail it back to the company so there is no therapy disruption. Patient voiced understanding and thanked me for the call.

## 2019-09-25 ENCOUNTER — Ambulatory Visit (HOSPITAL_COMMUNITY): Payer: Medicare Other

## 2019-09-28 ENCOUNTER — Ambulatory Visit (HOSPITAL_COMMUNITY): Payer: Medicare Other

## 2019-09-30 ENCOUNTER — Ambulatory Visit (HOSPITAL_COMMUNITY): Payer: Medicare Other

## 2019-10-01 ENCOUNTER — Other Ambulatory Visit: Payer: Self-pay | Admitting: Physician Assistant

## 2019-10-01 ENCOUNTER — Other Ambulatory Visit: Payer: Self-pay | Admitting: Cardiovascular Disease

## 2019-10-01 NOTE — Telephone Encounter (Signed)
°*  STAT* If patient is at the pharmacy, call can be transferred to refill team.   1. Which medications need to be refilled? (please list name of each medication and dose if known)  spironolactone (ALDACTONE) 25 MG tablet atorvastatin (LIPITOR) 80 MG tablet  2. Which pharmacy/location (including street and city if local pharmacy) is medication to be sent to? [facility] [place].  3. Do they need a 30 day or 90 day supply? 30  Patient is out of his medications  These medications were prescribed by a PA when he was in the hospital. This is the first time he needed to get a refill on any of these medicines. The patient says the pharmacy has tried to contact the office a couple times but has not gotten a response from our office.

## 2019-10-02 ENCOUNTER — Ambulatory Visit (HOSPITAL_COMMUNITY): Payer: Medicare Other

## 2019-10-03 ENCOUNTER — Other Ambulatory Visit: Payer: Self-pay | Admitting: Physician Assistant

## 2019-10-05 ENCOUNTER — Ambulatory Visit (HOSPITAL_COMMUNITY): Payer: Medicare Other

## 2019-10-06 MED ORDER — ATORVASTATIN CALCIUM 80 MG PO TABS: 80.0000 mg | ORAL_TABLET | Freq: Every day | ORAL | 4 refills | Status: DC

## 2019-10-06 MED ORDER — SPIRONOLACTONE 25 MG PO TABS: 12.5000 mg | ORAL_TABLET | Freq: Every day | ORAL | 4 refills | Status: DC

## 2019-10-06 NOTE — Telephone Encounter (Signed)
Called patient's wife to let him know that I refilled the medications that he requested to be refilled.

## 2019-10-07 ENCOUNTER — Ambulatory Visit (HOSPITAL_COMMUNITY): Payer: Medicare Other

## 2019-10-09 ENCOUNTER — Ambulatory Visit (HOSPITAL_COMMUNITY): Payer: Medicare Other

## 2019-10-12 ENCOUNTER — Ambulatory Visit (HOSPITAL_COMMUNITY): Payer: Medicare Other

## 2019-10-13 ENCOUNTER — Encounter (HOSPITAL_COMMUNITY): Payer: Self-pay

## 2019-10-13 ENCOUNTER — Telehealth (HOSPITAL_COMMUNITY): Payer: Self-pay

## 2019-10-13 NOTE — Telephone Encounter (Signed)
Called to advise pt that given the surge/increase in Covid-19 cases and hospitalizations our our [facility] has asked [place] to halt onsite Cardiac and Pulmonary rehab exercise sessions and move patients to the Virtual app we have with [facility]. The reason for this is so that department staff can be deployed to the Inpatient Nursing floors to assist those teams in need. Leadership anticipates this need will be 30-60 days however it could be extended if needed.. Left message for pt to call back and I sent pt a mychart message with this information.

## 2019-10-14 ENCOUNTER — Ambulatory Visit (HOSPITAL_COMMUNITY): Payer: Medicare Other

## 2019-10-19 ENCOUNTER — Ambulatory Visit (HOSPITAL_COMMUNITY): Payer: Medicare Other

## 2019-10-21 ENCOUNTER — Ambulatory Visit (HOSPITAL_COMMUNITY): Payer: Medicare Other

## 2019-10-26 ENCOUNTER — Ambulatory Visit (HOSPITAL_COMMUNITY): Payer: Medicare Other

## 2019-10-27 ENCOUNTER — Ambulatory Visit (HOSPITAL_COMMUNITY): Payer: Medicare Other | Attending: Cardiology

## 2019-10-27 ENCOUNTER — Other Ambulatory Visit: Payer: Self-pay

## 2019-10-27 DIAGNOSIS — N183 Chronic kidney disease, stage 3 unspecified: Secondary | ICD-10-CM

## 2019-10-27 DIAGNOSIS — I255 Ischemic cardiomyopathy: Secondary | ICD-10-CM

## 2019-10-28 ENCOUNTER — Ambulatory Visit (HOSPITAL_COMMUNITY): Payer: Medicare Other

## 2019-10-28 ENCOUNTER — Telehealth (HOSPITAL_COMMUNITY): Payer: Self-pay

## 2019-10-28 NOTE — Telephone Encounter (Signed)
Successful telephone encounter to [name] to confirm Cardiac Rehab orientation appointment for 10/29/19 at 1:30pm. Per patient, he needs to reschedule this appointment as he is not feeling well. Patient states he has had ongoing stomach pain for 2 weeks. Was told by PCP "it was probably a virus". Patient states he was prescribed "medicine" for the cramps. Patient states he is "getting better" but not very fast. Patient instructed to call PCP if symptoms worsen or not improved over the next 48 hours. Patient verbalized understanding.   Plan: will cancel orientation appointment per patient request and call back in two weeks to reschedule.  Kathelene Rumberger E. Laray Anger, BSN

## 2019-10-29 ENCOUNTER — Ambulatory Visit (HOSPITAL_COMMUNITY): Payer: Medicare Other

## 2019-10-30 ENCOUNTER — Other Ambulatory Visit: Payer: Self-pay

## 2019-10-30 ENCOUNTER — Ambulatory Visit: Payer: Medicare Other | Admitting: Cardiovascular Disease

## 2019-10-30 ENCOUNTER — Encounter: Payer: Self-pay | Admitting: Cardiovascular Disease

## 2019-10-30 ENCOUNTER — Ambulatory Visit (HOSPITAL_COMMUNITY): Payer: Medicare Other

## 2019-10-30 VITALS — BP 195/87 | HR 82 | Ht 63.0 in | Wt 162.0 lb

## 2019-10-30 DIAGNOSIS — I2510 Atherosclerotic heart disease of native coronary artery without angina pectoris: Secondary | ICD-10-CM

## 2019-10-30 DIAGNOSIS — E785 Hyperlipidemia, unspecified: Secondary | ICD-10-CM

## 2019-10-30 DIAGNOSIS — I739 Peripheral vascular disease, unspecified: Secondary | ICD-10-CM | POA: Diagnosis not present

## 2019-10-30 DIAGNOSIS — I10 Essential (primary) hypertension: Secondary | ICD-10-CM

## 2019-10-30 DIAGNOSIS — I5021 Acute systolic (congestive) heart failure: Secondary | ICD-10-CM

## 2019-10-30 NOTE — Assessment & Plan Note (Signed)
History of essential hypertension with blood pressure measured today 195/87.  He did not take his antihypertensive medications this morning which include carvedilol, Entresto and spironolactone.

## 2019-10-30 NOTE — Progress Notes (Signed)
10/30/2019 [name]   1941-03-12  [id number]  Primary Physician Leonard Downing, MD Primary Cardiologist: Lorretta Harp MD Lupe Carney, [place]  HPI:  Philip Hurst is a 79 y.o.  Caucasian male who I last saw in the office 09/21/2015.  He has a history of peripheral artery disease and is having symptoms of claudication with a left lower extremity ABI of 0.52 and a right of 0.90. I last saw him in the office 10/27/14.His history also includes hypertension, coronary artery disease, hyperlipidemia. He had a normal cardiac stress test on December 15, 2012 which was nonischemic. He also has dyslipidemia and has remote tobacco abuse but quit 20 years ago. Dr. Gwenlyn Found recently performed a lower extremity angiogram which revealed an occluded popliteal artery in the left lower extremity. The right was normal. He also had widely patent renal arteries. He underwent successful balloon angioplasty and stenting of the occluded segment using a chocolate balloon, overlapping IDEV self-expanding stent. The patient now presents today in followup to that procedure. He appears to be doing quite well. He is feeling a little bit under the weather, however, he feels that the left lower extremity claudication symptoms have resolved. He has not done quite as much walking as he used to, but on one of his walks which normally would have caused him problems he did not have any pain. He also denies nausea, vomiting, fever, dizziness, lightheadedness, chest pain, shortness of breath, edema.   The patient was taking Plavix upon discharge from the hospital but developed an allergy to it and was stopped. His aspirin was then increased to 325 mg daily.he had a followup Doppler study performed 02/16/13 revealing a left ABI of 1 with a widely patent popliteal artery. the patient underwent repeat angiography October 2014 revealing an occluded IDE V stent in the left popliteal artery. He underwent Viabahn restenting with  excellent angiographic, ultrasonographic and clinical result up until present 2 weeks ago when he developed sudden onset of recurrent left calf pain. Subsequent Doppler's showed occlusion of his popliteal artery with an ABI in the 0.3 range. He underwent peripheral angiography by myself 09/05/15 revealing an occluded popliteal artery stent, occluded P3 segment of the popliteal artery on the left with reconstitution of the anterior tibial and peroneal arteries. He is considerably symptomatic with limitation of his activities of daily living. I do not think he has a percutaneous solution for revascularization given the 2 prior stenting procedures. I'm going to begin him on Pletal and for him to Dr. Trula Slade for consideration of fem-tib bypass grafting  Since I saw him back over 4 years ago he was admitted to the hospital 07/16/2019 with pneumonia and sepsis.  He ultimately had positive enzymes and EKG changes which led to cardiac catheterization by Dr. Tamala Julian revealing an occluded dominant RCA with left-to-right collaterals, high-grade proximal LAD disease and obtuse marginal branch disease.  He underwent CABG x2 by Dr. Kipp Brood 07/22/2019 with a LIMA to the LAD and a vein graft to the PDA.  His EF initially was 25% which has since for risen to normal by echo 10/27/2019 after being treated with optimal medical therapy and revascularization therapy.   No outpatient medications have been marked as taking for the 10/30/19 encounter (Office Visit) with Lorretta Harp, MD.     Allergies  Allergen Reactions  . Plavix [Clopidogrel Bisulfate] Itching and Rash    Social History   Socioeconomic History  . Marital status: Married  Spouse name: [name]  . Number of children: 4  . Years of education: Not on file  . Highest education level: Not on file  Occupational History  . Not on file  Tobacco Use  . Smoking status: Former Smoker    Packs/day: 1.00    Years: 44.00    Pack years: 44.00    Types:  Cigarettes    Quit date: 08/26/1997    Years since quitting: 22.1  . Smokeless tobacco: Former Systems developer    Types: Chew  Substance and Sexual Activity  . Alcohol use: Yes    Comment: 08/18/2013 "glass of wine 5-6 months ago; very rare"  . Drug use: No  . Sexual activity: Never  Other Topics Concern  . Not on file  Social History Narrative  . Not on file   Social Determinants of Health   Financial Resource Strain:   . Difficulty of Paying Living Expenses: Not on file  Food Insecurity:   . Worried About Charity fundraiser in the Last Year: Not on file  . Ran Out of Food in the Last Year: Not on file  Transportation Needs:   . Lack of Transportation (Medical): Not on file  . Lack of Transportation (Non-Medical): Not on file  Physical Activity:   . Days of Exercise per Week: Not on file  . Minutes of Exercise per Session: Not on file  Stress:   . Feeling of Stress : Not on file  Social Connections:   . Frequency of Communication with Friends and Family: Not on file  . Frequency of Social Gatherings with Friends and Family: Not on file  . Attends Religious Services: Not on file  . Active Member of Clubs or Organizations: Not on file  . Attends Archivist Meetings: Not on file  . Marital Status: Not on file  Intimate Partner Violence:   . Fear of Current or Ex-Partner: Not on file  . Emotionally Abused: Not on file  . Physically Abused: Not on file  . Sexually Abused: Not on file     Review of Systems: General: negative for chills, fever, night sweats or weight changes.  Cardiovascular: negative for chest pain, dyspnea on exertion, edema, orthopnea, palpitations, paroxysmal nocturnal dyspnea or shortness of breath Dermatological: negative for rash Respiratory: negative for cough or wheezing Urologic: negative for hematuria Abdominal: negative for nausea, vomiting, diarrhea, bright red blood per rectum, melena, or hematemesis Neurologic: negative for visual changes,  syncope, or dizziness All other systems reviewed and are otherwise negative except as noted above.    Blood pressure (!) 195/87, pulse 82, height 5\' 3"  (1.6 m), weight 162 lb (73.5 kg), SpO2 98 %.  General appearance: alert and no distress Neck: no adenopathy, no carotid bruit, no JVD, supple, symmetrical, trachea midline and thyroid not enlarged, symmetric, no tenderness/mass/nodules Lungs: clear to auscultation bilaterally Heart: regular rate and rhythm, S1, S2 normal, no murmur, click, rub or gallop Extremities: extremities normal, atraumatic, no cyanosis or edema Pulses: 2+ and symmetric Skin: Skin color, texture, turgor normal. No rashes or lesions Neurologic: Alert and oriented X 3, normal strength and tone. Normal symmetric reflexes. Normal coordination and gait  EKG not performed today   ASSESSMENT AND PLAN:   Hypertension History of essential hypertension with blood pressure measured today 195/87.  He did not take his antihypertensive medications this morning which include carvedilol, Entresto and spironolactone.  CAD (coronary artery disease), single vessel disease with collaterals, medical therapy History of CAD status post recent  cardiac cath by Dr. Tamala Julian during hospitalization 07/16/2019 through 07/29/2019 revealing a total RCA with left-to-right collaterals and high-grade LAD disease and obtuse marginal branch disease.  He subsequent underwent CABG x2 by Dr. Kipp Brood with a LIMA to the LAD and a vein graft to the PDA and has done well.  He denies chest pain or shortness of breath.  Dyslipidemia, goal LDL below 70 History of dyslipidemia currently on high-dose atorvastatin.  We will recheck a lipid liver profile  Claudication of left lower extremity, ABI on Lt now  .80 or less down from 1.0 in 01/2013 History of peripheral arterial disease status post remote intervention on his left popliteal artery with stenting using IDE V stent.  Ultimately this occluded and he was treated  with a Viabahn stent which occluded as well.  I had sent him to Dr. Trula Slade for consideration of fem-tib bypass grafting however it was decided to treat him medically.  Acute systolic HF (heart failure) (HCC) History of ischemic cardiomyopathy with an EF initially at the time of presentation during his September/October admission of 25%.  He is on optimal medical therapy for this including carvedilol, Entresto and spironolactone.  He did undergo surgical revascularization.  His most recent echo performed 10/27/2019 revealed normalization of EF to 55 to 60%.  He is asymptomatic.      Lorretta Harp MD FACP,FACC,FAHA, [facility] 10/30/2019 10:59 AM

## 2019-10-30 NOTE — Assessment & Plan Note (Signed)
History of dyslipidemia currently on high-dose atorvastatin.  We will recheck a lipid liver profile

## 2019-10-30 NOTE — Assessment & Plan Note (Signed)
History of peripheral arterial disease status post remote intervention on his left popliteal artery with stenting using IDE V stent.  Ultimately this occluded and he was treated with a Viabahn stent which occluded as well.  I had sent him to Dr. Trula Slade for consideration of fem-tib bypass grafting however it was decided to treat him medically.

## 2019-10-30 NOTE — Assessment & Plan Note (Signed)
History of ischemic cardiomyopathy with an EF initially at the time of presentation during his September/October admission of 25%.  He is on optimal medical therapy for this including carvedilol, Entresto and spironolactone.  He did undergo surgical revascularization.  His most recent echo performed 10/27/2019 revealed normalization of EF to 55 to 60%.  He is asymptomatic.

## 2019-10-30 NOTE — Patient Instructions (Signed)
Medication Instructions:  Your physician recommends that you continue on your current medications as directed. Please refer to the Current Medication list given to you today.  If you need a refill on your cardiac medications before your next appointment, please call your pharmacy.   Lab work: Fasting Lipids and Hepatic Function If you have labs (blood work) drawn today and your tests are completely normal, you will receive your results only by: MyChart Message (if you have MyChart) OR A paper copy in the mail If you have any lab test that is abnormal or we need to change your treatment, we will call you to review the results.  Testing/Procedures: NONE  Follow-Up: At [facility], you and your health needs are our priority.  As part of our continuing mission to provide you with exceptional heart care, we have created designated Provider Care Teams.  These Care Teams include your primary Cardiologist (physician) and Advanced Practice Providers (APPs -  Physician Assistants and Nurse Practitioners) who all work together to provide you with the care you need, when you need it. You may see Quay Burow, MD or one of the following Advanced Practice Providers on your designated Care Team:    Kerin Ransom, PA-C  [place], [place]  Coletta Memos, [place]  Your physician wants you to follow-up in: 3 months with Kerin Ransom, PA. Your physician wants you to follow-up in: 6 months with Dr Gwenlyn Found

## 2019-10-30 NOTE — Assessment & Plan Note (Signed)
History of CAD status post recent cardiac cath by Dr. Tamala Julian during hospitalization 07/16/2019 through 07/29/2019 revealing a total RCA with left-to-right collaterals and high-grade LAD disease and obtuse marginal branch disease.  He subsequent underwent CABG x2 by Dr. Kipp Brood with a LIMA to the LAD and a vein graft to the PDA and has done well.  He denies chest pain or shortness of breath.

## 2019-11-02 ENCOUNTER — Ambulatory Visit (HOSPITAL_COMMUNITY): Payer: Medicare Other

## 2019-11-04 ENCOUNTER — Ambulatory Visit (HOSPITAL_COMMUNITY): Payer: Medicare Other

## 2019-11-06 ENCOUNTER — Ambulatory Visit (HOSPITAL_COMMUNITY): Payer: Medicare Other

## 2019-11-09 ENCOUNTER — Ambulatory Visit (HOSPITAL_COMMUNITY): Payer: Medicare Other

## 2019-11-11 ENCOUNTER — Ambulatory Visit (HOSPITAL_COMMUNITY): Payer: Medicare Other

## 2019-11-12 ENCOUNTER — Other Ambulatory Visit: Payer: Self-pay

## 2019-11-12 ENCOUNTER — Ambulatory Visit: Payer: Medicare Other | Admitting: General Practice

## 2019-11-12 ENCOUNTER — Encounter: Payer: Self-pay | Admitting: General Practice

## 2019-11-12 ENCOUNTER — Telehealth: Payer: Self-pay | Admitting: Cardiovascular Disease

## 2019-11-12 VITALS — BP 158/82 | HR 78 | Temp 98.4°F | Ht 63.0 in | Wt 163.0 lb

## 2019-11-12 DIAGNOSIS — I10 Essential (primary) hypertension: Secondary | ICD-10-CM | POA: Diagnosis not present

## 2019-11-12 DIAGNOSIS — I2510 Atherosclerotic heart disease of native coronary artery without angina pectoris: Secondary | ICD-10-CM

## 2019-11-12 DIAGNOSIS — E785 Hyperlipidemia, unspecified: Secondary | ICD-10-CM

## 2019-11-12 DIAGNOSIS — I5021 Acute systolic (congestive) heart failure: Secondary | ICD-10-CM | POA: Diagnosis not present

## 2019-11-12 DIAGNOSIS — I255 Ischemic cardiomyopathy: Secondary | ICD-10-CM

## 2019-11-12 MED ORDER — CARVEDILOL 25 MG PO TABS: 25.0000 mg | ORAL_TABLET | Freq: Two times a day (BID) | ORAL | 3 refills | Status: DC

## 2019-11-12 NOTE — Patient Instructions (Signed)
Medication Instructions:  INCREASE CARVEDILOL 25MG  DAILY If you need a refill on your cardiac medications before your next appointment, please call your pharmacy.  Labwork: FASTING LIPID PANEL 3 DAYS BEFORE YOUR FOLLOW UP APPOINTMENT HERE IN OUR OFFICE AT [facility]    You will need to fast. DO NOT EAT OR DRINK PAST MIDNIGHT.       If you have labs (blood work) drawn today and your tests are completely normal, you will receive your results only by: Marland Kitchen MyChart Message (if you have MyChart) OR . A paper copy in the mail If you have any lab test that is abnormal or we need to change your treatment, we will call you to review the results.  Special Instructions: INCREASE PHYSICAL ACTIVITY AS TOLERATED  PLEASE READ AND FOLLOW SALTY 6 ATTACHED  Reduce your risk of getting COVID-19 With your heart disease it is especially important for people at increased risk of severe illness from COVID-19, and those who live with them, to protect themselves from getting COVID-19. The best way to protect yourself and to help reduce the spread of the virus that causes COVID-19 is to: Marland Kitchen Limit your interactions with other people as much as possible. . Take precautions to prevent getting COVID-19 when you do interact with others. If you start feeling sick and think you may have COVID-19, get in touch with your healthcare provider within 24 hours.  Follow-Up: IN 1 MONTH  In Person Coletta Memos, [place].    At [facility], you and your health needs are our priority.  As part of our continuing mission to provide you with exceptional heart care, we have created designated Provider Care Teams.  These Care Teams include your primary Cardiologist (physician) and Advanced Practice Providers (APPs -  Physician Assistants and Nurse Practitioners) who all work together to provide you with the care you need, when you need it.  Thank you for choosing [facility] at [facility]!!

## 2019-11-12 NOTE — Telephone Encounter (Signed)
Returned the call to the patient. He stated that his blood pressure has been running high for several weeks now.   This morning it was 156/116 and yesterday it was 145/103. He stated that this was after taking his medications.  Currently taking: Carvedilol 12.5 mg bid Entresto 24-26 mg bid Spironolactone 12.5 mg daily.  He was asked to check his blood pressure while on the phone and it was 146/103. He had taken his medication at 7:30 this morning. He stated that he was asymptomatic except for feelings of weakness.   He has been set up for an appointment today at 11:45 with Coletta Memos, NP.

## 2019-11-12 NOTE — Progress Notes (Signed)
Cardiology Clinic Note   Patient Name: Philip Hurst Date of Encounter: 11/12/2019  Primary Care Provider:  Leonard Downing, MD Primary Cardiologist:  Quay Burow, MD  Patient Profile    Philip Hurst 79 year old male presents today for  evaluation of his hypertension. Past Medical History    Past Medical History:  Diagnosis Date   Arthritis    "back, joints in right hand" (08/18/2013)   CAD (coronary artery disease), single vessel disease with collaterals, medical therapy 09/12/2011   Chronic lower back pain    Claudication of left lower extremity, ABI Lt 0.52 -lifestyle limiting  ABI rt 0.90 01/30/2013   Coronary artery disease    OCCLUDED RCA   Hyperlipidemia    Hypertension    Normal cardiac stress test, No ischemia 12/15/12 01/30/2013   PAD (peripheral artery disease) (HCC)    Past Surgical History:  Procedure Laterality Date   BACK SURGERY     CARDIAC CATHETERIZATION  @2002    1 vessel disease w/collaterals,medical therapy   CARDIOVASCULAR STRESS TEST  12/07/2008   EF 58%, NO ISCHEMIA   CATARACT EXTRACTION W/ INTRAOCULAR LENS  IMPLANT, BILATERAL Bilateral    CORONARY ARTERY BYPASS GRAFT  07/22/2019   Procedure: OFF PUMP CORONARY ARTERY BYPASS GRAFTING (CABG) times two using left internal mammary artery and right leg saphenous vein;  Surgeon: Lajuana Matte, MD;  Location: [facility] OR;  Service: Open Heart Surgery;;   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 at rest; left  lower extrem. norm. left popliteal artery stent norm. patency   LOWER EXTREMITY ANGIOGRAM Left 08/18/2013   Procedure: LOWER EXTREMITY  ANGIOGRAM;  Surgeon: Lorretta Harp, MD;  Location: [facility] CATH LAB;  Service: Cardiovascular;  Laterality: Left;   LUMBAR DISC SURGERY  1990's   "herniated disc" (08/18/2013)   PERCUTANEOUS STENT INTERVENTION  01/30/2013   Procedure: PERCUTANEOUS STENT INTERVENTION;  Surgeon: Lorretta Harp, MD;  Location: [facility] CATH LAB;  Service: Cardiovascular;;   PERIPHERAL VASCULAR CATHETERIZATION N/A 09/05/2015   Procedure: Lower Extremity Angiography;  Surgeon: Lorretta Harp, MD;  Location: [facility];  Service: Cardiovascular;  Laterality: N/A;   POPLITEAL ARTERY STENT Left 01/30/13   POPLITEAL ARTERY STENT Left 10/'28/2014   successful Viabahn covered stent placement within the previously placed IDEV  stents/notes 08/18/2013   POSTERIOR LUMBAR FUSION  1990's   TONSILLECTOMY     WRIST FUSION Right 1990's?   "first OR didn't correct problem" (08/18/2013)    Allergies  Allergies  Allergen Reactions   Plavix [Clopidogrel Bisulfate] Itching and Rash    History of Present Illness    Mr. Tortora is a past medical history of hypertension, coronary artery disease (CABG x2 06/2019, LIMA-LAD, SVG-PDA.), peripheral arterial disease acute systolic heart failure, STEMI, ischemic cardiomyopathy, AKI, and hyportension.  He had a normal stress test 12/15/2012 which was nonischemic.  Dr. Gwenlyn Found recently performed lower extremity angiogram revealing an occluded popliteal artery in the left lower extremity.  His right popliteal artery was normal.  He also had widely patent renal arteries at that time.  He had a successful balloon angioplasty and stenting of the occluded segment using a chocolate  balloon.(01/2013)  He was seen in follow-up to this procedure and was doing well.  His claudication had resolved.  He had also begun to walk regularly at that time.  This ultimately occluded and was treated with a Viabahn stent which also occluded.  He was then sent to Dr. Trula Slade for consideration of fitting-to have  bypass however medical treatment was recommended.  He was admitted to the hospital on 07/16/2019 pneumonia and sepsis.  He had positive enzymes at that time and EKG changes.  He underwent cardiac catheterization by Dr. Tamala Julian which showed an occluded dominant RCA with left to right collaterals, high-grade proximal LAD and obtuse marginal branch disease.  He underwent CABG x2 by Dr. Kipp Brood 06/2019 with LIMA to LAD and SVG to PDA.  His EF was 25% at that time and has returned to normal with his echocardiogram 10/27/2019.  He was last seen by Dr. Gwenlyn Found on 10/29/2018 during that time he was hypertensive but had not taken his blood pressure medications.  He denied chest pain, shortness of breath,and   He called nurse triage line this morning 11/12/2019 and stated that his blood pressures been elevated for several weeks now.  140s-150s over 100s-116's.  He stated he has been taking his blood pressure after his medications including carvedilol 12.5 Entresto 24-26, and spironolactone 12.5 daily.  He presents the clinic today and states he has been eating Campbell soup regularly.  He states that he is also noticed some weakness in his legs around the same time as he noticed his blood pressure has been elevated.  I will give him the salty 6 sheet and increase his carvedilol to 25 mg twice daily.  I have educated on sodium reduction.  He denies chest pain, shortness of breath, lower extremity edema, fatigue, palpitations, melena, hematuria, hemoptysis, diaphoresis, weakness, presyncope, syncope, orthopnea, and PND.   Home Medications    Prior to Admission medications   Medication Sig Start Date End Date Taking? Authorizing Provider  aspirin EC 325 MG EC tablet Take 1 tablet (325 mg total) by mouth daily. 07/29/19   Elgie Collard, PA-C  atorvastatin (LIPITOR) 80 MG tablet Take 1 tablet (80 mg total) by mouth daily at 6 PM. 10/06/19   Kilroy, Doreene Burke, PA-C  carvedilol (COREG) 12.5 MG tablet Take 1 tablet (12.5 mg  total) by mouth 2 (two) times daily with a meal. 08/13/19   Kilroy, Doreene Burke, PA-C  gabapentin (NEURONTIN) 100 MG capsule Take 1 capsule (100 mg total) by mouth at bedtime. 07/29/19   Elgie Collard, PA-C  ondansetron (ZOFRAN) 4 MG tablet Take 1 tablet (4 mg total) by mouth every 8 (eight) hours as needed for nausea or vomiting. 07/29/19   Elgie Collard, PA-C  oxyCODONE (OXY IR/ROXICODONE) 5 MG immediate release tablet Take 10 mg by mouth every 4 (four) hours as needed for severe pain or breakthrough pain.    [provider]  sacubitril-valsartan (ENTRESTO) 24-26 MG Take 1 tablet by mouth 2 (two) times daily. 08/13/19   Erlene Quan, PA-C  spironolactone (ALDACTONE) 25 MG tablet Take 0.5 tablets (12.5 mg total) by mouth daily. 10/06/19   Erlene Quan, PA-C  XTAMPZA ER 13.5 MG C12A Take 1 capsule by mouth 2 (two) times daily. 08/25/19   [provider]    Family History    Family History  Problem Relation Age of Onset   Seizures Mother    Cancer Father    Coronary artery disease Brother  Diabetes Brother    He indicated that his mother is deceased. He indicated that his father is deceased. He indicated that the status of his brother is unknown.  Social History    Social History   Socioeconomic History   Marital status: Married    Spouse name: [name]   Number of children: 4   Years of education: Not on file   Highest education level: Not on file  Occupational History   Not on file  Tobacco Use   Smoking status: Former Smoker    Packs/day: 1.00    Years: 44.00    Pack years: 44.00    Types: Cigarettes    Quit date: 08/26/1997    Years since quitting: 22.2   Smokeless tobacco: Former Systems developer    Types: Chew  Substance and Sexual Activity   Alcohol use: Yes    Comment: 08/18/2013 "glass of wine 5-6 months ago; very rare"   Drug use: No   Sexual activity: Never  Other Topics Concern   Not on file  Social History Narrative   Not on file    Social Determinants of Health   Financial Resource Strain:    Difficulty of Paying Living Expenses: Not on file  Food Insecurity:    Worried About Charity fundraiser in the Last Year: Not on file   [employer] of Food in the Last Year: Not on file  Transportation Needs:    Lack of Transportation (Medical): Not on file   Lack of Transportation (Non-Medical): Not on file  Physical Activity:    Days of Exercise per Week: Not on file   Minutes of Exercise per Session: Not on file  Stress:    Feeling of Stress : Not on file  Social Connections:    Frequency of Communication with Friends and Family: Not on file   Frequency of Social Gatherings with Friends and Family: Not on file   Attends Religious Services: Not on file   Active Member of Clubs or Organizations: Not on file   Attends Archivist Meetings: Not on file   Marital Status: Not on file  Intimate Partner Violence:    Fear of Current or Ex-Partner: Not on file   Emotionally Abused: Not on file   Physically Abused: Not on file   Sexually Abused: Not on file     Review of Systems    General:  No chills, fever, night sweats or weight changes.  Cardiovascular:  No chest pain, dyspnea on exertion, edema, orthopnea, palpitations, paroxysmal nocturnal dyspnea. Dermatological: No rash, lesions/masses Respiratory: No cough, dyspnea Urologic: No hematuria, dysuria Abdominal:   No nausea, vomiting, diarrhea, bright red blood per rectum, melena, or hematemesis Neurologic:  No visual changes, wkns, changes in mental status. All other systems reviewed and are otherwise negative except as noted above.  Physical Exam    VS:  BP (!) 158/82 (BP Location: Left Arm, Patient Position: Sitting, Cuff Size: Normal)    Pulse 78    Temp 98.4 F (36.9 C)    Ht 5\' 3"  (1.6 m)    Wt 163 lb (73.9 kg)    SpO2 98%    BMI 28.87 kg/m  , BMI Body mass index is 28.87 kg/m. GEN: Well nourished, well developed, in no acute  distress. HEENT: normal. Neck: Supple, no JVD, carotid bruits, or masses. Cardiac: RRR, no murmurs, rubs, or gallops. No clubbing, cyanosis, edema.  Radials/DP/PT 2+ and equal bilaterally.  Respiratory:  Respirations regular and unlabored, clear  to auscultation bilaterally. GI: Soft, nontender, nondistended, BS + x 4. MS: no deformity or atrophy. Skin: warm and dry, no rash. Neuro:  Strength and sensation are intact. Psych: Normal affect.  Accessory Clinical Findings    ECG personally reviewed by me today-none today  EKG 08/13/2019 Sinus rhythm with first-degree AV block T wave abnormality consider anterior lateral ischemia.  67 bpm  Echocardiogram 10/27/2019 IMPRESSIONS    1. Left ventricular ejection fraction, by visual estimation, is 55 to 60%. The left ventricle has normal function. There is moderately increased left ventricular hypertrophy.  2. Left ventricular diastolic parameters are consistent with Grade I diastolic dysfunction (impaired relaxation).  3. The left ventricle has no regional wall motion abnormalities.  4. Global right ventricle has normal systolic function.The right ventricular size is normal. No increase in right ventricular wall thickness.  5. Left atrial size was normal.  6. Right atrial size was normal.  7. The mitral valve is normal in structure. Mild mitral valve regurgitation. No evidence of mitral stenosis.  8. The tricuspid valve is normal in structure.  9. The aortic valve is normal in structure. Aortic valve regurgitation is not visualized. Mild to moderate aortic valve sclerosis/calcification without any evidence of aortic stenosis. 10. The pulmonic valve was normal in structure. Pulmonic valve regurgitation is trivial. 11. The inferior vena cava is normal in size with greater than 50% respiratory variability, suggesting right atrial pressure of 3 mmHg.  Assessment & Plan   1.  Essential hypertension-BP today 158/82. Has been in the [id number] systolic  range at home.  Increase carvedilol to 25 mg twice daily Continue Entresto 24-26 mg Continue spironolactone 12.5 milligrams daily Heart healthy low-sodium diet-salty 6 given Increase physical activity as tolerated Blood pressure log  Coronary artery disease-no chest pain today. Continue aspirin 325 daily Carvedilol 25 mg twice daily Continue atorvastatin 80 mg daily Heart healthy low-sodium diet Increase physical activity as tolerated  Acute systolic heart failure/ischemic cardiomyopathy-CABG x2 06/2019, LIMA-LAD, SVG-PDA.  Echocardiogram 10/27/2019 showed EF 55 to 60%.  Euvolemic today Continue Entresto 25-26 Continue carvedilol Continue spironolactone  Hyperlipidemia-LDL goal below 70 Atorvastatin 80 mg daily Heart healthy low-sodium high-fiber diet Increase physical activity as tolerated Order lipid panel  In one month fasting  Disposition: Follow-up with me in 1 month.  Jossie Ng. [facility] [address] Office [phone] Fax [phone]

## 2019-11-12 NOTE — Telephone Encounter (Signed)
Pt c/o BP issue: STAT if pt c/o blurred vision, one-sided weakness or slurred speech  1. What are your last 5 BP readings?  Pt did not write down readings. They are saved in his electronic BP monitor. He will go look them up  2. Are you having any other symptoms (ex. Dizziness, headache, blurred vision, passed out)? no  3. What is your BP issue? Pt had open heart surgery in October and has been experiencing some higher than normal blood pressure readings. He thinks he needs to see someone to talk about it

## 2019-11-13 ENCOUNTER — Ambulatory Visit (HOSPITAL_COMMUNITY): Payer: Medicare Other

## 2019-11-16 ENCOUNTER — Ambulatory Visit (HOSPITAL_COMMUNITY): Payer: Medicare Other

## 2019-11-18 ENCOUNTER — Ambulatory Visit (HOSPITAL_COMMUNITY): Payer: Medicare Other

## 2019-11-20 ENCOUNTER — Ambulatory Visit (HOSPITAL_COMMUNITY): Payer: Medicare Other

## 2019-11-23 ENCOUNTER — Ambulatory Visit (HOSPITAL_COMMUNITY): Payer: Medicare Other

## 2019-11-25 ENCOUNTER — Ambulatory Visit (HOSPITAL_COMMUNITY): Payer: Medicare Other

## 2019-11-27 ENCOUNTER — Ambulatory Visit (HOSPITAL_COMMUNITY): Payer: Medicare Other

## 2019-11-30 ENCOUNTER — Ambulatory Visit (HOSPITAL_COMMUNITY): Payer: Medicare Other

## 2019-12-02 ENCOUNTER — Ambulatory Visit (HOSPITAL_COMMUNITY): Payer: Medicare Other

## 2019-12-04 ENCOUNTER — Ambulatory Visit (HOSPITAL_COMMUNITY): Payer: Medicare Other

## 2019-12-04 ENCOUNTER — Other Ambulatory Visit: Payer: Self-pay | Admitting: Cardiovascular Disease

## 2019-12-07 ENCOUNTER — Ambulatory Visit (HOSPITAL_COMMUNITY): Payer: Medicare Other

## 2019-12-09 ENCOUNTER — Ambulatory Visit (HOSPITAL_COMMUNITY): Payer: Medicare Other

## 2019-12-11 ENCOUNTER — Ambulatory Visit (HOSPITAL_COMMUNITY): Payer: Medicare Other

## 2019-12-11 LAB — LIPID PANEL
Chol/HDL Ratio: 2.3 ratio (ref 0.0–5.0)
Cholesterol, Total: 100 mg/dL (ref 100–199)
HDL: 44 mg/dL (ref >39)
LDL Chol Calc (NIH): 38 mg/dL (ref 0–99)
Triglycerides: 96 mg/dL (ref 0–149)
VLDL Cholesterol Cal: 18 mg/dL (ref 5–40)

## 2019-12-11 LAB — HEPATIC FUNCTION PANEL
ALT: 14 IU/L (ref 0–44)
AST: 19 IU/L (ref 0–40)
Albumin: 4.5 g/dL (ref 3.7–4.7)
Alkaline Phosphatase: 93 IU/L (ref 39–117)
Bilirubin Total: <0.2 mg/dL (ref 0.0–1.2)
Bilirubin, Direct: 0.07 mg/dL (ref 0.00–0.40)
Total Protein: 6.7 g/dL (ref 6.0–8.5)

## 2019-12-14 ENCOUNTER — Ambulatory Visit (HOSPITAL_COMMUNITY): Payer: Medicare Other

## 2019-12-15 ENCOUNTER — Other Ambulatory Visit: Payer: Self-pay

## 2019-12-15 ENCOUNTER — Encounter: Payer: Self-pay | Admitting: Cardiovascular Disease

## 2019-12-15 ENCOUNTER — Ambulatory Visit: Payer: Medicare Other | Admitting: Cardiovascular Disease

## 2019-12-15 DIAGNOSIS — I739 Peripheral vascular disease, unspecified: Secondary | ICD-10-CM

## 2019-12-15 DIAGNOSIS — I10 Essential (primary) hypertension: Secondary | ICD-10-CM | POA: Diagnosis not present

## 2019-12-15 DIAGNOSIS — I2510 Atherosclerotic heart disease of native coronary artery without angina pectoris: Secondary | ICD-10-CM

## 2019-12-15 DIAGNOSIS — I255 Ischemic cardiomyopathy: Secondary | ICD-10-CM

## 2019-12-15 NOTE — Assessment & Plan Note (Signed)
History of PAD status post remote left popliteal intervention by myself multiple times initially using IDE V stent and subsequent to that using a Viabahn covered stent after which she was demonstrated to be totally occluded 09/05/2015.  I did refer him to Dr. Trula Slade for consideration of bypass however he elected to be treated medically.  He does have mild lifestyle limiting claudication but this has not significantly hampered his ability to do his activities of daily living.

## 2019-12-15 NOTE — Assessment & Plan Note (Signed)
History of ischemic cardiomyopathy with an EF in the 25% range of the time of CABG.  He was placed on optimal medical therapy and his most recent echo performed 10/27/2019 revealed normalization of his ejection fraction.  He is on carvedilol, Entresto and spironolactone.

## 2019-12-15 NOTE — Patient Instructions (Signed)
Medication Instructions:  Your physician recommends that you continue on your current medications as directed. Please refer to the Current Medication list given to you today.  If you need a refill on your cardiac medications before your next appointment, please call your pharmacy.   Lab work: NONE  Testing/Procedures: NONE  Follow-Up: At [facility], you and your health needs are our priority.  As part of our continuing mission to provide you with exceptional heart care, we have created designated Provider Care Teams.  These Care Teams include your primary Cardiologist (physician) and Advanced Practice Providers (APPs -  Physician Assistants and Nurse Practitioners) who all work together to provide you with the care you need, when you need it. You may see Quay Burow, MD or one of the following Advanced Practice Providers on your designated Care Team:    Kerin Ransom, PA-C  [place], [place]  Coletta Memos, [place]  Your physician wants you to follow-up in: 6 months with Kerin Ransom, PA-C and 1 year with Dr. Gwenlyn Found. You will receive a reminder letter in the mail two months in advance. If you don't receive a letter, please call our office to schedule the follow-up appointment.

## 2019-12-15 NOTE — Assessment & Plan Note (Signed)
History of essential hypertension with blood pressure measured today at 110/72.  He is on carvedilol and Entresto.

## 2019-12-15 NOTE — Progress Notes (Signed)
12/15/2019 [name]   1941-09-28  [id number]  Primary Physician Leonard Downing, MD Primary Cardiologist: Lorretta Harp MD Lupe Carney, [place]  HPI:  JAKHAI HEPLER is a 79 y.o.  Caucasian male who I last saw in the office 10/30/2019.  He has a history of peripheral artery disease and is having symptoms of claudication with a left lower extremity ABI of 0.52 and a right of 0.90. I last saw him in the office 10/27/14.His history also includes hypertension, coronary artery disease, hyperlipidemia. He had a normal cardiac stress test on December 15, 2012 which was nonischemic. He also has dyslipidemia and has remote tobacco abuse but quit 20 years ago. Dr. Gwenlyn Found recently performed a lower extremity angiogram which revealed an occluded popliteal artery in the left lower extremity. The right was normal. He also had widely patent renal arteries. He underwent successful balloon angioplasty and stenting of the occluded segment using a chocolate balloon, overlapping IDEV self-expanding stent. The patient now presents today in followup to that procedure. He appears to be doing quite well. He is feeling a little bit under the weather, however, he feels that the left lower extremity claudication symptoms have resolved. He has not done quite as much walking as he used to, but on one of his walks which normally would have caused him problems he did not have any pain. He also denies nausea, vomiting, fever, dizziness, lightheadedness, chest pain, shortness of breath, edema.   The patient was taking Plavix upon discharge from the hospital but developed an allergy to it and was stopped. His aspirin was then increased to 325 mg daily.he had a followup Doppler study performed 02/16/13 revealing a left ABI of 1 with a widely patent popliteal artery. the patient underwent repeat angiography October 2014 revealing an occluded IDE V stent in the left popliteal artery. He underwent Viabahn restenting with excellent  angiographic, ultrasonographic and clinical result up until present 2 weeks ago when he developed sudden onset of recurrent left calf pain. Subsequent Doppler's showed occlusion of his popliteal artery with an ABI in the 0.3 range. He underwent peripheral angiography by myself 09/05/15 revealing an occluded popliteal artery stent, occluded P3 segment of the popliteal artery on the left with reconstitution of the anterior tibial and peroneal arteries. He is considerably symptomatic with limitation of his activities of daily living. I do not think he has a percutaneous solution for revascularization given the 2 prior stenting procedures. I'm going to begin him on Pletal and for him to Dr. Trula Slade for consideration of fem-tib bypass grafting  He was admitted to the hospital 07/16/2019 with pneumonia and sepsis.  He ultimately had positive enzymes and EKG changes which led to cardiac catheterization by Dr. Tamala Julian revealing an occluded dominant RCA with left-to-right collaterals, high-grade proximal LAD disease and obtuse marginal branch disease.  He underwent CABG x2 by Dr. Kipp Brood 07/22/2019 with a LIMA to the LAD and a vein graft to the PDA.  His EF initially was 25% which has since for risen to normal by echo 10/27/2019 after being treated with optimal medical therapy and revascularization therapy.  Since I saw him close to 2 months ago he did have a repeat 2D echocardiogram performed 10/27/2019 which showed normalization in his EF.  He denies chest pain or shortness of breath.  Does have mild left calf claudication with exercise but we decided to treat him medically at this time.   Current Meds  Medication Sig  .  aspirin EC 325 MG EC tablet Take 1 tablet (325 mg total) by mouth daily.  Marland Kitchen atorvastatin (LIPITOR) 80 MG tablet Take 1 tablet (80 mg total) by mouth daily at 6 PM.  . carvedilol (COREG) 25 MG tablet Take 1 tablet (25 mg total) by mouth 2 (two) times daily with a meal.  . sacubitril-valsartan  (ENTRESTO) 24-26 MG Take 1 tablet by mouth 2 (two) times daily.  Marland Kitchen spironolactone (ALDACTONE) 25 MG tablet Take 0.5 tablets (12.5 mg total) by mouth daily.  . [DISCONTINUED] gabapentin (NEURONTIN) 100 MG capsule Take 1 capsule (100 mg total) by mouth at bedtime.     Allergies  Allergen Reactions  . Plavix [Clopidogrel Bisulfate] Itching and Rash    Social History   Socioeconomic History  . Marital status: Married    Spouse name: [name]  . Number of children: 4  . Years of education: Not on file  . Highest education level: Not on file  Occupational History  . Not on file  Tobacco Use  . Smoking status: Former Smoker    Packs/day: 1.00    Years: 44.00    Pack years: 44.00    Types: Cigarettes    Quit date: 08/26/1997    Years since quitting: 22.3  . Smokeless tobacco: Former Systems developer    Types: Chew  Substance and Sexual Activity  . Alcohol use: Yes    Comment: 08/18/2013 "glass of wine 5-6 months ago; very rare"  . Drug use: No  . Sexual activity: Never  Other Topics Concern  . Not on file  Social History Narrative  . Not on file   Social Determinants of Health   Financial Resource Strain:   . Difficulty of Paying Living Expenses: Not on file  Food Insecurity:   . Worried About Charity fundraiser in the Last Year: Not on file  . Ran Out of Food in the Last Year: Not on file  Transportation Needs:   . Lack of Transportation (Medical): Not on file  . Lack of Transportation (Non-Medical): Not on file  Physical Activity:   . Days of Exercise per Week: Not on file  . Minutes of Exercise per Session: Not on file  Stress:   . Feeling of Stress : Not on file  Social Connections:   . Frequency of Communication with Friends and Family: Not on file  . Frequency of Social Gatherings with Friends and Family: Not on file  . Attends Religious Services: Not on file  . Active Member of Clubs or Organizations: Not on file  . Attends Archivist Meetings: Not on file  .  Marital Status: Not on file  Intimate Partner Violence:   . Fear of Current or Ex-Partner: Not on file  . Emotionally Abused: Not on file  . Physically Abused: Not on file  . Sexually Abused: Not on file     Review of Systems: General: negative for chills, fever, night sweats or weight changes.  Cardiovascular: negative for chest pain, dyspnea on exertion, edema, orthopnea, palpitations, paroxysmal nocturnal dyspnea or shortness of breath Dermatological: negative for rash Respiratory: negative for cough or wheezing Urologic: negative for hematuria Abdominal: negative for nausea, vomiting, diarrhea, bright red blood per rectum, melena, or hematemesis Neurologic: negative for visual changes, syncope, or dizziness All other systems reviewed and are otherwise negative except as noted above.    Blood pressure 110/72, pulse 68, temperature 98.1 F (36.7 C), height 5\' 3"  (1.6 m), weight 176 lb (79.8 kg).  General  appearance: alert and no distress Neck: no adenopathy, no carotid bruit, no JVD, supple, symmetrical, trachea midline and thyroid not enlarged, symmetric, no tenderness/mass/nodules Lungs: clear to auscultation bilaterally Heart: regular rate and rhythm, S1, S2 normal, no murmur, click, rub or gallop Extremities: extremities normal, atraumatic, no cyanosis or edema Pulses: Diminished pedal pulses Skin: Skin color, texture, turgor normal. No rashes or lesions Neurologic: Alert and oriented X 3, normal strength and tone. Normal symmetric reflexes. Normal coordination and gait  EKG not performed today  ASSESSMENT AND PLAN:   Hypertension History of essential hypertension with blood pressure measured today at 110/72.  He is on carvedilol and Entresto.  CAD (coronary artery disease), single vessel disease with collaterals, medical therapy History of CAD status post cardiac catheterization performed by Dr. Tamala Julian revealing an occluded dominant RCA with left-to-right collaterals,  high-grade proximal LAD disease as well as obtuse marginal branch disease.  He underwent CABG x2 by Dr. Kipp Brood 07/22/2019 with a LIMA to the LAD, vein to the PDA.  He has done well since, denying chest pain or shortness of breath.  Claudication of left lower extremity, ABI on Lt now  .80 or less down from 1.0 in 01/2013 History of PAD status post remote left popliteal intervention by myself multiple times initially using IDE V stent and subsequent to that using a Viabahn covered stent after which she was demonstrated to be totally occluded 09/05/2015.  I did refer him to Dr. Trula Slade for consideration of bypass however he elected to be treated medically.  He does have mild lifestyle limiting claudication but this has not significantly hampered his ability to do his activities of daily living.  Ischemic cardiomyopathy History of ischemic cardiomyopathy with an EF in the 25% range of the time of CABG.  He was placed on optimal medical therapy and his most recent echo performed 10/27/2019 revealed normalization of his ejection fraction.  He is on carvedilol, Entresto and spironolactone.      Lorretta Harp MD FACP,FACC,FAHA, [facility] 12/15/2019 9:03 AM

## 2019-12-15 NOTE — Assessment & Plan Note (Signed)
History of CAD status post cardiac catheterization performed by Dr. Tamala Julian revealing an occluded dominant RCA with left-to-right collaterals, high-grade proximal LAD disease as well as obtuse marginal branch disease.  He underwent CABG x2 by Dr. Kipp Brood 07/22/2019 with a LIMA to the LAD, vein to the PDA.  He has done well since, denying chest pain or shortness of breath.

## 2019-12-16 ENCOUNTER — Ambulatory Visit (HOSPITAL_COMMUNITY): Payer: Medicare Other

## 2019-12-18 ENCOUNTER — Ambulatory Visit (HOSPITAL_COMMUNITY): Payer: Medicare Other

## 2019-12-21 ENCOUNTER — Ambulatory Visit (HOSPITAL_COMMUNITY): Payer: Medicare Other

## 2019-12-23 ENCOUNTER — Ambulatory Visit (HOSPITAL_COMMUNITY): Payer: Medicare Other

## 2019-12-25 ENCOUNTER — Ambulatory Visit (HOSPITAL_COMMUNITY): Payer: Medicare Other

## 2020-01-11 ENCOUNTER — Telehealth: Payer: Self-pay | Admitting: Cardiovascular Disease

## 2020-01-11 NOTE — Telephone Encounter (Signed)
OK to stop Aldactone if K elevated

## 2020-01-11 NOTE — Telephone Encounter (Signed)
Pt c/o medication issue:  1. Name of Medication: spironolactone (ALDACTONE) 25 MG tablet  2. How are you currently taking this medication (dosage and times per day)? Half tablet once a day  3. Are you having a reaction (difficulty breathing--STAT)? no  4. What is your medication issue? Patient states he was feeling very weak and fatigued so he went to his PCP, Dr. Redmond Pulling. He states Dr. Redmond Pulling told him his potassium is high and to stop taking the medication. He would like to know if it would be okay for him to stop the medication. Please advise

## 2020-01-11 NOTE — Telephone Encounter (Signed)
Returned call to patient he stated he has been feeling weak,no energy.Stated he saw PCP Dr.Wilson.He had a lot of labwork done.Stated his potassium was high.He was told to stop Aldactone.He is scheduled to see Dr.Wilson again this Wednesday to have repeat potassium.He wanted to let Dr.Berry know.Advised I will send message to Dr.Berry.

## 2020-01-11 NOTE — Telephone Encounter (Signed)
Spoke to patient Dr.Berry's advice given. 

## 2020-02-05 ENCOUNTER — Telehealth: Payer: Self-pay | Admitting: Cardiovascular Disease

## 2020-02-05 NOTE — Telephone Encounter (Signed)
The patient called and said he has some forms from [employer] in regards to his patient assistance application that Dr. Gwenlyn Found needs to fill out. He was not sure what he needed to do with them. Please assist the patient

## 2020-02-05 NOTE — Telephone Encounter (Signed)
Called patient, advised if he had filled out his portion he could bring the papers to the office- leave them here and we would fill out our portion and fax them off.   I will send this to the nurse helping to cover for Dr.Berry just so she is aware.

## 2020-02-12 ENCOUNTER — Telehealth: Payer: Self-pay | Admitting: *Deleted

## 2020-02-12 NOTE — Telephone Encounter (Signed)
Patient Assistance completed and faxed to [employer]

## 2020-03-07 ENCOUNTER — Telehealth: Payer: Self-pay | Admitting: Cardiovascular Disease

## 2020-03-07 NOTE — Telephone Encounter (Signed)
See previous 5/17 telephone note.

## 2020-03-07 NOTE — Telephone Encounter (Signed)
Patient states that [employer] contacted him because on the patient assistance application, whoever filled it out on Dr. Kennon Holter end forgot to put his name and DOB. He wants to know how to go about correcting this.

## 2020-03-07 NOTE — Telephone Encounter (Signed)
Spoke to patient he stated Dr.Berry needs to fill out another patient assistance form for Entresto.Stated his name and DOB was left off.Advised I will send message to RN that has been covering him.

## 2020-03-07 NOTE — Telephone Encounter (Signed)
Returned call to patient was told patient outside.[place].

## 2020-03-07 NOTE — Telephone Encounter (Signed)
Patient left voicemail stating patient assistance form sent to [employer] was missing his name and/or date of birth. Please update forms and resubmit.

## 2020-03-08 NOTE — Telephone Encounter (Signed)
Spoke with pt, aware paperwork will be signed by dr berry today and faxed to the company.

## 2020-03-12 ENCOUNTER — Other Ambulatory Visit: Payer: Self-pay | Admitting: Cardiology

## 2020-03-14 NOTE — Telephone Encounter (Signed)
*  STAT* If patient is at the pharmacy, call can be transferred to refill team.   1. Which medications need to be refilled? (please list name of each medication and dose if known)  Atorvastatin- need them asap  2. Which pharmacy/location (including street and city if local pharmacy) is medication to be sent to? [phone]  3. Do they need a 30 day or 90 day supply? 30 days and refills

## 2020-04-22 NOTE — Telephone Encounter (Signed)
Patient assistance has been approved

## 2020-04-28 ENCOUNTER — Telehealth: Payer: Self-pay | Admitting: Cardiovascular Disease

## 2020-04-28 MED ORDER — ENTRESTO 24-26 MG PO TABS: 1.0000 | ORAL_TABLET | Freq: Two times a day (BID) | ORAL | 3 refills | Status: DC

## 2020-04-28 NOTE — Telephone Encounter (Signed)
Patient calling the office for samples of medication:   1.  What medication and dosage are you requesting samples for?sacubitril-valsartan (ENTRESTO) 24-26 MG  2.  Are you currently out of this medication? Is out of medication

## 2020-04-28 NOTE — Telephone Encounter (Signed)
Spoke with pt, no samples available at this time. Script sent to the local pharmacy until his mail order arrives.

## 2020-05-02 ENCOUNTER — Other Ambulatory Visit: Payer: Self-pay | Admitting: Physician Assistant

## 2020-06-12 NOTE — Progress Notes (Signed)
Cardiology Clinic Note   Patient Name: Philip Hurst Date of Encounter: 06/13/2020  Primary Care Provider:  Leonard Downing, MD Primary Cardiologist:  Quay Burow, MD  Patient Profile    Philip Hurst 79 year old male presents today for   follow-up evaluation of his hypertension.  Past Medical History    Past Medical History:  Diagnosis Date  . Arthritis    "back, joints in right hand" (08/18/2013)  . CAD (coronary artery disease), single vessel disease with collaterals, medical therapy 09/12/2011  . Chronic lower back pain   . Claudication of left lower extremity, ABI Lt 0.52 -lifestyle limiting  ABI rt 0.90 01/30/2013  . Coronary artery disease    OCCLUDED RCA  . Hyperlipidemia   . Hypertension   . Normal cardiac stress test, No ischemia 12/15/12 01/30/2013  . PAD (peripheral artery disease) ([place])    Past Surgical History:  Procedure Laterality Date  . BACK SURGERY    . CARDIAC CATHETERIZATION  @2002    1 vessel disease w/collaterals,medical therapy  . CARDIOVASCULAR STRESS TEST  12/07/2008   EF 58%, NO ISCHEMIA  . CATARACT EXTRACTION W/ INTRAOCULAR LENS  IMPLANT, BILATERAL Bilateral   . CORONARY ARTERY BYPASS GRAFT  07/22/2019   Procedure: OFF PUMP CORONARY ARTERY BYPASS GRAFTING (CABG) times two using left internal mammary artery and right leg saphenous vein;  Surgeon: Lajuana Matte, MD;  Location: [facility];  Service: Open Heart Surgery;;  . 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 LEFT HEART CATH AND CORONARY ANGIOGRAPHY N/A 07/19/2019   Procedure: LEFT HEART CATH AND CORONARY ANGIOGRAPHY;  Surgeon: Belva Crome, MD;  Location: [facility];  Service: Cardiovascular;  Laterality: N/A;  . lower extremities arterial doppler  02/16/2013   bilateral ABIs normal values w/o evidence arterial insuff. at rest; left  lower extrem. norm. left popliteal artery stent norm. patency  . LOWER EXTREMITY ANGIOGRAM Left 08/18/2013   Procedure: LOWER  EXTREMITY ANGIOGRAM;  Surgeon: Lorretta Harp, MD;  Location: [facility] CATH LAB;  Service: Cardiovascular;  Laterality: Left;  . LUMBAR DISC SURGERY  1990's   "herniated disc" (08/18/2013)  . PERCUTANEOUS STENT INTERVENTION  01/30/2013   Procedure: PERCUTANEOUS STENT INTERVENTION;  Surgeon: Lorretta Harp, MD;  Location: [facility] CATH LAB;  Service: Cardiovascular;;  . PERIPHERAL VASCULAR CATHETERIZATION N/A 09/05/2015   Procedure: Lower Extremity Angiography;  Surgeon: Lorretta Harp, MD;  Location: [place] [facility];  Service: Cardiovascular;  Laterality: N/A;  . POPLITEAL ARTERY STENT Left 01/30/13  . POPLITEAL ARTERY STENT Left 10/'28/2014   successful Viabahn covered stent placement within the previously placed IDEV  stents/notes 08/18/2013  . POSTERIOR LUMBAR FUSION  1990's  . TONSILLECTOMY    . WRIST FUSION Right 1990's?   "first OR didn't correct problem" (08/18/2013)    Allergies  Allergies  Allergen Reactions  . Plavix [Clopidogrel Bisulfate] Itching and Rash    History of Present Illness    Philip Hurst is a past medical history of hypertension, coronary artery disease (CABG x2 06/2019, LIMA-LAD, SVG-PDA.), peripheral arterial disease acute systolic heart failure, STEMI, ischemic cardiomyopathy, AKI, and hyportension.  He had a normal stress test 12/15/2012 which was nonischemic.  Dr. Gwenlyn Found recently performed lower extremity angiogram revealing an occluded popliteal artery in the left lower extremity.  His right popliteal artery was normal.  He also had widely patent renal arteries at that time.  He had a successful balloon angioplasty and stenting of the occluded segment  using a chocolate balloon.(01/2013)  He was seen in follow-up to this procedure and was doing well.  His claudication had resolved.  He had also begun to walk regularly at that time.  This ultimately occluded and was treated with a Viabahn stent which also occluded.  He was then sent to Dr. Trula Slade for consideration of  fitting-to have bypass however medical treatment was recommended.  He was admitted to the hospital on 07/16/2019 pneumonia and sepsis.  He had positive enzymes at that time and EKG changes.  He underwent cardiac catheterization by Dr. Tamala Julian which showed an occluded dominant RCA with left to right collaterals, high-grade proximal LAD and obtuse marginal branch disease.  He underwent CABG x2 by Dr. Kipp Brood 06/2019 with LIMA to LAD and SVG to PDA.  His EF was 25% at that time and has returned to normal with his echocardiogram 10/27/2019.  He was last seen by Dr. Gwenlyn Found on 10/29/2018 during that time he was hypertensive but had not taken his blood pressure medications.  He denied chest pain, shortness of breath,and   He called nurse triage line this morning 11/12/2019 and stated that his blood pressures been elevated for several weeks now.  140s-150s over 100s-116's.  He stated he has been taking his blood pressure after his medications including carvedilol 12.5 Entresto 24-26, and spironolactone 12.5 daily.  He presented the clinic 09/11/2020 and stated he had been eating Campbell soup regularly.  He stated that he was also noticing some weakness in his legs around the same time as he noticed his blood pressure had been elevated.  I  gave him the salty 6 sheet and increased his carvedilol to 25 mg twice daily.  I  educated on sodium reduction.  He followed up with Dr. Gwenlyn Found on 12/15/2019 and denied chest pain or shortness of breath.  He had mild left calf claudication with exercise.  It was decided that he would be medically managed.  His blood pressure was much better controlled at 110/72.  He presents to the clinic today for follow-up evaluation and states he feels well.  He denies chest pain lower extremity edema, and syncopal events.  He states that he does have some weakness in his lower extremities but is limited in his physical activity due to his back pain.  He has tolerated his medication changes well  and his blood pressures much better controlled.  He also complains of a cough that he has seen his PCP for.  He was prescribed Flonase which is helped somewhat.  Lungs are clear to auscultation today on exam.  I have instructed him to try over-the-counter Zyrtec, use a humidifier, and changes HVAC filters.  I have also instructed him to use a cane with walking as an extra base of support.  I will give him the salty 6 diet sheet and have him follow-up with Dr. Gwenlyn Found or myself in 6 months.  Today he denies chest pain, shortness of breath, lower extremity edema, fatigue, palpitations, melena, hematuria, hemoptysis, diaphoresis, weakness, presyncope, syncope, orthopnea, and PND.   Home Medications    Prior to Admission medications   Medication Sig Start Date End Date Taking? Authorizing Provider  aspirin EC 325 MG EC tablet Take 1 tablet (325 mg total) by mouth daily. 07/29/19   Elgie Collard, PA-C  atorvastatin (LIPITOR) 80 MG tablet TAKE 1 TABLET BY MOUTH DAILY AT SIX IN THE EVENING 03/18/20   Lorretta Harp, MD  carvedilol (COREG) 25 MG tablet TAKE 1 TABLET  BY MOUTH TWICE DAILY WITH A MEAL 05/03/20   Prescott Gum, Collier Salina, MD  sacubitril-valsartan (ENTRESTO) 24-26 MG Take 1 tablet by mouth 2 (two) times daily. 04/28/20   Lorretta Harp, MD    Family History    Family History  Problem Relation Age of Onset  . Seizures Mother   . Cancer Father   . Coronary artery disease Brother   . Diabetes Brother    He indicated that his mother is deceased. He indicated that his father is deceased. He indicated that the status of his brother is unknown.  Social History    Social History   Socioeconomic History  . Marital status: Married    Spouse name: [name]  . Number of children: 4  . Years of education: Not on file  . Highest education level: Not on file  Occupational History  . Not on file  Tobacco Use  . Smoking status: Former Smoker    Packs/day: 1.00    Years: 44.00    Pack years:  44.00    Types: Cigarettes    Quit date: 08/26/1997    Years since quitting: 22.8  . Smokeless tobacco: Former Systems developer    Types: Chew  Substance and Sexual Activity  . Alcohol use: Yes    Comment: 08/18/2013 "glass of wine 5-6 months ago; very rare"  . Drug use: No  . Sexual activity: Never  Other Topics Concern  . Not on file  Social History Narrative  . Not on file   Social Determinants of Health   Financial Resource Strain:   . Difficulty of Paying Living Expenses: Not on file  Food Insecurity:   . Worried About Charity fundraiser in the Last Year: Not on file  . Ran Out of Food in the Last Year: Not on file  Transportation Needs:   . Lack of Transportation (Medical): Not on file  . Lack of Transportation (Non-Medical): Not on file  Physical Activity:   . Days of Exercise per Week: Not on file  . Minutes of Exercise per Session: Not on file  Stress:   . Feeling of Stress : Not on file  Social Connections:   . Frequency of Communication with Friends and Family: Not on file  . Frequency of Social Gatherings with Friends and Family: Not on file  . Attends Religious Services: Not on file  . Active Member of Clubs or Organizations: Not on file  . Attends Archivist Meetings: Not on file  . Marital Status: Not on file  Intimate Partner Violence:   . Fear of Current or Ex-Partner: Not on file  . Emotionally Abused: Not on file  . Physically Abused: Not on file  . Sexually Abused: Not on file     Review of Systems    General:  No chills, fever, night sweats or weight changes.  Cardiovascular:  No chest pain, dyspnea on exertion, edema, orthopnea, palpitations, paroxysmal nocturnal dyspnea. Dermatological: No rash, lesions/masses Respiratory: No cough, dyspnea Urologic: No hematuria, dysuria Abdominal:   No nausea, vomiting, diarrhea, bright red blood per rectum, melena, or hematemesis Neurologic:  No visual changes, wkns, changes in mental status. All other  systems reviewed and are otherwise negative except as noted above.  Physical Exam    VS:  BP 129/70   Pulse (!) 59   Ht 5\' 3"  (1.6 m)   Wt 192 lb 3.2 oz (87.2 kg)   SpO2 98%   BMI 34.05 kg/m  , BMI Body  mass index is 34.05 kg/m. GEN: Well nourished, well developed, in no acute distress. HEENT: normal. Neck: Supple, no JVD, carotid bruits, or masses. Cardiac: RRR, no murmurs, rubs, or gallops. No clubbing, cyanosis, edema.  Radials/DP/PT 2+ and equal bilaterally.  Respiratory:  Respirations regular and unlabored, clear to auscultation bilaterally. GI: Soft, nontender, nondistended, BS + x 4. MS: no deformity or atrophy. Skin: warm and dry, no rash. Neuro:  Strength and sensation are intact. Psych: Normal affect.  Accessory Clinical Findings    Recent Labs: 07/23/2019: Magnesium 2.0 08/13/2019: Hemoglobin 10.9; Platelets 308 08/27/2019: BUN 24; Creatinine, Ser 1.28; Potassium 5.3; Sodium 140 12/11/2019: ALT 14   Recent Lipid Panel    Component Value Date/Time   CHOL 100 12/11/2019 1133   TRIG 96 12/11/2019 1133   HDL 44 12/11/2019 1133   CHOLHDL 2.3 12/11/2019 1133   LDLCALC 38 12/11/2019 1133    ECG personally reviewed by me today-sinus bradycardia with first-degree AV block 59 bpm- No acute changes  EKG 08/13/2019 Sinus rhythm with first-degree AV block T wave abnormality consider anterior lateral ischemia.  67 bpm  Echocardiogram 10/27/2019 IMPRESSIONS   1. Left ventricular ejection fraction, by visual estimation, is 55 to 60%. The left ventricle has normal function. There is moderately increased left ventricular hypertrophy. 2. Left ventricular diastolic parameters are consistent with Grade I diastolic dysfunction (impaired relaxation). 3. The left ventricle has no regional wall motion abnormalities. 4. Global right ventricle has normal systolic function.The right ventricular size is normal. No increase in right ventricular wall thickness. 5. Left atrial  size was normal. 6. Right atrial size was normal. 7. The mitral valve is normal in structure. Mild mitral valve regurgitation. No evidence of mitral stenosis. 8. The tricuspid valve is normal in structure. 9. The aortic valve is normal in structure. Aortic valve regurgitation is not visualized. Mild to moderate aortic valve sclerosis/calcification without any evidence of aortic stenosis. 10. The pulmonic valve was normal in structure. Pulmonic valve regurgitation is trivial. 11. The inferior vena cava is normal in size with greater than 50% respiratory variability, suggesting right atrial pressure of 3 mmHg.  Assessment & Plan   1.   Essential hypertension-BP today 129/70.  Well-controlled at home. Continue carvedilol to 25 mg twice daily Continue Entresto 24-26 mg Continue spironolactone 12.5 milligrams daily Heart healthy low-sodium diet-salty 6 given Increase physical activity as tolerated Blood pressure log  Coronary artery disease-no chest pain today.  No recent events of chest pain Continue aspirin 325 daily Carvedilol 25 mg twice daily Continue atorvastatin 80 mg daily Heart healthy low-sodium diet Increase physical activity as tolerated  Acute systolic heart failure/ischemic cardiomyopathy-CABG x2 06/2019, LIMA-LAD, SVG-PDA.  Echocardiogram 10/27/2019 showed EF 55 to 60%.  Euvolemic. Continue Entresto 25-26 Continue carvedilol Continue spironolactone  Hyperlipidemia-LDL goal below 70.  LDL 38 on 12/11/2019. Atorvastatin 80 mg daily Heart healthy low-sodium high-fiber diet Increase physical activity as tolerated Order lipid panel  In one month fasting  Cough-has a dry cough that has been present for 1-2 months.  Nonproductive.  No fevers.  Lungs are clear to auscultation.  Presented to PCP who prescribed Flonase.  This seems to be related to environmental allergens/irritants.   May use Zyrtec as needed Change HVAC filters Humidify sleeping area Avoid environmental  irritants  Disposition: Follow-up with Dr. Gwenlyn Found in 6 months.   Jossie Ng. Ziomara Birenbaum NP-C    06/13/2020, 2:11 PM [facility] [address] Office [phone] Fax [phone]  Notice:  This dictation was prepared with Dragon dictation along with smaller phrase technology. Any transcriptional errors that result from this process are unintentional and may not be corrected upon review.

## 2020-06-13 ENCOUNTER — Other Ambulatory Visit: Payer: Self-pay

## 2020-06-13 ENCOUNTER — Encounter: Payer: Self-pay | Admitting: General Practice

## 2020-06-13 ENCOUNTER — Ambulatory Visit: Payer: Medicare Other | Admitting: General Practice

## 2020-06-13 VITALS — BP 129/70 | HR 59 | Ht 63.0 in | Wt 192.2 lb

## 2020-06-13 DIAGNOSIS — I5021 Acute systolic (congestive) heart failure: Secondary | ICD-10-CM | POA: Diagnosis not present

## 2020-06-13 DIAGNOSIS — I10 Essential (primary) hypertension: Secondary | ICD-10-CM

## 2020-06-13 DIAGNOSIS — I255 Ischemic cardiomyopathy: Secondary | ICD-10-CM

## 2020-06-13 DIAGNOSIS — E785 Hyperlipidemia, unspecified: Secondary | ICD-10-CM

## 2020-06-13 DIAGNOSIS — I2510 Atherosclerotic heart disease of native coronary artery without angina pectoris: Secondary | ICD-10-CM

## 2020-06-13 DIAGNOSIS — R059 Cough, unspecified: Secondary | ICD-10-CM

## 2020-06-13 DIAGNOSIS — R05 Cough: Secondary | ICD-10-CM

## 2020-06-13 NOTE — Patient Instructions (Signed)
Medication Instructions:  MAY TAKE ZYRTEC AS NEEDED FOR ALLERGIES *If you need a refill on your cardiac medications before your next appointment, please call your pharmacy*  Special Instructions MAKE SURE TO DO LEG EXTENSIONS AND CALF RAISES WHILE SITTING.  MAKE SURE TO USE CANE IN CROWDED PLACES-OUTSIDE THE HOME  PLEASE READ AND FOLLOW SALTY 6-ATTACHED  USE HUMIDIFIER AT BEDTIME  MAKE SURE TO CHANGE A/C AND HEATER FILTERS  MAKE SURE TO USE MASK WHEN MOWING THE LAWN.  Follow-Up: Your next appointment:  6 month(s)  In Person with Quay Burow, MD -[place], FNP-C  At [facility], you and your health needs are our priority.  As part of our continuing mission to provide you with exceptional heart care, we have created designated Provider Care Teams.  These Care Teams include your primary Cardiologist (physician) and Advanced Practice Providers (APPs -  Physician Assistants and Nurse Practitioners) who all work together to provide you with the care you need, when you need it.

## 2020-07-08 ENCOUNTER — Ambulatory Visit
Admission: RE | Admit: 2020-07-08 | Discharge: 2020-07-08 | Disposition: A | Discharge status: Home / Self Care | Payer: Medicare Other | Source: Ambulatory Visit | Attending: Family Medicine | Admitting: Family Medicine

## 2020-07-08 ENCOUNTER — Other Ambulatory Visit: Payer: Self-pay | Admitting: Family Medicine

## 2020-07-08 DIAGNOSIS — R059 Cough, unspecified: Secondary | ICD-10-CM

## 2020-09-05 ENCOUNTER — Other Ambulatory Visit: Payer: Self-pay | Admitting: Cardiovascular Disease

## 2020-09-05 MED ORDER — CARVEDILOL 25 MG PO TABS: 25.0000 mg | ORAL_TABLET | Freq: Two times a day (BID) | ORAL | 6 refills | Status: DC

## 2020-09-05 NOTE — Telephone Encounter (Signed)
Rx has been sent to the pharmacy electronically. ° °

## 2020-09-05 NOTE — Telephone Encounter (Signed)
°*  STAT* If patient is at the pharmacy, call can be transferred to refill team.   1. Which medications need to be refilled? (please list name of each medication and dose if known)  caredilool  2. Which pharmacy/location (including street and city if local pharmacy) is medication to be sent to?[facility]  3. Do they need a 30 day or 90 day supply? Not sure

## 2020-09-19 ENCOUNTER — Other Ambulatory Visit: Payer: Self-pay | Admitting: Cardiovascular Disease

## 2020-10-07 ENCOUNTER — Ambulatory Visit
Admission: RE | Admit: 2020-10-07 | Discharge: 2020-10-07 | Disposition: A | Discharge status: Home / Self Care | Payer: Medicare Other | Source: Ambulatory Visit | Attending: Family Medicine | Admitting: Family Medicine

## 2020-10-07 ENCOUNTER — Other Ambulatory Visit: Payer: Self-pay | Admitting: Family Medicine

## 2020-10-07 ENCOUNTER — Other Ambulatory Visit: Payer: Self-pay

## 2020-10-07 DIAGNOSIS — R059 Cough, unspecified: Secondary | ICD-10-CM

## 2020-10-10 ENCOUNTER — Other Ambulatory Visit: Payer: Self-pay | Admitting: Family Medicine

## 2020-10-10 DIAGNOSIS — M545 Low back pain, unspecified: Secondary | ICD-10-CM

## 2020-10-10 DIAGNOSIS — M5430 Sciatica, unspecified side: Secondary | ICD-10-CM

## 2020-10-20 ENCOUNTER — Telehealth: Payer: Self-pay

## 2020-10-20 NOTE — Telephone Encounter (Signed)
[employer] PAP received, completed and faxed.

## 2020-10-30 ENCOUNTER — Ambulatory Visit
Admission: RE | Admit: 2020-10-30 | Discharge: 2020-10-30 | Disposition: A | Discharge status: Home / Self Care | Payer: Medicare Other | Source: Ambulatory Visit | Attending: Family Medicine | Admitting: Family Medicine

## 2020-10-30 ENCOUNTER — Other Ambulatory Visit: Payer: Self-pay

## 2020-10-30 DIAGNOSIS — M545 Low back pain, unspecified: Secondary | ICD-10-CM

## 2020-10-30 DIAGNOSIS — M5430 Sciatica, unspecified side: Secondary | ICD-10-CM

## 2020-11-14 ENCOUNTER — Ambulatory Visit: Payer: Medicare Other | Admitting: Internal Medicine

## 2020-11-14 ENCOUNTER — Encounter: Payer: Self-pay | Admitting: Internal Medicine

## 2020-11-14 ENCOUNTER — Other Ambulatory Visit: Payer: Self-pay

## 2020-11-14 VITALS — BP 120/78 | HR 109 | Ht 63.0 in | Wt 204.4 lb

## 2020-11-14 DIAGNOSIS — I2510 Atherosclerotic heart disease of native coronary artery without angina pectoris: Secondary | ICD-10-CM

## 2020-11-14 DIAGNOSIS — I25810 Atherosclerosis of coronary artery bypass graft(s) without angina pectoris: Secondary | ICD-10-CM

## 2020-11-14 DIAGNOSIS — K219 Gastro-esophageal reflux disease without esophagitis: Secondary | ICD-10-CM

## 2020-11-14 DIAGNOSIS — R0602 Shortness of breath: Secondary | ICD-10-CM

## 2020-11-14 DIAGNOSIS — J310 Chronic rhinitis: Secondary | ICD-10-CM | POA: Diagnosis not present

## 2020-11-14 DIAGNOSIS — I252 Old myocardial infarction: Secondary | ICD-10-CM

## 2020-11-14 DIAGNOSIS — Z01811 Encounter for preprocedural respiratory examination: Secondary | ICD-10-CM

## 2020-11-14 MED ORDER — FLUTICASONE PROPIONATE 50 MCG/ACT NA SUSP: 1.0000 | Freq: Every day | NASAL | 5 refills | Status: DC

## 2020-11-14 MED ORDER — CETIRIZINE HCL 10 MG PO TABS: 10.0000 mg | ORAL_TABLET | Freq: Every day | ORAL | 5 refills | Status: DC

## 2020-11-14 MED ORDER — FLUTICASONE PROPIONATE 50 MCG/ACT NA SUSP: 1.0000 | Freq: Every day | NASAL | 5 refills | Status: AC

## 2020-11-14 NOTE — Progress Notes (Signed)
Philip Hurst    [id number]    December 12, 1940  Primary Care Physician:Elkins, Curt Jews, MD  Referring Physician: Leonard Downing, MD [place],  [place] [address] Reason for Consultation: chronic cough Date of Consultation: 11/14/2020  Chief complaint:   Chief Complaint  Patient presents with  . Consult    Patient is being referred by PCP due to chronic cough.  Pt states he has had the cough about 1-2 years which he states it is mainly a dry cough but will occ cough up some phlegm. Denies any complaints of SOB or wheezing.     HPI: Philip Hurst is a 80 y.o. gentleman with a history of CABG in September of 2020 who presents with dry hacking cough of 2 years duration which is progressively getting worse. Cough is any time day or night - and it does wake him up at night. Takes cough drops to help settle - sometimes takes 3-4 at night. He does get sob with walking to the mailbox and back (his driveway is on a hill.) relieved with rest. Denies chest pain. He has had pneumonia once before when he was hospitalized in 2020.   He also has chronic back pain and is planning to undergo surgery for spinal stenosis with [facility] Dr. Lynann Bologna. Recent burst of prednisone for back pain.Here for preop evaluation as well. Did well with CABG 2 years ago - no prolonged hypoxemia or need for mechanical ventilation.   He has been having worsening heart burn over the last few days and has been taking tums and baking soda which helps. He does have runny nose for the last couple years. He denies any post nasal drainage. No sore throat.   He is taking flonase nasal spray and this helps some. He is also taking an over the counter decongestant as well.   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 Exposures: lives at home with his wife and a kitten they recently got  Smoking history: 50 pack year smoking history,  quit in [place]   Occupational History  . Not on file  Tobacco Use  . Smoking status: Former Smoker    Packs/day: 1.00    Years: 50.00    Pack years: 50.00    Types: Cigarettes    Quit date: 08/26/1997    Years since quitting: 23.2  . Smokeless tobacco: Former Systems developer    Types: Chew  Substance and Sexual Activity  . Alcohol use: Yes    Comment: 08/18/2013 "glass of wine 5-6 months ago; very rare"  . Drug use: No  . Sexual activity: Never    Relevant family history:  Family History  Problem Relation Age of Onset  . Seizures Mother   . Cancer Father   . Coronary artery disease Brother   . Diabetes Brother   . Lung cancer Brother   . Lung cancer Sister     Past Medical History:  Diagnosis Date  . Arthritis    "back, joints in right hand" (08/18/2013)  . CAD (coronary artery disease), single vessel disease with collaterals, medical therapy 09/12/2011  . Chronic lower back pain   . Claudication of left lower extremity, ABI Lt 0.52 -lifestyle limiting  ABI rt 0.90 01/30/2013  . Coronary artery disease    OCCLUDED RCA  . Hyperlipidemia   . Hypertension   . Normal cardiac stress  test, No ischemia 12/15/12 01/30/2013  . PAD (peripheral artery disease) ([place])     Past Surgical History:  Procedure Laterality Date  . BACK SURGERY    . CARDIAC CATHETERIZATION  @2002    1 vessel disease w/collaterals,medical therapy  . CARDIOVASCULAR STRESS TEST  12/07/2008   EF 58%, NO ISCHEMIA  . CATARACT EXTRACTION W/ INTRAOCULAR LENS  IMPLANT, BILATERAL Bilateral   . CORONARY ARTERY BYPASS GRAFT  07/22/2019   Procedure: OFF PUMP CORONARY ARTERY BYPASS GRAFTING (CABG) times two using left internal mammary artery and right leg saphenous vein;  Surgeon: Lajuana Matte, MD;  Location: [facility] OR;  Service: Open Heart Surgery;;  . 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 LEFT HEART CATH AND CORONARY ANGIOGRAPHY N/A 07/19/2019   Procedure: LEFT HEART CATH AND  CORONARY ANGIOGRAPHY;  Surgeon: Belva Crome, MD;  Location: [facility];  Service: Cardiovascular;  Laterality: N/A;  . lower extremities arterial doppler  02/16/2013   bilateral ABIs normal values w/o evidence arterial insuff. at rest; left  lower extrem. norm. left popliteal artery stent norm. patency  . LOWER EXTREMITY ANGIOGRAM Left 08/18/2013   Procedure: LOWER EXTREMITY ANGIOGRAM;  Surgeon: Lorretta Harp, MD;  Location: [facility] C[facility] LAB;  Service: Cardiovascular;  Laterality: Left;  . LUMBAR DISC SURGERY  1990's   "herniated disc" (08/18/2013)  . PERCUTANEOUS STENT INTERVENTION  01/30/2013   Procedure: PERCUTANEOUS STENT INTERVENTION;  Surgeon: Lorretta Harp, MD;  Location: [facility] CATH LAB;  Service: Cardiovascular;;  . PERIPHERAL VASCULAR CATHETERIZATION N/A 09/05/2015   Procedure: Lower Extremity Angiography;  Surgeon: Lorretta Harp, MD;  Location: [facility];  Service: Cardiovascular;  Laterality: N/A;  . POPLITEAL ARTERY STENT Left 01/30/13  . POPLITEAL ARTERY STENT Left 10/'28/2014   successful Viabahn covered stent placement within the previously placed IDEV  stents/notes 08/18/2013  . POSTERIOR LUMBAR FUSION  1990's  . TONSILLECTOMY    . WRIST FUSION Right 1990's?   "first OR didn't correct problem" (08/18/2013)     Physical Exam: Blood pressure 120/78, pulse (!) 109, height 5\' 3"  (1.6 m), weight 204 lb 6.4 oz (92.7 kg), SpO2 95 %. Gen:      No acute distress ENT:  no nasal polyps, mucus membranes moist, mild nasal debris, mallampati IV Lungs:    No increased respiratory effort, symmetric chest wall excursion, clear to auscultation bilaterally, no wheezes or crackles CV:         Regular rate and rhythm; no murmurs, rubs, or gallops.  No pedal edema Abd:      Obese, soft, + bowel sounds, non-tender; no distension MSK: no acute synovitis of DIP or PIP joints, no mechanics hands.  Skin:      Warm and dry; no rashes Neuro: normal speech, no focal facial  asymmetry Psych: alert and oriented x3, normal mood and affect   Data Reviewed/Medical Decision Making:  Independent interpretation of tests: Imaging: . Review of patient's chest xray September and December 2021 images revealed chronic bronchiectatic changes in bases consistent with COPD. The patient's images have been independently reviewed by me.    Echocardiogram Sept 2020 1. Left ventricular ejection fraction, by visual estimation, is 55 to  60%. The left ventricle has normal function. There is moderately increased  left ventricular hypertrophy.  2. Left ventricular diastolic parameters are consistent with Grade I  diastolic dysfunction (impaired relaxation).  3. The left ventricle has no regional wall motion abnormalities.  4. Global right ventricle has  normal systolic function.The right  ventricular size is normal. No increase in right ventricular wall  thickness.  5. Left atrial size was normal.  6. Right atrial size was normal.  7. The mitral valve is normal in structure. Mild mitral valve  regurgitation. No evidence of mitral stenosis.  8. The tricuspid valve is normal in structure.  9. The aortic valve is normal in structure. Aortic valve regurgitation is  not visualized. Mild to moderate aortic valve sclerosis/calcification  without any evidence of aortic stenosis.  10. The pulmonic valve was normal in structure. Pulmonic valve  regurgitation is trivial.  11. The inferior vena cava is normal in size with greater than 50%  respiratory variability, suggesting right atrial pressure of 3 mmHg.   PFTs: I have personally reviewed the patient's PFTs and no airflow limitation.  PFT Results Latest Ref Rng & Units 07/21/2019  FVC-Pre L 1.30  FVC-Predicted Pre % 43  Pre FEV1/FVC % % 77  FEV1-Pre L 1.00  FEV1-Predicted Pre % 47    Labs:  Lab Results  Component Value Date   WBC 7.0 08/13/2019   HGB 10.9 (L) 08/13/2019   HCT 33.9 (L) 08/13/2019   MCV 94  08/13/2019   PLT 308 08/13/2019   Lab Results  Component Value Date   NA 140 08/27/2019   K 5.3 (H) 08/27/2019   CL 104 08/27/2019   CO2 21 08/27/2019     Immunization status:  Immunization History  Administered Date(s) Administered  . Influenza, High Dose Seasonal PF 06/22/2020    . I reviewed prior external note(s) from PCP, hospital stay, CT surgery and cardiology.  . I reviewed the result(s) of the labs and imaging as noted above.  . I have ordered PFTs  Assessment:  Chronic Cough Shortness of breath - differential includes CAD, deconditioning, possible COPD CAD s/p CABG Spinal Stenosis Preoperative Pulmonary Evaluation  Plan/Recommendations:  Chronic cough may be secondary to post nasal drainage vs GERD.  He seems to think drainage. Will continue flonase, modified technique today. Stop OTC decongestants. Will start cetirizine nocturnally.  Continue tums for GERD - if still coughing will try treating GERD next with PPI trial. Will obtain a full set of PFTs for dyspnea. I will see him back after this.   Preoperative Risk Calculation: The features of this patient's history that contribute to the pulmonary risk assessment include: Age, COPD, General anesthesia, throacic surgery, surgery >2 hours.  This patient has a low risk of post-operative pulmonary complications by ARISCAT Index.  The absolute assessment of risk/benefit of the procedure is deferred to the primary team's evaluation.  - Patient's Estimated risk of postoperative respiratory failure is 1.6% (16 points for age) based on the ARISCAT Index.   0 to 25 points: Low risk: [id number] pulmonary complication rate  26 to 44 points: Intermediate risk: 0000000 pulmonary complication rate  45 to 123 points: High risk: [id number] pulmonary complication rate  Postoperative respiratory failure (PRF) is considered as failure to wean from mechanical ventilation within 48 hours of surgery or unplanned intubation/reintubation  postoperatively. The validated risk calculator provides a risk estimate of PRF and is anticipated to aid in surgical decision-making and informed patient consent.  However risk can be accepted given the potential benefit of this intervention and it is not prohibitive.  RECOMMENDATIONS:  In order to minimize the risk of complications and optimize pulmonary status, we recommend the following: - Encourage aggressive incentive spirometry hourly both peri-operatively and post-operatively as tolerated  - Early ambulation  and physical therapy as tolerated post-operatively - Adequate pain control especially in the setting of abdominal and thoracic surgery - Bronchodilators as needed for wheezing or shortness of breath - Oral steroids only if the patient appears to have component of COPD exacerbation, otherwise no routine utilization needed - Intraoperatively keep OR time to the shortest as possible  ARISCAT: Mazo et al. Anesthesiology 2014; [phone]  I spent 45 minutes in the care of this patient today including pre-charting, chart review, review of results, face-to-face care, coordination of care and communication with consultants etc.).    Return to Care: Return in about 3 months (around 02/12/2021).  Philip Llamas, MD Pulmonary and [place]  CC: Leonard Downing, *

## 2020-11-14 NOTE — Patient Instructions (Signed)
The patient should have follow up scheduled with myself in months.   Prior to next visit patient should have: Full set of PFTs  Keep taking flonase nasal spray. Stop the over the counter decongestant and start taking cetirizine at bedtime.   Flonase - 1 spray on each side of your nose twice a day for first week, then 1 spray on each side.   Instructions for use:  If you also use a saline nasal spray or rinse, use that first.  Position the head with the chin slightly tucked. Use the right hand to spray into the left nostril and the right hand to spray into the left nostril.   Point the bottle away from the septum of your nose (cartilage that divides the two sides of your nose).   Hold the nostril closed on the opposite side from where you will spray  Spray once and gently sniff to pull the medicine into the higher parts of your nose.  Don't sniff too hard as the medicine will drain down the back of your throat instead.  Repeat with a second spray on the same side if prescribed.  Repeat on the other side of your nose.

## 2020-11-16 ENCOUNTER — Other Ambulatory Visit: Payer: Self-pay | Admitting: Orthopedic Surgery

## 2020-11-23 IMAGING — DX DG CHEST 1V PORT
1 series · 1 of 1 positions shown · non-contrast
Comparison: 07/16/2019

CLINICAL DATA: Pneumonia.  Shortness of breath and volume overload.

EXAM:
PORTABLE CHEST 1 VIEW

[chest]
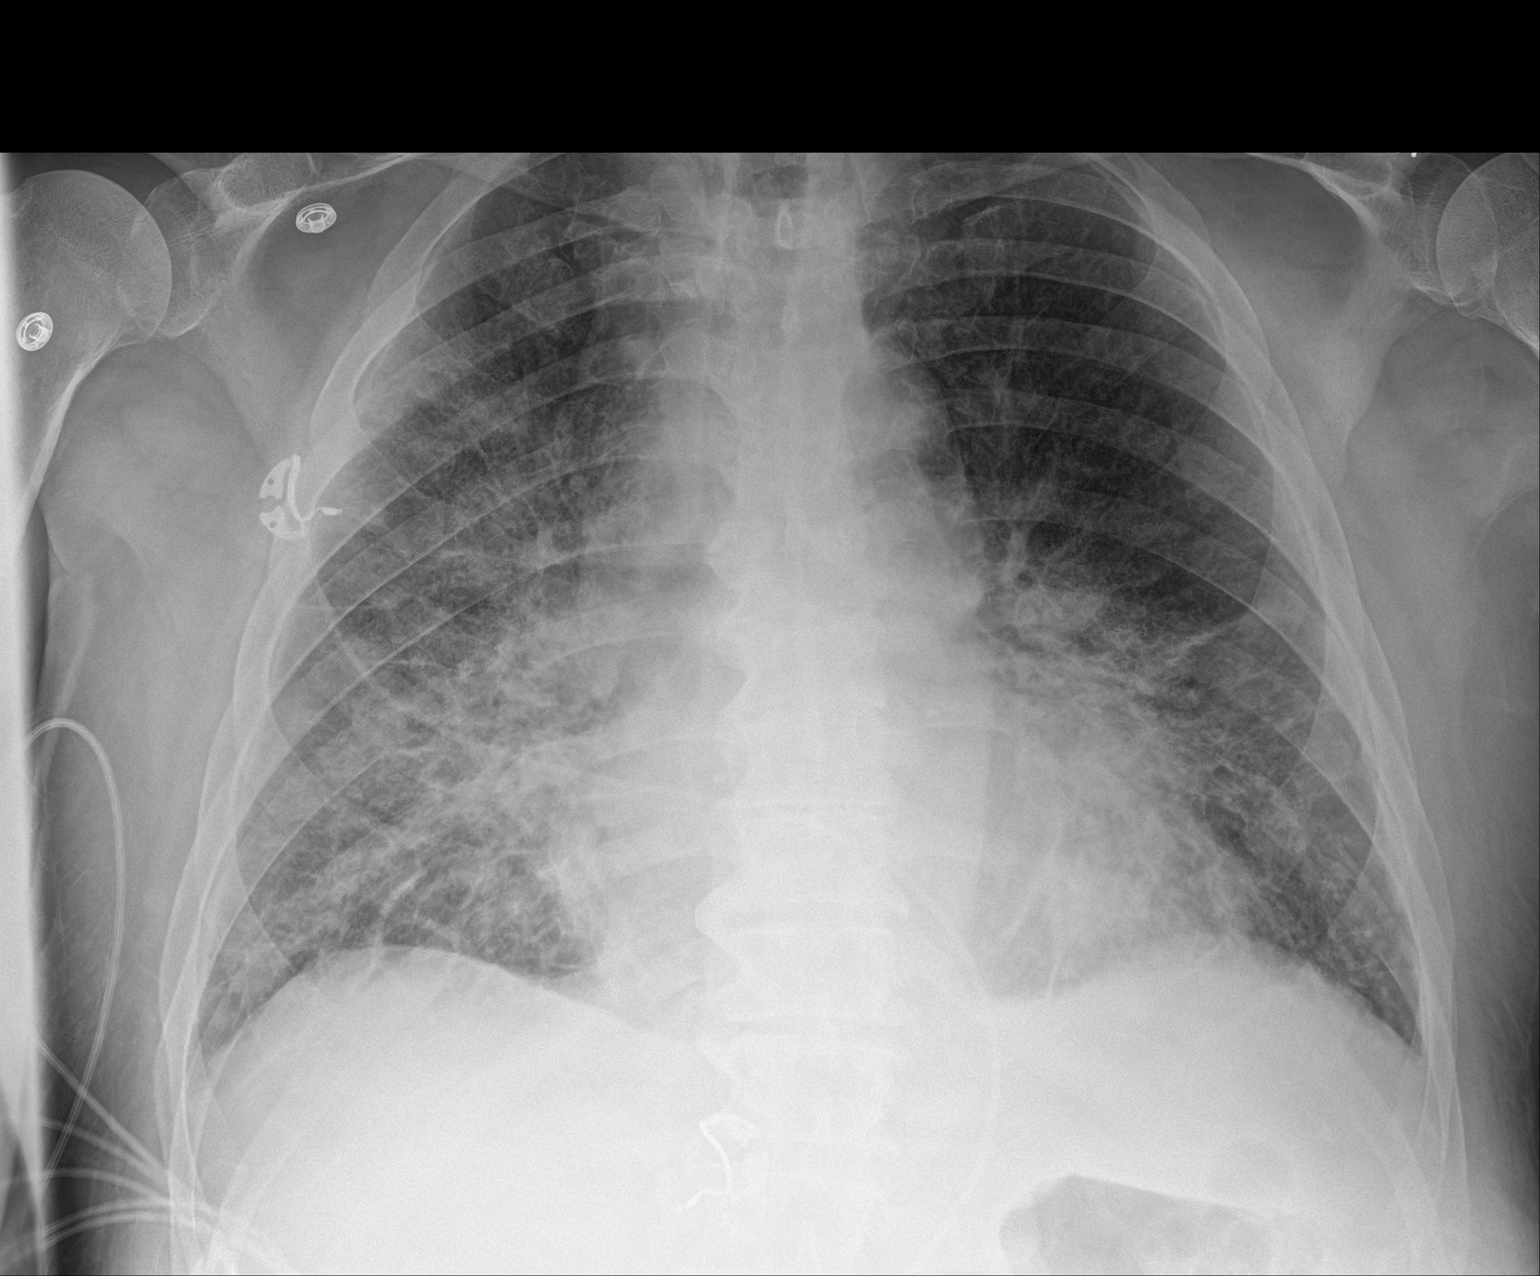

[1 of 1 positions shown; findings below may reference images not displayed]

FINDINGS: Mild cardiomegaly. Bilateral lower lobe and right upper lobe
airspace opacities are identified. Mild diffuse superimposed
pulmonary edema. Osseous structures appear intact.
IMPRESSION: 1. Worsening bilateral airspace opacities with new superimposed
pulmonary edema.

## 2020-11-23 IMAGING — CT CT ANGIO CHEST
2 of 7 series · 17 of 46 positions shown · IV contrast (APPLIED)
Comparison: Chest x-ray July 16, 2019

CLINICAL DATA: Cough and pneumonia. Elevated D-dimer. History of
DVT.

EXAM:
CT ANGIOGRAPHY CHEST WITH CONTRAST
TECHNIQUE: Multidetector CT imaging of the chest was performed using the
standard protocol during bolus administration of intravenous
contrast. Multiplanar CT image reconstructions and MIPs were
obtained to evaluate the vascular anatomy.
CONTRAST:  55mL OMNIPAQUE IOHEXOL 350 MG/ML SOLN

[Series 7: thins · axial · 0.76mm/px · z∈[+1153,+1414]mm · 14 of 421 slices shown]
[im 24/421  lung]
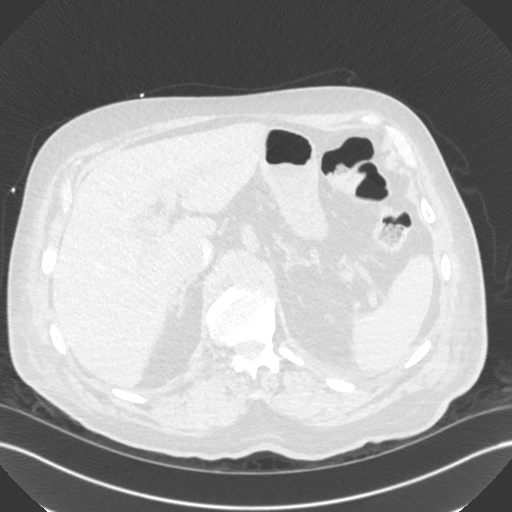
[im 47/421  soft-tissue]
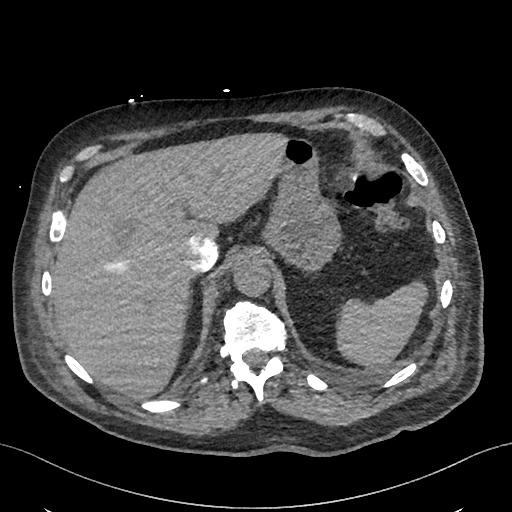
[im 94/421  lung]
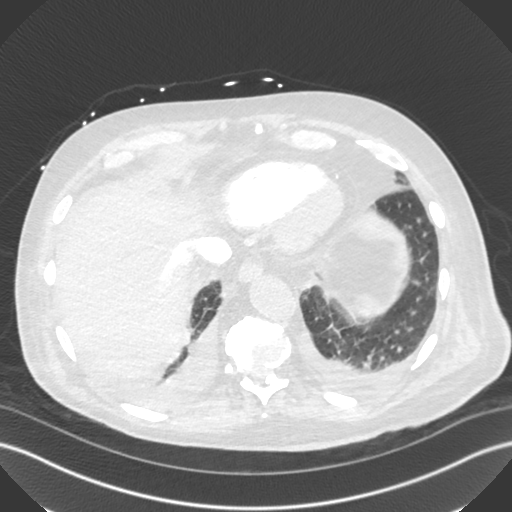
[im 117/421  soft-tissue]
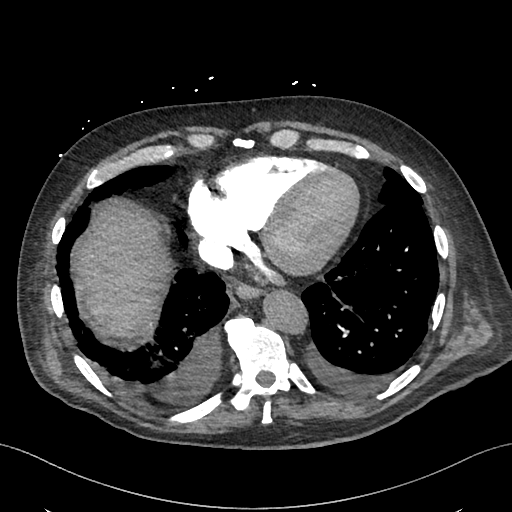
[im 141/421  lung]
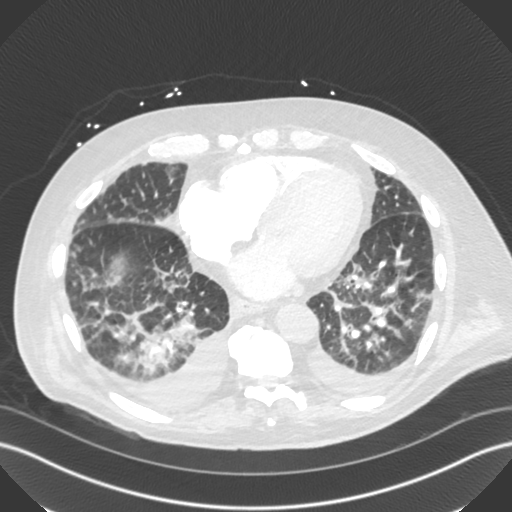
[im 164/421  soft-tissue]
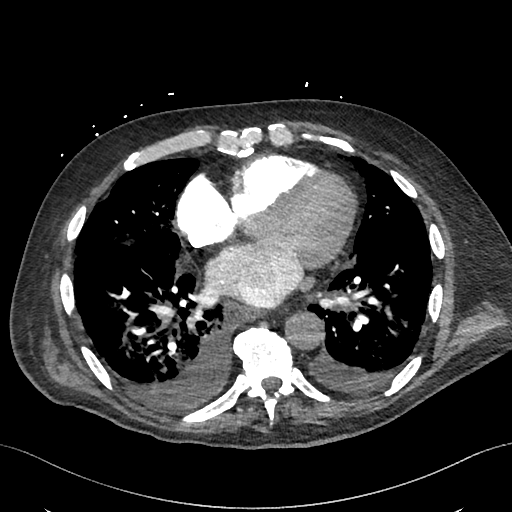
[im 187/421  lung]
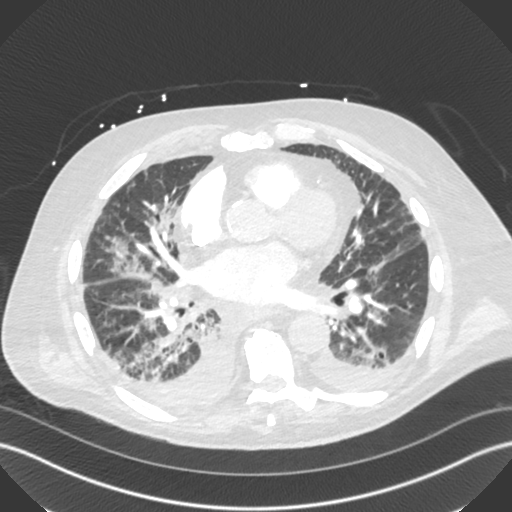
[im 234/421  soft-tissue]
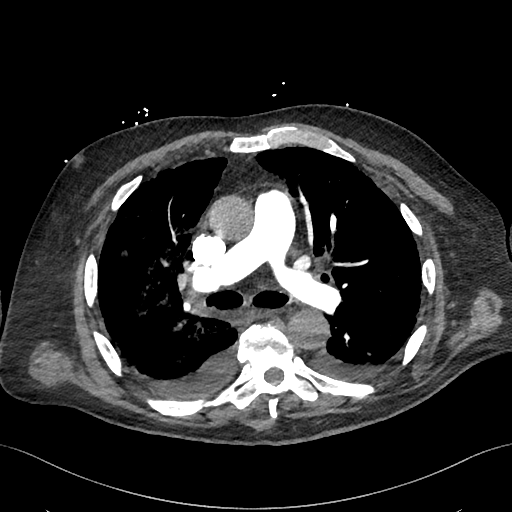
[im 257/421  lung]
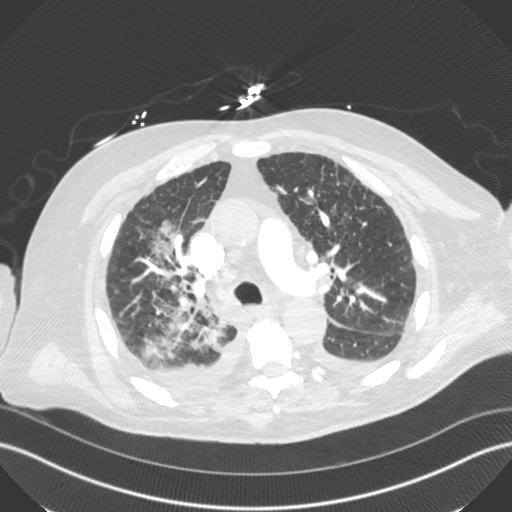
[im 281/421  soft-tissue]
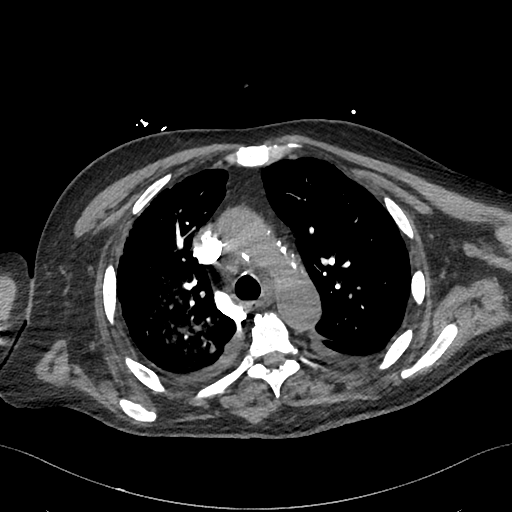
[im 304/421  lung]
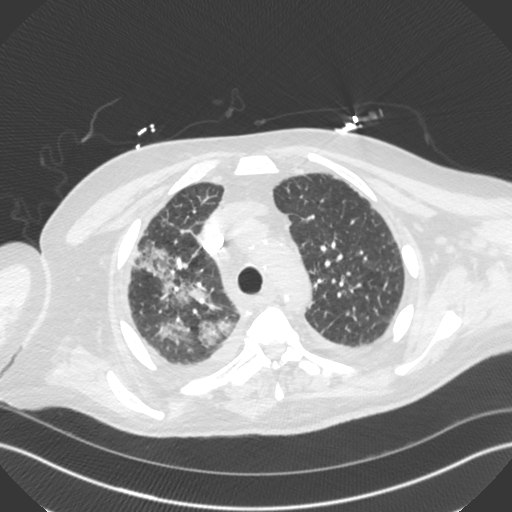
[im 327/421  soft-tissue]
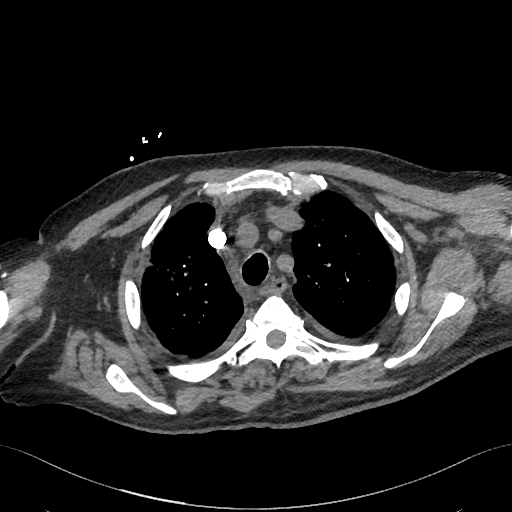
[im 374/421  lung]
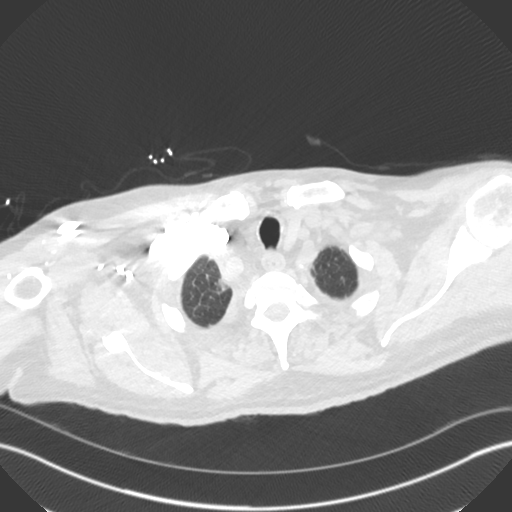
[im 397/421  soft-tissue]
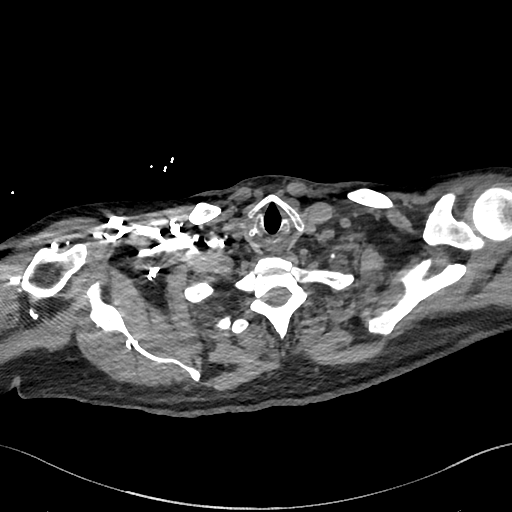

[Series 8: cor · coronal · 0.62mm/px · 3 of 144 slices shown]
[im 36/144  soft-tissue]
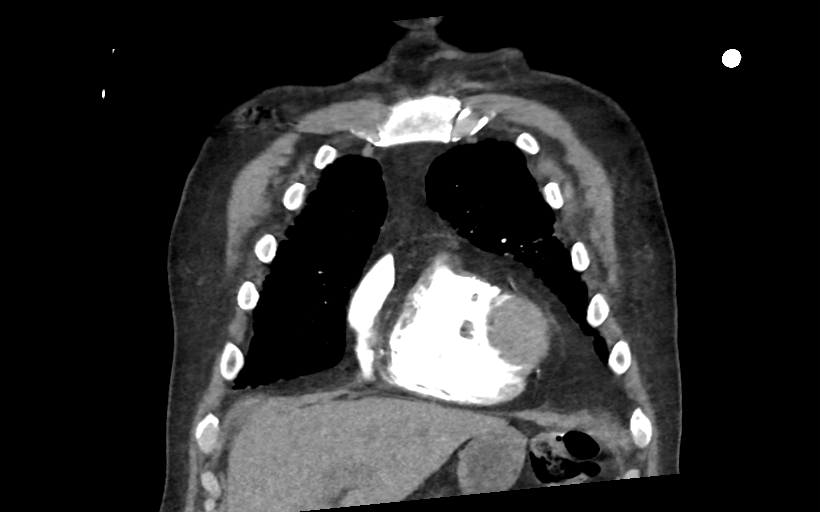
[im 72/144  soft-tissue]
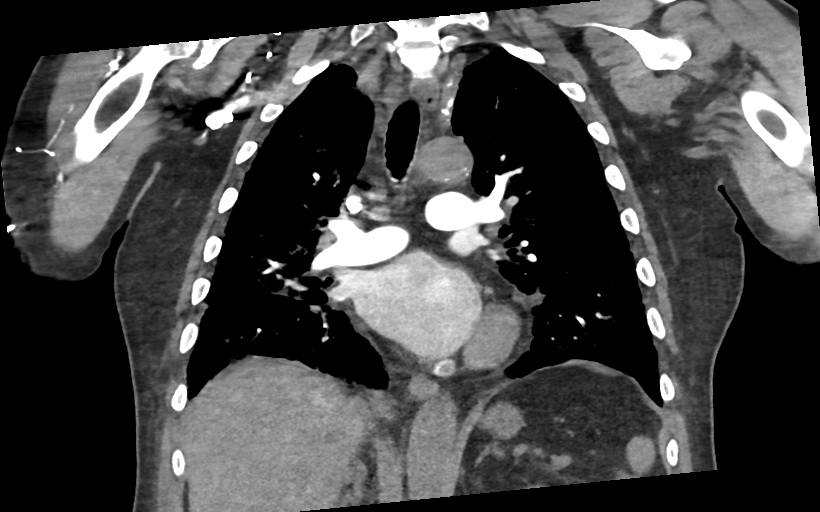
[im 108/144  soft-tissue]
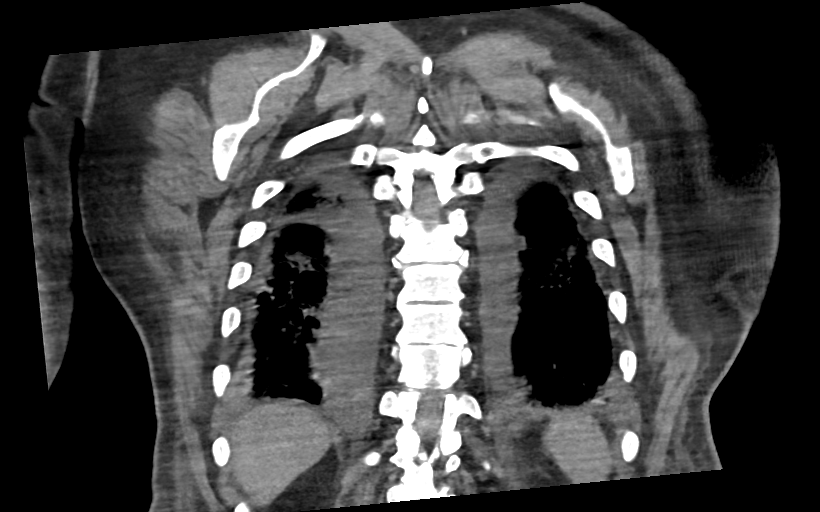

[17 of 46 positions shown; findings below may reference images not displayed]

FINDINGS: Cardiovascular: Atherosclerotic changes are seen in the
nonaneurysmal aorta. Coronary artery calcifications involve the
right and left coronary arteries. The heart size borderline to
mildly enlarged. No pulmonary emboli.

Mediastinum/Nodes: The thyroid and esophagus are normal. Several
mildly enlarged mediastinal nodes are identified. A right
paratracheal node measures 1.6 cm on series 5, image 57. A 1.7 cm
node is seen to the right of the esophagus on series 5, image 25.
There is also some increased attenuation the mediastinal fat such as
in the AP window on axial image 53.

Lungs/Pleura: Central airways are unremarkable. No pneumothorax
identified. Bilateral pulmonary infiltrates are multifocal, most
prominent in the right upper lower lobes. There is also involvement
of the right middle lobe, left upper lobe, and left lower lobe. Mild
interlobular septal thickening. No pulmonary nodules or masses.

Upper Abdomen: A small amount of ascites is seen adjacent to the
liver on series 5, image 125. The upper abdomen is otherwise
unremarkable.

Musculoskeletal: No chest wall abnormality. No acute or significant
osseous findings.

Review of the MIP images confirms the above findings.
IMPRESSION: 1. Multifocal infiltrates in the lungs are most consistent with
pneumonia.
2. Interlobular septal thickening and pleural effusions suggest
superimposed mild edema.
3. Shotty and mildly enlarged nodes in the mediastinum are likely
reactive. Recommend attention on follow-up.
4. Increased attenuation scattered the mediastinal fat occluding in
the AP window is nonspecific but could be due to volume overload.
Recommend attention on follow-up.
5. Minimal ascites in the upper abdomen is nonspecific.

## 2020-11-29 ENCOUNTER — Other Ambulatory Visit: Payer: Self-pay

## 2020-11-29 ENCOUNTER — Ambulatory Visit: Payer: Medicare Other | Admitting: Cardiovascular Disease

## 2020-11-29 ENCOUNTER — Encounter: Payer: Self-pay | Admitting: Cardiovascular Disease

## 2020-11-29 DIAGNOSIS — I255 Ischemic cardiomyopathy: Secondary | ICD-10-CM

## 2020-11-29 DIAGNOSIS — I739 Peripheral vascular disease, unspecified: Secondary | ICD-10-CM

## 2020-11-29 DIAGNOSIS — I10 Essential (primary) hypertension: Secondary | ICD-10-CM

## 2020-11-29 DIAGNOSIS — E785 Hyperlipidemia, unspecified: Secondary | ICD-10-CM | POA: Diagnosis not present

## 2020-11-29 DIAGNOSIS — I2510 Atherosclerotic heart disease of native coronary artery without angina pectoris: Secondary | ICD-10-CM | POA: Diagnosis not present

## 2020-11-29 MED ORDER — CILOSTAZOL 50 MG PO TABS: 50.0000 mg | ORAL_TABLET | Freq: Two times a day (BID) | ORAL | 3 refills | Status: DC

## 2020-11-29 NOTE — Assessment & Plan Note (Signed)
History of dyslipidemia on statin therapy with lipid profile performed 12/11/2019 revealing total cholesterol of 100, LDL of 38 and HDL 44.

## 2020-11-29 NOTE — Progress Notes (Signed)
11/29/2020 Philip Hurst   [birth year]  [id number]  Primary Physician Leonard Downing, MD Primary Cardiologist: Lorretta Harp MD Lupe Carney, [place]  HPI:  Philip Hurst is a 80 y.o.  Caucasian male whoI last saw in the office  12/15/2019. He has a history of peripheral artery disease and is having symptoms of claudication with a left lower extremity ABI of 0.52 and a right of 0.90. I last saw him in the office 10/27/14.His history also includes hypertension, coronary artery disease, hyperlipidemia. He had a normal cardiac stress test on December 15, 2012 which was nonischemic. He also has dyslipidemia and has remote tobacco abuse but quit 20 years ago. Dr. Gwenlyn Found recently performed a lower extremity angiogram which revealed an occluded popliteal artery in the left lower extremity. The right was normal. He also had widely patent renal arteries. He underwent successful balloon angioplasty and stenting of the occluded segment using a chocolate balloon, overlapping IDEV self-expanding stent. The patient now presents today in followup to that procedure. He appears to be doing quite well. He is feeling a little bit under the weather, however, he feels that the left lower extremity claudication symptoms have resolved. He has not done quite as much walking as he used to, but on one of his walks which normally would have caused him problems he did not have any pain. He also denies nausea, vomiting, fever, dizziness, lightheadedness, chest pain, shortness of breath, edema.   The patient was taking Plavix upon discharge from the hospital but developed an allergy to it and was stopped. His aspirin was then increased to 325 mg daily.he had a followup Doppler study performed 02/16/13 revealing a left ABI of 1 with a widely patent popliteal artery. the patient underwent repeat angiography October 2014 revealing an occluded IDE V stent in the left popliteal artery. He underwent Viabahn restenting with  excellent angiographic, ultrasonographic and clinical result up until present 2 weeks ago when he developed sudden onset of recurrent left calf pain. Subsequent Doppler's showed occlusion of his popliteal artery with an ABI in the 0.3 range. He underwent peripheral angiography by myself 09/05/15 revealing an occluded popliteal artery stent, occluded P3 segment of the popliteal artery on the left with reconstitution of the anterior tibial and peroneal arteries. He is considerably symptomatic with limitation of his activities of daily living. I do not think he has a percutaneous solution for revascularization given the 2 prior stenting procedures. I'm going to begin him on Pletal and for him to Dr. Trula Slade for consideration of fem-tib bypass grafting  He was admitted to the hospital 07/16/2019 with pneumonia and sepsis. He ultimately had positive enzymes and EKG changes which led to cardiac catheterization by Dr. Tamala Julian revealing an occluded dominant RCA with left-to-right collaterals, high-grade proximal LAD disease and obtuse marginal branch disease. He underwent CABG x2 by Dr. Kipp Brood 07/22/2019 with a LIMA to the LAD and a vein graft to the PDA. His EF initially was 25% which has since for risen to normal by echo 10/27/2019 after being treated with optimal medical therapy and revascularization therapy.  Since I saw him a year ago he continues to complain of left calf claudication which is lifestyle limiting.  He was also diagnosed with spinal stenosis and is seeing Dr. Lynann Bologna for potential surgery.  He denies chest pain or shortness of breath..    Current Meds  Medication Sig  . Acetaminophen 500 MG capsule Take 1,500 mg by mouth in the  morning and at bedtime.  Marland Kitchen aspirin EC 325 MG EC tablet Take 1 tablet (325 mg total) by mouth daily.  Marland Kitchen atorvastatin (LIPITOR) 80 MG tablet TAKE 1 TABLET BY MOUTH DAILY AT SIX IN THE EVENING  . cetirizine (ZYRTEC) 10 MG tablet Take 1 tablet (10 mg total) by mouth  at bedtime.  . fluticasone (FLONASE) 50 MCG/ACT nasal spray Place 1 spray into both nostrils daily.  Marland Kitchen ibuprofen (ADVIL) 200 MG tablet Take 800 mg by mouth in the morning and at bedtime.  Marland Kitchen ketoconazole (NIZORAL) 2 % shampoo Apply topically.  Marland Kitchen losartan (COZAAR) 100 MG tablet Take 100 mg by mouth daily.  . sacubitril-valsartan (ENTRESTO) 24-26 MG Take 1 tablet by mouth 2 (two) times daily.  . tamsulosin (FLOMAX) 0.4 MG CAPS capsule Take 0.8 mg by mouth daily.     Allergies  Allergen Reactions  . Plavix [Clopidogrel Bisulfate] Itching and Rash    Social History   Socioeconomic History  . Marital status: Married    Spouse name: [name]  . Number of children: 4  . Years of education: Not on file  . Highest education level: Not on file  Occupational History  . Not on file  Tobacco Use  . Smoking status: Former Smoker    Packs/day: 1.00    Years: 50.00    Pack years: 50.00    Types: Cigarettes    Quit date: 08/26/1997    Years since quitting: 23.2  . Smokeless tobacco: Former Systems developer    Types: Chew  Substance and Sexual Activity  . Alcohol use: Yes    Comment: 08/18/2013 "glass of wine 5-6 months ago; very rare"  . Drug use: No  . Sexual activity: Never  Other Topics Concern  . Not on file  Social History Narrative  . Not on file   Social Determinants of Health   Financial Resource Strain: Not on file  Food Insecurity: Not on file  Transportation Needs: Not on file  Physical Activity: Not on file  Stress: Not on file  Social Connections: Not on file  Intimate Partner Violence: Not on file     Review of Systems: General: negative for chills, fever, night sweats or weight changes.  Cardiovascular: negative for chest pain, dyspnea on exertion, edema, orthopnea, palpitations, paroxysmal nocturnal dyspnea or shortness of breath Dermatological: negative for rash Respiratory: negative for cough or wheezing Urologic: negative for hematuria Abdominal: negative for nausea,  vomiting, diarrhea, bright red blood per rectum, melena, or hematemesis Neurologic: negative for visual changes, syncope, or dizziness All other systems reviewed and are otherwise negative except as noted above.    Blood pressure (!) 145/72, pulse 85, height 5\' 3"  (1.6 m), weight 207 lb 12.8 oz (94.3 kg), SpO2 96 %.  General appearance: alert and no distress Neck: no adenopathy, no carotid bruit, no JVD, supple, symmetrical, trachea midline and thyroid not enlarged, symmetric, no tenderness/mass/nodules Lungs: clear to auscultation bilaterally Heart: regular rate and rhythm, S1, S2 normal, no murmur, click, rub or gallop Extremities: extremities normal, atraumatic, no cyanosis or edema Pulses: 2+ and symmetric Absent left pedal pulse Skin: Skin color, texture, turgor normal. No rashes or lesions Neurologic: Alert and oriented X 3, normal strength and tone. Normal symmetric reflexes. Normal coordination and gait  EKG sinus rhythm 85 without ST or T wave changes.  I personally reviewed this EKG.  ASSESSMENT AND PLAN:   Hypertension History of essential hypertension a blood pressure measured today 145/72.  He is on Entresto  CAD (  coronary artery disease), single vessel disease with collaterals, medical therapy History of CAD status post cardiac catheterization by Dr. Tamala Julian revealing an occluded RCA with left right collaterals and high-grade proximal LAD disease and obtuse marginal branch disease.  He underwent CABG x2 by Dr. Kipp Brood 07/22/2019 with a LIMA to his LAD, and vein to the PDA.  He denies chest pain or shortness of breath  Dyslipidemia, goal LDL below 70 History of dyslipidemia on statin therapy with lipid profile performed 12/11/2019 revealing total cholesterol of 100, LDL of 38 and HDL 44.  Claudication of left lower extremity, ABI on Lt now  .80 or less down from 1.0 in 01/2013 History of PAD status post IDET stenting of occluded popliteal artery remotely.  Because of  recurrent pain and abnormal Doppler scheduled reintervention by myself 09/05/2015 were with revealing occluded popliteal artery stent.  I placed a Viabahn covered stent.  His last angiogram 09/05/2015 revealed an occluded stent in the popliteal segment extending down to P3.  I did refer him to Dr. Trula Slade for consideration of surgical revascularization but he did not think this would be a long-term durable procedure and therefore he was elected to treat him medically.  He still complains of lifestyle limiting claudication.  I am to begin him on Pletal 50 mg p.o. twice daily for this.  Ischemic cardiomyopathy History of ischemic cardiomyopathy with an EF initially of 25% at the time of cath.  With revascularization and medical therapy his EF has normalized as of his 2D echo performed 10/27/2019.      Lorretta Harp MD FACP,FACC,FAHA, [facility] 11/29/2020 10:51 AM

## 2020-11-29 NOTE — Patient Instructions (Signed)
Medication Instructions:   Start Pletal 50mg  twice daily.  *If you need a refill on your cardiac medications before your next appointment, please call your pharmacy*   Lab Work: Your physician recommends that you return for lab work in: Lipid/liver profile in 1-2 weeks.  If you have labs (blood work) drawn today and your tests are completely normal, you will receive your results only by: Marland Kitchen MyChart Message (if you have MyChart) OR . A paper copy in the mail If you have any lab test that is abnormal or we need to change your treatment, we will call you to review the results.   Follow-Up: At [facility], you and your health needs are our priority.  As part of our continuing mission to provide you with exceptional heart care, we have created designated Provider Care Teams.  These Care Teams include your primary Cardiologist (physician) and Advanced Practice Providers (APPs -  Physician Assistants and Nurse Practitioners) who all work together to provide you with the care you need, when you need it.  We recommend signing up for the patient portal called "MyChart".  Sign up information is provided on this After Visit Summary.  MyChart is used to connect with patients for Virtual Visits (Telemedicine).  Patients are able to view lab/test results, encounter notes, upcoming appointments, etc.  Non-urgent messages can be sent to your provider as well.   To learn more about what you can do with MyChart, go to [email or web address].    Your next appointment:   6 month(s)  The format for your next appointment:   In Person  Provider:   You will see one of the following Advanced Practice Providers on your designated Care Team:    Kerin Ransom, PA-C  [place], [place]  Coletta Memos, [place]  Then, Quay Burow, MD will plan to see you again in 12 month(s).

## 2020-11-29 NOTE — Assessment & Plan Note (Signed)
History of CAD status post cardiac catheterization by Dr. Tamala Julian revealing an occluded RCA with left right collaterals and high-grade proximal LAD disease and obtuse marginal branch disease.  He underwent CABG x2 by Dr. Kipp Brood 07/22/2019 with a LIMA to his LAD, and vein to the PDA.  He denies chest pain or shortness of breath

## 2020-11-29 NOTE — Assessment & Plan Note (Signed)
History of PAD status post IDET stenting of occluded popliteal artery remotely.  Because of recurrent pain and abnormal Doppler scheduled reintervention by myself 09/05/2015 were with revealing occluded popliteal artery stent.  I placed a Viabahn covered stent.  His last angiogram 09/05/2015 revealed an occluded stent in the popliteal segment extending down to P3.  I did refer him to Dr. Trula Slade for consideration of surgical revascularization but he did not think this would be a long-term durable procedure and therefore he was elected to treat him medically.  He still complains of lifestyle limiting claudication.  I am to begin him on Pletal 50 mg p.o. twice daily for this.

## 2020-11-29 NOTE — Assessment & Plan Note (Signed)
History of ischemic cardiomyopathy with an EF initially of 25% at the time of cath.  With revascularization and medical therapy his EF has normalized as of his 2D echo performed 10/27/2019.

## 2020-11-29 NOTE — Assessment & Plan Note (Signed)
History of essential hypertension a blood pressure measured today 145/72.  He is on [employer]

## 2020-12-03 ENCOUNTER — Inpatient Hospital Stay (HOSPITAL_COMMUNITY): Admission: RE | Admit: 2020-12-03 | Payer: Medicare Other | Source: Ambulatory Visit

## 2020-12-06 ENCOUNTER — Other Ambulatory Visit (HOSPITAL_COMMUNITY)
Admission: RE | Admit: 2020-12-06 | Discharge: 2020-12-06 | Disposition: A | Discharge status: Home / Self Care | Payer: Medicare Other | Source: Ambulatory Visit | Attending: Internal Medicine | Admitting: Internal Medicine

## 2020-12-06 DIAGNOSIS — Z20822 Contact with and (suspected) exposure to covid-19: Secondary | ICD-10-CM | POA: Insufficient documentation

## 2020-12-06 DIAGNOSIS — Z01812 Encounter for preprocedural laboratory examination: Secondary | ICD-10-CM | POA: Insufficient documentation

## 2020-12-06 LAB — SARS CORONAVIRUS 2 (TAT 6-24 HRS): SARS Coronavirus 2: NEGATIVE

## 2020-12-07 ENCOUNTER — Other Ambulatory Visit: Payer: Self-pay

## 2020-12-07 ENCOUNTER — Encounter: Payer: Self-pay | Admitting: Internal Medicine

## 2020-12-07 ENCOUNTER — Ambulatory Visit: Payer: Medicare Other | Admitting: Internal Medicine

## 2020-12-07 ENCOUNTER — Ambulatory Visit (INDEPENDENT_AMBULATORY_CARE_PROVIDER_SITE_OTHER): Payer: Medicare Other | Admitting: Internal Medicine

## 2020-12-07 VITALS — BP 116/64 | HR 104 | Temp 97.2°F | Ht 63.0 in | Wt 205.4 lb

## 2020-12-07 DIAGNOSIS — R053 Chronic cough: Secondary | ICD-10-CM

## 2020-12-07 DIAGNOSIS — R0602 Shortness of breath: Secondary | ICD-10-CM | POA: Diagnosis not present

## 2020-12-07 DIAGNOSIS — Z87891 Personal history of nicotine dependence: Secondary | ICD-10-CM | POA: Diagnosis not present

## 2020-12-07 LAB — PULMONARY FUNCTION TEST
DL/VA % pred: 59 %
DL/VA: 2.38 ml/min/mmHg/L
DLCO cor % pred: 55 %
DLCO cor: 10.81 ml/min/mmHg
DLCO unc % pred: 55 %
DLCO unc: 10.81 ml/min/mmHg
FEF 25-75 Post: 2.94 L/sec
FEF 25-75 Pre: 2.5 L/sec
FEF2575-%Change-Post: 17 %
FEF2575-%Pred-Post: 217 %
FEF2575-%Pred-Pre: 184 %
FEV1-%Change-Post: 2 %
FEV1-%Pred-Post: 113 %
FEV1-%Pred-Pre: 109 %
FEV1-Post: 2.29 L
FEV1-Pre: 2.22 L
FEV1FVC-%Change-Post: 0 %
FEV1FVC-%Pred-Pre: 116 %
FEV6-%Change-Post: 3 %
FEV6-%Pred-Post: 101 %
FEV6-%Pred-Pre: 98 %
FEV6-Post: 2.71 L
FEV6-Pre: 2.62 L
FEV6FVC-%Change-Post: 0 %
FEV6FVC-%Pred-Post: 109 %
FEV6FVC-%Pred-Pre: 108 %
FVC-%Change-Post: 2 %
FVC-%Pred-Post: 93 %
FVC-%Pred-Pre: 91 %
FVC-Post: 2.71 L
FVC-Pre: 2.66 L
Post FEV1/FVC ratio: 84 %
Post FEV6/FVC ratio: 100 %
Pre FEV1/FVC ratio: 84 %
Pre FEV6/FVC Ratio: 100 %
RV % pred: 86 %
RV: 1.97 L
TLC % pred: 82 %
TLC: 4.65 L

## 2020-12-07 NOTE — Progress Notes (Signed)
PFT done today. 

## 2020-12-07 NOTE — Progress Notes (Signed)
Philip Hurst    [id number]    January 05, 1941  Primary Care Physician:Elkins, Curt Jews, MD Date of Appointment: 12/07/2020 Established Patient Visit  Chief complaint:   Chief Complaint  Patient presents with  . Follow-up    Pt had PFT today.  Here to review these results.      HPI: Philip Hurst is a 80 y.o. gentleman with CAD s/p PCI and PVD who is here for shortness of breath.   Interval Updates: Prior tobacco use disorder. Has gained about 30 lbs in the last 6 months. Here for follow up today after PFTs.   Denies any shortness of breath currently Having leg pain when he walks. Unclear if this is claudication vs his spinal stenosis. Recently started on pletal for claudication.   Coughing is actually better. Zyrtec has helped and is taking flonase.   I have reviewed the patient's family social and past medical history and updated as appropriate.   Past Medical History:  Diagnosis Date  . Arthritis    "back, joints in right hand" (08/18/2013)  . CAD (coronary artery disease), single vessel disease with collaterals, medical therapy 09/12/2011  . Chronic lower back pain   . Claudication of left lower extremity, ABI Lt 0.52 -lifestyle limiting  ABI rt 0.90 01/30/2013  . Coronary artery disease    OCCLUDED RCA  . Hyperlipidemia   . Hypertension   . Normal cardiac stress test, No ischemia 12/15/12 01/30/2013  . PAD (peripheral artery disease) ([place])     Past Surgical History:  Procedure Laterality Date  . BACK SURGERY    . CARDIAC CATHETERIZATION  @2002    1 vessel disease w/collaterals,medical therapy  . CARDIOVASCULAR STRESS TEST  12/07/2008   EF 58%, NO ISCHEMIA  . CATARACT EXTRACTION W/ INTRAOCULAR LENS  IMPLANT, BILATERAL Bilateral   . CORONARY ARTERY BYPASS GRAFT  07/22/2019   Procedure: OFF PUMP CORONARY ARTERY BYPASS GRAFTING (CABG) times two using left internal mammary artery and right leg saphenous vein;  Surgeon: Lajuana Matte, MD;  Location: [facility]  OR;  Service: Open Heart Surgery;;  . 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 LEFT HEART CATH AND CORONARY ANGIOGRAPHY N/A 07/19/2019   Procedure: LEFT HEART CATH AND CORONARY ANGIOGRAPHY;  Surgeon: Belva Crome, MD;  Location: [facility];  Service: Cardiovascular;  Laterality: N/A;  . lower extremities arterial doppler  02/16/2013   bilateral ABIs normal values w/o evidence arterial insuff. at rest; left  lower extrem. norm. left popliteal artery stent norm. patency  . LOWER EXTREMITY ANGIOGRAM Left 08/18/2013   Procedure: LOWER EXTREMITY ANGIOGRAM;  Surgeon: Lorretta Harp, MD;  Location[facility] C[facility] LAB;  Service: Cardiovascular;  Laterality: Left;  . LUMBAR DISC SURGERY  1990's   "herniated disc" (08/18/2013)  . PERCUTANEOUS STENT INTERVENTION  01/30/2013   Procedure: PERCUTANEOUS STENT INTERVENTION;  Surgeon: Lorretta Harp, MD;  Location: [facility] CATH LAB;  Service: Cardiovascular;;  . PERIPHERAL VASCULAR CATHETERIZATION N/A 09/05/2015   Procedure: Lower Extremity Angiography;  Surgeon: Lorretta Harp, MD;  Location: [facility] CV LAB;  Service: Cardiovascular;  Laterality: N/A;  . POPLITEAL ARTERY STENT Left 01/30/13  . POPLITEAL ARTERY STENT Left 10/'28/2014   successful Viabahn covered stent placement within the previously placed IDEV  stents/notes 08/18/2013  . POSTERIOR LUMBAR FUSION  1990's  . TONSILLECTOMY    . WRIST FUSION Right 1990's?   "first OR didn't correct problem" (08/18/2013)  Family History  Problem Relation Age of Onset  . Seizures Mother   . Cancer Father   . Coronary artery disease Brother   . Diabetes Brother   . Lung cancer Brother   . Lung cancer Sister     Social History   Occupational History  . Not on file  Tobacco Use  . Smoking status: Former Smoker    Packs/day: 1.00    Years: 50.00    Pack years: 50.00    Types: Cigarettes    Quit date: 08/26/1997    Years since quitting: 23.2  . Smokeless tobacco: Former  Systems developer    Types: Chew  Substance and Sexual Activity  . Alcohol use: Yes    Comment: 08/18/2013 "glass of wine 5-6 months ago; very rare"  . Drug use: No  . Sexual activity: Never     Physical Exam: Blood pressure 116/64, pulse (!) 104, temperature (!) 97.2 F (36.2 C), temperature source Tympanic, height 5\' 3"  (1.6 m), weight 205 lb 6 oz (93.2 kg), SpO2 99 %.  Gen:      No acute distress Lungs:    No increased respiratory effort, symmetric chest wall excursion, clear to auscultation bilaterally, no wheezes or crackles CV:         Regular rate and rhythm; no murmurs, rubs, or gallops.  No pedal edema   Data Reviewed: Imaging: I have personally reviewed the chest xray Dec 2021 which shows some low lung volumes, peribronchial cuffing. No effusion, pna.   PFTs:  PFT Results Latest Ref Rng & Units 12/07/2020 07/21/2019  FVC-Pre L 2.66 1.30  FVC-Predicted Pre % 91 43  FVC-Post L 2.71 -  FVC-Predicted Post % 93 -  Pre FEV1/FVC % % 84 77  Post FEV1/FCV % % 84 -  FEV1-Pre L 2.22 1.00  FEV1-Predicted Pre % 109 47  FEV1-Post L 2.29 -  DLCO uncorrected ml/min/mmHg 10.81 -  DLCO UNC% % 55 -  DLCO corrected ml/min/mmHg 10.81 -  DLCO COR %Predicted % 55 -  DLVA Predicted % 59 -  TLC L 4.65 -  TLC % Predicted % 82 -  RV % Predicted % 86 -   I have personally reviewed the patient's PFTs and lung volumes and spirometry are normal. Diffusion capacity moderately reduced.   Labs:  Immunization status: Immunization History  Administered Date(s) Administered  . Influenza, High Dose Seasonal PF 06/22/2020  . Influenza-Unspecified 09/02/2006    Assessment:  Shortness of breath CAD s/p CABG and PCI PVD Chronic Cough  Plan/Recommendations:  [name] currently denies any symptoms of shortness of breath.  His PFTs are reassuring. His chest xray is fairy unremarkable, maybe some chronic bronchitis changes, but he appears fairly asymptomatic from this perspective.  His symptoms of cough  are likely secondary to rhinitis - improved with cetirizine and fluticasone intra-nasal. Continue these for now.    Return to Care: Return in about 6 months (around 06/06/2021).   Philip Llamas, MD Pulmonary and [place]

## 2020-12-07 NOTE — Patient Instructions (Addendum)
The patient should have follow up scheduled with myself in 6 months.   Your cough is better! Keep taking zyrtec and flonase.   Your lung function is normal based on breathing testing.  Your chest xray shows some chronic (old) changes related most likely to previous smoking. But I don't see any spots or masses on your lungs that are concerning enough to need any follow up.

## 2020-12-09 ENCOUNTER — Telehealth: Payer: Self-pay

## 2020-12-09 NOTE — Telephone Encounter (Signed)
   Primary Cardiologist: Quay Burow, MD  Chart reviewed as part of pre-operative protocol coverage. Given past medical history and time since last visit, based on ACC/AHA guidelines, Philip Hurst would be at acceptable risk for the planned procedure without further cardiovascular testing.   He was last seen 11/29/20 by Dr. Gwenlyn Found doing well from a cardiac perspective.   I will route this recommendation to the requesting party via Epic fax function and remove from pre-op pool.  Please call with questions.  Loel Dubonnet, NP 12/09/2020, 5:01 PM

## 2020-12-09 NOTE — Progress Notes (Signed)
Surgical Instructions    Your procedure is scheduled on Wednesday, December 14, 2020.  Report to [facility] Main Entrance "A" at 9:00 A.M., then check in with the Admitting office.  Call this number if you have problems the morning of surgery:  [phone]   If you have any questions prior to your surgery date call [phone]: Open Monday-Friday 8am-4pm   Remember:  Do not eat after midnight the night before your surgery  You may drink clear liquids until 9:00 am the morning of your surgery.   Clear liquids allowed are: Water, Non-Citrus Juices (without pulp), Carbonated Beverages, Clear Tea, Black Coffee Only, and Gatorade   Enhanced Recovery after Surgery for Orthopedics Enhanced Recovery after Surgery is a protocol used to improve the stress on your body and your recovery after surgery.  Patient Instructions  . The night before surgery:  o No food after midnight. ONLY clear liquids after midnight   . The day of surgery (if you do NOT have diabetes):  o Drink ONE (1) Pre-Surgery Clear Ensure by 9:00 am the morning of surgery   o This drink was given to you during your hospital  pre-op appointment visit. o Nothing else to drink after completing the  Pre-Surgery Clear Ensure.         If you have questions, please contact your surgeon's office.    Take these medicines the morning of surgery with A SIP OF WATER : Acetaminophen (Tylenol) Cilostazol (Pletal) Fluticasone (Flonase) Tamsulosin (Flomax)  Follow your surgeon's instructions on when to stop Aspirin.  If no instructions were given by your surgeon then you will need to call the office to get those instructions.    As of today, STOP taking any Aspirin (unless otherwise instructed by your surgeon) Aleve, Naproxen, Ibuprofen, Motrin, Advil, Goody's, BC's, all herbal medications, fish oil, and all vitamins.                     Do not wear jewelry.            Do not wear lotions, powders, perfumes/colognes, or  deodorant.            Do not shave 48 hours prior to surgery.  Men may shave face and neck.            Do not bring valuables to the hospital.            [facility] is not responsible for any belongings or valuables.  Do NOT Smoke (Tobacco/Vaping) or drink Alcohol 24 hours prior to your procedure If you use a CPAP at night, you may bring all equipment for your overnight stay.   Contacts, glasses, dentures or bridgework may not be worn into surgery, please bring cases for these belongings   For patients admitted to the hospital, discharge time will be determined by your treatment team.   Patients discharged the day of surgery will not be allowed to drive home, and someone needs to stay with them for 24 hours.    Special instructions:   - Preparing For Surgery  Before surgery, you can play an important role. Because skin is not sterile, your skin needs to be as free of germs as possible. You can reduce the number of germs on your skin by washing with CHG (chlorahexidine gluconate) Soap before surgery.  CHG is an antiseptic cleaner which kills germs and bonds with the skin to continue killing germs even after washing.    Oral Hygiene  is also important to reduce your risk of infection.  Remember - BRUSH YOUR TEETH THE MORNING OF SURGERY WITH YOUR REGULAR TOOTHPASTE  Please do not use if you have an allergy to CHG or antibacterial soaps. If your skin becomes reddened/irritated stop using the CHG.  Do not shave (including legs and underarms) for at least 48 hours prior to first CHG shower. It is OK to shave your face.  Please follow these instructions carefully.   1. Shower the NIGHT BEFORE SURGERY and the MORNING OF SURGERY  2. If you chose to wash your hair, wash your hair first as usual with your normal shampoo.  3. After you shampoo, rinse your hair and body thoroughly to remove the shampoo.  4. Wash Face and genitals (private parts) with your normal soap.   5.  Shower  the NIGHT BEFORE SURGERY and the MORNING OF SURGERY with CHG Soap.   6. Use CHG Soap as you would any other liquid soap. You can apply CHG directly to the skin and wash gently with a scrungie or a clean washcloth.   7. Apply the CHG Soap to your body ONLY FROM THE NECK DOWN.  Do not use on open wounds or open sores. Avoid contact with your eyes, ears, mouth and genitals (private parts). Wash Face and genitals (private parts)  with your normal soap.   8. Wash thoroughly, paying special attention to the area where your surgery will be performed.  9. Thoroughly rinse your body with warm water from the neck down.  10. DO NOT shower/wash with your normal soap after using and rinsing off the CHG Soap.  11. Pat yourself dry with a CLEAN TOWEL.  12. Wear CLEAN PAJAMAS to bed the night before surgery  13. Place CLEAN SHEETS on your bed the night before your surgery  14. DO NOT SLEEP WITH PETS.   Day of Surgery: Wear Clean/Comfortable clothing the morning of surgery Do not apply any deodorants/lotions.   Remember to brush your teeth WITH YOUR REGULAR TOOTHPASTE.   Please read over the following fact sheets that you were given.

## 2020-12-09 NOTE — Telephone Encounter (Signed)
   [facility] Pre-operative Risk Assessment    HEARTCARE STAFF: - Please ensure there is not already an duplicate clearance open for this procedure. - Under Visit Info/Reason for Call, type in Other and utilize the format Clearance MM/DD/YY or Clearance TBD. Do not use dashes or single digits. - If request is for dental extraction, please clarify the # of teeth to be extracted.  Request for surgical clearance:  1. What type of surgery is being performed? Day 1 lateral interbody fusion lumbar 2-3 with instrumentation and allograph; Day 2 lumbar 2-3 posterior spinal fusion with instrumentation and allograph.  2. When is this surgery scheduled? 12/14/20 and 12/15/20  3. What type of clearance is required (medical clearance vs. Pharmacy clearance to hold med vs. Both)? BOTH  4. Are there any medications that need to be held prior to surgery and how long? aspirin   5. Practice name and name of physician performing surgery? [facility] - Phylliss Bob, MD  6. What is the office phone number? [phone]   7.   What is the office fax number? [phone]  8.   Anesthesia type (None, local, MAC, general) ? Unknown    Ena Dawley 12/09/2020, 4:25 PM  _________________________________________________________________   (provider comments below)

## 2020-12-12 ENCOUNTER — Encounter (HOSPITAL_COMMUNITY)
Admission: RE | Admit: 2020-12-12 | Discharge: 2020-12-12 | Disposition: A | Discharge status: Home / Self Care | Payer: Medicare Other | Source: Ambulatory Visit | Attending: Orthopedic Surgery | Admitting: Orthopedic Surgery

## 2020-12-12 ENCOUNTER — Other Ambulatory Visit: Payer: Self-pay

## 2020-12-12 ENCOUNTER — Other Ambulatory Visit (HOSPITAL_COMMUNITY)
Admission: RE | Admit: 2020-12-12 | Discharge: 2020-12-12 | Disposition: A | Discharge status: Home / Self Care | Payer: Medicare Other | Source: Ambulatory Visit | Attending: Orthopedic Surgery | Admitting: Orthopedic Surgery

## 2020-12-12 ENCOUNTER — Encounter (HOSPITAL_COMMUNITY): Payer: Self-pay

## 2020-12-12 DIAGNOSIS — Z20822 Contact with and (suspected) exposure to covid-19: Secondary | ICD-10-CM | POA: Insufficient documentation

## 2020-12-12 DIAGNOSIS — Z01812 Encounter for preprocedural laboratory examination: Secondary | ICD-10-CM | POA: Insufficient documentation

## 2020-12-12 HISTORY — DX: Acute myocardial infarction, unspecified: I21.9

## 2020-12-12 LAB — CBC WITH DIFFERENTIAL/PLATELET
Abs Immature Granulocytes: 0.02 10*3/uL (ref 0.00–0.07)
Basophils Absolute: 0 10*3/uL (ref 0.0–0.1)
Basophils Relative: 0 %
Eosinophils Absolute: 0.4 10*3/uL (ref 0.0–0.5)
Eosinophils Relative: 4 %
HCT: 35.7 % — ABNORMAL LOW (ref 39.0–52.0)
Hemoglobin: 12 g/dL — ABNORMAL LOW (ref 13.0–17.0)
Immature Granulocytes: 0 %
Lymphocytes Relative: 16 %
Lymphs Abs: 1.5 10*3/uL (ref 0.7–4.0)
MCH: 32.3 pg (ref 26.0–34.0)
MCHC: 33.6 g/dL (ref 30.0–36.0)
MCV: 96 fL (ref 80.0–100.0)
Monocytes Absolute: 0.7 10*3/uL (ref 0.1–1.0)
Monocytes Relative: 7 %
Neutro Abs: 6.7 10*3/uL (ref 1.7–7.7)
Neutrophils Relative %: 73 %
Platelets: 229 10*3/uL (ref 150–400)
RBC: 3.72 MIL/uL — ABNORMAL LOW (ref 4.22–5.81)
RDW: 13.4 % (ref 11.5–15.5)
WBC: 9.2 10*3/uL (ref 4.0–10.5)
nRBC: 0 % (ref 0.0–0.2)

## 2020-12-12 LAB — COMPREHENSIVE METABOLIC PANEL
ALT: 23 U/L (ref 0–44)
AST: 26 U/L (ref 15–41)
Albumin: 3.9 g/dL (ref 3.5–5.0)
Alkaline Phosphatase: 87 U/L (ref 38–126)
Anion gap: 11 (ref 5–15)
BUN: 34 mg/dL — ABNORMAL HIGH (ref 8–23)
CO2: 19 mmol/L — ABNORMAL LOW (ref 22–32)
Calcium: 9.4 mg/dL (ref 8.9–10.3)
Chloride: 108 mmol/L (ref 98–111)
Creatinine, Ser: 1.8 mg/dL — ABNORMAL HIGH (ref 0.61–1.24)
GFR, Estimated: 38 mL/min — ABNORMAL LOW (ref >60)
Glucose, Bld: 155 mg/dL — ABNORMAL HIGH (ref 70–99)
Potassium: 4.5 mmol/L (ref 3.5–5.1)
Sodium: 138 mmol/L (ref 135–145)
Total Bilirubin: 0.2 mg/dL — ABNORMAL LOW (ref 0.3–1.2)
Total Protein: 7 g/dL (ref 6.5–8.1)

## 2020-12-12 LAB — URINALYSIS, ROUTINE W REFLEX MICROSCOPIC
Bilirubin Urine: NEGATIVE
Glucose, UA: NEGATIVE mg/dL
Hgb urine dipstick: NEGATIVE
Ketones, ur: NEGATIVE mg/dL
Leukocytes,Ua: NEGATIVE
Nitrite: NEGATIVE
Protein, ur: 30 mg/dL — AB
Specific Gravity, Urine: 1.024 (ref 1.005–1.030)
pH: 5 (ref 5.0–8.0)

## 2020-12-12 LAB — APTT: aPTT: 30 seconds (ref 24–36)

## 2020-12-12 LAB — TYPE AND SCREEN
ABO/RH(D): B POS
Antibody Screen: NEGATIVE

## 2020-12-12 LAB — SURGICAL PCR SCREEN
MRSA, PCR: NEGATIVE
Staphylococcus aureus: NEGATIVE

## 2020-12-12 LAB — PROTIME-INR
INR: 1 (ref 0.8–1.2)
Prothrombin Time: 13.2 seconds (ref 11.4–15.2)

## 2020-12-12 LAB — SARS CORONAVIRUS 2 (TAT 6-24 HRS): SARS Coronavirus 2: NEGATIVE

## 2020-12-12 NOTE — Progress Notes (Signed)
PCP - Dr. Gwyneth Revels  Cardiologist - Dr. Adora Fridge  Chest x-ray - 10/09/20 (E)  EKG - 11/29/20 (E)  Stress Test - 11/25/12 (E)  ECHO - 10/27/19 (E)  Cardiac Cath - 07/19/2019 (E)  AICD-na PM-na LOOP-na  Sleep Study - Denies CPAP - Denies  LABS- 12/12/20: CBCw/D, CMP, PT, PTT, T/S, UA, PCR, COVID  ASA- LD- 2/20  ERAS- Yes- Ensure given x1  HA1C- Denies  Anesthesia- Yes- cardiac history  Pt denies having chest pain, sob, or fever at this time. All instructions explained to the pt, with a verbal understanding of the material. Pt agrees to go over the instructions while at home for a better understanding. Pt also instructed to self quarantine after being tested for COVID-19. The opportunity to ask questions was provided.   Coronavirus Screening  Have you experienced the following symptoms:  Cough yes/no: No Fever (>100.77F)  yes/no: No Runny nose yes/no: No Sore throat yes/no: No Difficulty breathing/shortness of breath  yes/no: No  Have you or a family member traveled in the last 14 days and where? yes/no: No   If the patient indicates "YES" to the above questions, their PAT will be rescheduled to limit the exposure to others and, the surgeon will be notified. THE PATIENT WILL NEED TO BE ASYMPTOMATIC FOR 14 DAYS.   If the patient is not experiencing any of these symptoms, the PAT nurse will instruct them to NOT bring anyone with them to their appointment since they may have these symptoms or traveled as well.   Please remind your patients and families that hospital visitation restrictions are in effect and the importance of the restrictions.

## 2020-12-13 NOTE — Anesthesia Preprocedure Evaluation (Addendum)
Anesthesia Evaluation  Patient identified by MRN, date of birth, ID band Patient awake    Reviewed: Allergy & Precautions, H&P , NPO status , Patient's Chart, lab work & pertinent test results  Airway Mallampati: II  TM Distance: >3 FB Neck ROM: Full    Dental no notable dental hx.    Pulmonary neg pulmonary ROS, former smoker,    Pulmonary exam normal breath sounds clear to auscultation       Cardiovascular hypertension, + CAD, + Past MI, + CABG and + Peripheral Vascular Disease  Normal cardiovascular exam Rhythm:Regular Rate:Normal  Normal EF post bypass surgery   Neuro/Psych negative neurological ROS  negative psych ROS   GI/Hepatic negative GI ROS, Neg liver ROS,   Endo/Other  negative endocrine ROS  Renal/GU Renal InsufficiencyRenal disease  negative genitourinary   Musculoskeletal negative musculoskeletal ROS (+)   Abdominal   Peds negative pediatric ROS (+)  Hematology negative hematology ROS (+)   Anesthesia Other Findings   Reproductive/Obstetrics negative OB ROS                            Anesthesia Physical Anesthesia Plan  ASA: III  Anesthesia Plan: General   Post-op Pain Management:    Induction: Intravenous  PONV Risk Score and Plan: 2 and Ondansetron, Dexamethasone and Treatment may vary due to age or medical condition  Airway Management Planned: Oral ETT  Additional Equipment:   Intra-op Plan:   Post-operative Plan: Extubation in OR  Informed Consent: I have reviewed the patients History and Physical, chart, labs and discussed the procedure including the risks, benefits and alternatives for the proposed anesthesia with the patient or authorized representative who has indicated his/her understanding and acceptance.     Dental advisory given  Plan Discussed with: CRNA and Surgeon  Anesthesia Plan Comments: (PAT note by Karoline Caldwell, PA-C: Follows with  cardiology for hx of CAD status post cardiac catheterization by Dr. Tamala Julian revealing an occluded RCA with left right collaterals and high-grade proximal LAD disease and obtuse marginal branch disease.  He underwent CABG x2 by Dr. Kipp Brood 07/22/2019 with a LIMA to his LAD, and vein to the PDA. He had ischemic cardiomyopathy with an EF initially of 25% at the time of cath.  With revascularization and medical therapy his EF has normalized as of his 2D echo performed 10/27/2019.  He is also followed for history of PAD, known occluded stents of left popliteal artery.  Seen by vascular and surgical revascularization was felt not likely to be a long-term durable solution and therefore it was elected to treat him medically.  Cardiac clearance per telephone encounter 12/09/2020, "Chart reviewed as part of pre-operative protocol coverage. Given past medical history and time since last visit, based on ACC/AHA guidelines, FLEET HIGHAM would be at acceptable risk for the planned procedure without further cardiovascular testing. He was last seen 11/29/20 by Dr. Gwenlyn Found doing well from a cardiac perspective."  Recent pulmonary evaluation of shortness of breath.  Last seen by Dr. Shearon Stalls 12/07/2020 and at that time symptoms had improved.  Per note, "Mr. Dippolito currently denies any symptoms of shortness of breath.  His PFTs are reassuring. His chest xray is fairy unremarkable, maybe some chronic bronchitis changes, but he appears fairly asymptomatic from this perspective. His symptoms of cough are likely secondary to rhinitis - improved with cetirizine and fluticasone intra-nasal. Continue these for now."  Preop labs reviewed, creatinine elevated at 1.80, remainder of  labs unremarkable.  Review of records shows history of renal insufficiency, recent creatinine baseline to be around 1.30.  History of AKI September 2020 in the setting of sepsis secondary to pneumonia with creatinine peaking at 4.89.  Will repeat labs on day of surgery to  follow trend.  CHEST - 2 VIEW 10/07/2020. COMPARISON:  Radiograph 07/08/2020, CT 07/18/2019  FINDINGS: Chronic airways thickening, peribronchovascular opacity and likely bronchiectatic changes with architectural distortion in the mid to lower lungs in a perihilar distribution. Overall extent of changes is quite similar to the comparison radiograph in September. No new consolidative opacity. No pneumothorax or effusion. Postsurgical changes related to prior CABG including intact and aligned sternotomy wires and multiple surgical clips projecting over the mediastinum. Cardiac size is top normal. Remaining cardiomediastinal contours are unremarkable. No acute osseous or soft tissue abnormality.  IMPRESSION: Chronic airways thickening, bronchiectatic changes and peribronchovascular opacity in the mid to lower lungs, similar to the comparison radiograph in September. Could reflect some post infectious/inflammatory change though certainly superimposed acute infection or chronic smoldering atypical infection or sequela of aspiration is not excluded.   TTE 10/27/19: 1. Left ventricular ejection fraction, by visual estimation, is 55 to  60%. The left ventricle has normal function. There is moderately increased  left ventricular hypertrophy.  2. Left ventricular diastolic parameters are consistent with Grade I  diastolic dysfunction (impaired relaxation).  3. The left ventricle has no regional wall motion abnormalities.  4. Global right ventricle has normal systolic function.The right  ventricular size is normal. No increase in right ventricular wall  thickness.  5. Left atrial size was normal.  6. Right atrial size was normal.  7. The mitral valve is normal in structure. Mild mitral valve  regurgitation. No evidence of mitral stenosis.  8. The tricuspid valve is normal in structure.  9. The aortic valve is normal in structure. Aortic valve regurgitation is  not visualized.  Mild to moderate aortic valve sclerosis/calcification  without any evidence of aortic stenosis.  10. The pulmonic valve was normal in structure. Pulmonic valve  regurgitation is trivial.  11. The inferior vena cava is normal in size with greater than 50%  respiratory variability, suggesting right atrial pressure of 3 mmHg. )       Anesthesia Quick Evaluation

## 2020-12-13 NOTE — Progress Notes (Signed)
Anesthesia Chart Review:  Follows with cardiology for hx of CAD status post cardiac catheterization by Dr. Tamala Julian revealing an occluded RCA with left right collaterals and high-grade proximal LAD disease and obtuse marginal branch disease.  He underwent CABG x2 by Dr. Kipp Brood 07/22/2019 with a LIMA to his LAD, and vein to the PDA. He had ischemic cardiomyopathy with an EF initially of 25% at the time of cath.  With revascularization and medical therapy his EF has normalized as of his 2D echo performed 10/27/2019.  He is also followed for history of PAD, known occluded stents of left popliteal artery.  Seen by vascular and surgical revascularization was felt not likely to be a long-term durable solution and therefore it was elected to treat him medically.  Cardiac clearance per telephone encounter 12/09/2020, "Chart reviewed as part of pre-operative protocol coverage. Given past medical history and time since last visit, based on ACC/AHA guidelines, BROADY LAFOY would be at acceptable risk for the planned procedure without further cardiovascular testing. He was last seen 11/29/20 by Dr. Gwenlyn Found doing well from a cardiac perspective."  Recent pulmonary evaluation of shortness of breath.  Last seen by Dr. Shearon Stalls 12/07/2020 and at that time symptoms had improved.  Per note, "Mr. Boettger currently denies any symptoms of shortness of breath.  His PFTs are reassuring. His chest xray is fairy unremarkable, maybe some chronic bronchitis changes, but he appears fairly asymptomatic from this perspective. His symptoms of cough are likely secondary to rhinitis - improved with cetirizine and fluticasone intra-nasal. Continue these for now."  Preop labs reviewed, creatinine elevated at 1.80, remainder of labs unremarkable.  Review of records shows history of renal insufficiency, recent creatinine baseline to be around 1.30.  History of AKI September 2020 in the setting of sepsis secondary to pneumonia with creatinine peaking at  4.89.  Will repeat labs on day of surgery to follow trend.  CHEST - 2 VIEW 10/07/2020. COMPARISON:  Radiograph 07/08/2020, CT 07/18/2019  FINDINGS: Chronic airways thickening, peribronchovascular opacity and likely bronchiectatic changes with architectural distortion in the mid to lower lungs in a perihilar distribution. Overall extent of changes is quite similar to the comparison radiograph in September. No new consolidative opacity. No pneumothorax or effusion. Postsurgical changes related to prior CABG including intact and aligned sternotomy wires and multiple surgical clips projecting over the mediastinum. Cardiac size is top normal. Remaining cardiomediastinal contours are unremarkable. No acute osseous or soft tissue abnormality.  IMPRESSION: Chronic airways thickening, bronchiectatic changes and peribronchovascular opacity in the mid to lower lungs, similar to the comparison radiograph in September. Could reflect some post infectious/inflammatory change though certainly superimposed acute infection or chronic smoldering atypical infection or sequela of aspiration is not excluded.   TTE 10/27/19: 1. Left ventricular ejection fraction, by visual estimation, is 55 to  60%. The left ventricle has normal function. There is moderately increased  left ventricular hypertrophy.  2. Left ventricular diastolic parameters are consistent with Grade I  diastolic dysfunction (impaired relaxation).  3. The left ventricle has no regional wall motion abnormalities.  4. Global right ventricle has normal systolic function.The right  ventricular size is normal. No increase in right ventricular wall  thickness.  5. Left atrial size was normal.  6. Right atrial size was normal.  7. The mitral valve is normal in structure. Mild mitral valve  regurgitation. No evidence of mitral stenosis.  8. The tricuspid valve is normal in structure.  9. The aortic valve is normal in structure.  Aortic  valve regurgitation is  not visualized. Mild to moderate aortic valve sclerosis/calcification  without any evidence of aortic stenosis.  10. The pulmonic valve was normal in structure. Pulmonic valve  regurgitation is trivial.  11. The inferior vena cava is normal in size with greater than 50%  respiratory variability, suggesting right atrial pressure of 3 mmHg.    Wynonia Musty [facility] Short Stay Center/Anesthesiology Phone [phone] 12/13/2020 10:40 AM

## 2020-12-14 ENCOUNTER — Inpatient Hospital Stay (HOSPITAL_COMMUNITY): Payer: Medicare Other

## 2020-12-14 ENCOUNTER — Inpatient Hospital Stay (HOSPITAL_COMMUNITY)
Admission: RE | Admit: 2020-12-14 | Discharge: 2020-12-16 | DRG: 455 | Disposition: A | Discharge status: Home / Self Care | Payer: Medicare Other | Attending: Orthopedic Surgery | Admitting: Orthopedic Surgery

## 2020-12-14 ENCOUNTER — Inpatient Hospital Stay (HOSPITAL_COMMUNITY): Payer: Medicare Other | Admitting: Physician Assistant

## 2020-12-14 ENCOUNTER — Encounter (HOSPITAL_COMMUNITY): Payer: Self-pay | Admitting: Orthopedic Surgery

## 2020-12-14 ENCOUNTER — Encounter (HOSPITAL_COMMUNITY)
Admission: RE | Disposition: A | Discharge status: Home / Self Care | Payer: Self-pay | Source: Home / Self Care | Attending: Orthopedic Surgery

## 2020-12-14 ENCOUNTER — Other Ambulatory Visit: Payer: Self-pay

## 2020-12-14 DIAGNOSIS — Z8249 Family history of ischemic heart disease and other diseases of the circulatory system: Secondary | ICD-10-CM

## 2020-12-14 DIAGNOSIS — Z95820 Peripheral vascular angioplasty status with implants and grafts: Secondary | ICD-10-CM | POA: Diagnosis not present

## 2020-12-14 DIAGNOSIS — I2510 Atherosclerotic heart disease of native coronary artery without angina pectoris: Secondary | ICD-10-CM | POA: Diagnosis present

## 2020-12-14 DIAGNOSIS — I10 Essential (primary) hypertension: Secondary | ICD-10-CM | POA: Diagnosis present

## 2020-12-14 DIAGNOSIS — Z20822 Contact with and (suspected) exposure to covid-19: Secondary | ICD-10-CM | POA: Diagnosis present

## 2020-12-14 DIAGNOSIS — I252 Old myocardial infarction: Secondary | ICD-10-CM

## 2020-12-14 DIAGNOSIS — Z955 Presence of coronary angioplasty implant and graft: Secondary | ICD-10-CM

## 2020-12-14 DIAGNOSIS — Z820 Family history of epilepsy and other diseases of the nervous system: Secondary | ICD-10-CM

## 2020-12-14 DIAGNOSIS — Z801 Family history of malignant neoplasm of trachea, bronchus and lung: Secondary | ICD-10-CM | POA: Diagnosis not present

## 2020-12-14 DIAGNOSIS — Z7982 Long term (current) use of aspirin: Secondary | ICD-10-CM

## 2020-12-14 DIAGNOSIS — Z87891 Personal history of nicotine dependence: Secondary | ICD-10-CM

## 2020-12-14 DIAGNOSIS — E785 Hyperlipidemia, unspecified: Secondary | ICD-10-CM | POA: Diagnosis present

## 2020-12-14 DIAGNOSIS — I70202 Unspecified atherosclerosis of native arteries of extremities, left leg: Secondary | ICD-10-CM | POA: Diagnosis present

## 2020-12-14 DIAGNOSIS — Z833 Family history of diabetes mellitus: Secondary | ICD-10-CM

## 2020-12-14 DIAGNOSIS — Z419 Encounter for procedure for purposes other than remedying health state, unspecified: Secondary | ICD-10-CM

## 2020-12-14 DIAGNOSIS — Z981 Arthrodesis status: Secondary | ICD-10-CM

## 2020-12-14 DIAGNOSIS — M48061 Spinal stenosis, lumbar region without neurogenic claudication: Secondary | ICD-10-CM | POA: Diagnosis present

## 2020-12-14 DIAGNOSIS — M5410 Radiculopathy, site unspecified: Secondary | ICD-10-CM | POA: Diagnosis present

## 2020-12-14 DIAGNOSIS — Z79899 Other long term (current) drug therapy: Secondary | ICD-10-CM

## 2020-12-14 HISTORY — PX: ANTERIOR LAT LUMBAR FUSION: SHX1168

## 2020-12-14 LAB — BASIC METABOLIC PANEL
Anion gap: 11 (ref 5–15)
BUN: 35 mg/dL — ABNORMAL HIGH (ref 8–23)
CO2: 20 mmol/L — ABNORMAL LOW (ref 22–32)
Calcium: 8.7 mg/dL — ABNORMAL LOW (ref 8.9–10.3)
Chloride: 106 mmol/L (ref 98–111)
Creatinine, Ser: 1.7 mg/dL — ABNORMAL HIGH (ref 0.61–1.24)
GFR, Estimated: 40 mL/min — ABNORMAL LOW (ref >60)
Glucose, Bld: 179 mg/dL — ABNORMAL HIGH (ref 70–99)
Potassium: 4 mmol/L (ref 3.5–5.1)
Sodium: 137 mmol/L (ref 135–145)

## 2020-12-14 SURGERY — ANTERIOR LATERAL LUMBAR FUSION 1 LEVEL
Anesthesia: General | Laterality: Right

## 2020-12-14 MED ORDER — CHLORHEXIDINE GLUCONATE 0.12 % MT SOLN
15.0000 mL | Freq: Once | OROMUCOSAL | Status: AC
  Administered 2020-12-14: 15 mL via OROMUCOSAL
  Filled 2020-12-14: qty 15

## 2020-12-14 MED ORDER — TAMSULOSIN HCL 0.4 MG PO CAPS
0.8000 mg | ORAL_CAPSULE | Freq: Every day | ORAL | Status: DC
  Administered 2020-12-14 – 2020-12-16 (×3): 0.8 mg via ORAL
  Filled 2020-12-14 (×3): qty 2

## 2020-12-14 MED ORDER — CEFAZOLIN SODIUM-DEXTROSE 2-4 GM/100ML-% IV SOLN
2.0000 g | INTRAVENOUS | Status: AC
  Administered 2020-12-15: 2 g via INTRAVENOUS

## 2020-12-14 MED ORDER — 0.9 % SODIUM CHLORIDE (POUR BTL) OPTIME
TOPICAL | Status: DC | PRN
  Administered 2020-12-14: 1000 mL

## 2020-12-14 MED ORDER — LIDOCAINE HCL (PF) 2 % IJ SOLN
INTRAMUSCULAR | Status: DC | PRN
  Administered 2020-12-14: 1.5 mg/kg/h via INTRADERMAL

## 2020-12-14 MED ORDER — CEFAZOLIN SODIUM-DEXTROSE 2-4 GM/100ML-% IV SOLN
2.0000 g | INTRAVENOUS | Status: AC
  Administered 2020-12-14: 2 g via INTRAVENOUS
  Filled 2020-12-14: qty 100

## 2020-12-14 MED ORDER — LIDOCAINE 2% (20 MG/ML) 5 ML SYRINGE
INTRAMUSCULAR | Status: AC
  Filled 2020-12-14: qty 5

## 2020-12-14 MED ORDER — CEFAZOLIN SODIUM-DEXTROSE 2-4 GM/100ML-% IV SOLN
2.0000 g | Freq: Three times a day (TID) | INTRAVENOUS | Status: AC
Start: 2020-12-14 — End: 2020-12-15
  Administered 2020-12-14 – 2020-12-15 (×2): 2 g via INTRAVENOUS
  Filled 2020-12-14 (×2): qty 100

## 2020-12-14 MED ORDER — FLEET ENEMA 7-19 GM/118ML RE ENEM: 1.0000 | ENEMA | Freq: Once | RECTAL | Status: DC | PRN

## 2020-12-14 MED ORDER — ONDANSETRON HCL 4 MG PO TABS: 4.0000 mg | ORAL_TABLET | Freq: Four times a day (QID) | ORAL | Status: DC | PRN

## 2020-12-14 MED ORDER — OXYCODONE HCL 5 MG/5ML PO SOLN: 5.0000 mg | Freq: Once | ORAL | Status: DC | PRN

## 2020-12-14 MED ORDER — KETOCONAZOLE 2 % EX SHAM
1.0000 "application " | MEDICATED_SHAMPOO | CUTANEOUS | Status: DC
  Filled 2020-12-14: qty 120

## 2020-12-14 MED ORDER — ACETAMINOPHEN 650 MG RE SUPP: 650.0000 mg | RECTAL | Status: DC | PRN

## 2020-12-14 MED ORDER — LIDOCAINE HCL (PF) 2 % IJ SOLN
INTRAMUSCULAR | Status: DC | PRN
  Administered 2020-12-14: 100 mg via INTRADERMAL

## 2020-12-14 MED ORDER — METHOCARBAMOL 1000 MG/10ML IJ SOLN
500.0000 mg | Freq: Four times a day (QID) | INTRAVENOUS | Status: DC | PRN
  Filled 2020-12-14 (×3): qty 5

## 2020-12-14 MED ORDER — DOCUSATE SODIUM 100 MG PO CAPS
100.0000 mg | ORAL_CAPSULE | Freq: Two times a day (BID) | ORAL | Status: DC
  Administered 2020-12-14 – 2020-12-16 (×3): 100 mg via ORAL
  Filled 2020-12-14 (×3): qty 1

## 2020-12-14 MED ORDER — ACETAMINOPHEN 500 MG PO TABS
1000.0000 mg | ORAL_TABLET | Freq: Two times a day (BID) | ORAL | Status: DC | PRN
  Administered 2020-12-15 – 2020-12-16 (×2): 1000 mg via ORAL
  Filled 2020-12-14 (×2): qty 2

## 2020-12-14 MED ORDER — BUPIVACAINE-EPINEPHRINE 0.25% -1:200000 IJ SOLN
INTRAMUSCULAR | Status: DC | PRN
  Administered 2020-12-14: 9 mL

## 2020-12-14 MED ORDER — FENTANYL CITRATE (PF) 250 MCG/5ML IJ SOLN
INTRAMUSCULAR | Status: AC
  Filled 2020-12-14: qty 5

## 2020-12-14 MED ORDER — PHENYLEPHRINE HCL-NACL 10-0.9 MG/250ML-% IV SOLN
INTRAVENOUS | Status: DC | PRN
  Administered 2020-12-14: 30 ug/min via INTRAVENOUS

## 2020-12-14 MED ORDER — ACETAMINOPHEN 10 MG/ML IV SOLN
1000.0000 mg | Freq: Once | INTRAVENOUS | Status: DC | PRN
  Administered 2020-12-14: 1000 mg via INTRAVENOUS

## 2020-12-14 MED ORDER — SODIUM CHLORIDE 0.9% FLUSH: 3.0000 mL | INTRAVENOUS | Status: DC | PRN

## 2020-12-14 MED ORDER — SODIUM CHLORIDE 0.9 % IV SOLN
250.0000 mL | INTRAVENOUS | Status: DC
  Administered 2020-12-14: 250 mL via INTRAVENOUS

## 2020-12-14 MED ORDER — THROMBIN 20000 UNITS EX SOLR
CUTANEOUS | Status: AC
  Filled 2020-12-14: qty 20000

## 2020-12-14 MED ORDER — SODIUM CHLORIDE 0.9% FLUSH
3.0000 mL | Freq: Two times a day (BID) | INTRAVENOUS | Status: DC
  Administered 2020-12-16: 3 mL via INTRAVENOUS

## 2020-12-14 MED ORDER — DEXAMETHASONE SODIUM PHOSPHATE 10 MG/ML IJ SOLN
INTRAMUSCULAR | Status: AC
  Filled 2020-12-14: qty 1

## 2020-12-14 MED ORDER — HYDROMORPHONE HCL 1 MG/ML IJ SOLN
INTRAMUSCULAR | Status: AC
  Filled 2020-12-14: qty 1

## 2020-12-14 MED ORDER — TRAMADOL HCL 50 MG PO TABS
50.0000 mg | ORAL_TABLET | Freq: Four times a day (QID) | ORAL | Status: DC | PRN
  Administered 2020-12-14 – 2020-12-16 (×4): 100 mg via ORAL
  Filled 2020-12-14 (×4): qty 2

## 2020-12-14 MED ORDER — CILOSTAZOL 50 MG PO TABS
50.0000 mg | ORAL_TABLET | Freq: Two times a day (BID) | ORAL | Status: DC
  Administered 2020-12-14 – 2020-12-16 (×3): 50 mg via ORAL
  Filled 2020-12-14 (×5): qty 1

## 2020-12-14 MED ORDER — FLUTICASONE PROPIONATE 50 MCG/ACT NA SUSP
1.0000 | Freq: Every day | NASAL | Status: DC
  Administered 2020-12-16: 1 via NASAL
  Filled 2020-12-14: qty 16

## 2020-12-14 MED ORDER — LIDOCAINE IN D5W 4-5 MG/ML-% IV SOLN
1.0000 mg/min | INTRAVENOUS | Status: DC
  Filled 2020-12-14: qty 500

## 2020-12-14 MED ORDER — SUCCINYLCHOLINE CHLORIDE 200 MG/10ML IV SOSY
PREFILLED_SYRINGE | INTRAVENOUS | Status: DC | PRN
  Administered 2020-12-14: 160 mg via INTRAVENOUS

## 2020-12-14 MED ORDER — THROMBIN 20000 UNITS EX SOLR
CUTANEOUS | Status: DC | PRN
  Administered 2020-12-14: 20 mL via TOPICAL

## 2020-12-14 MED ORDER — ZOLPIDEM TARTRATE 5 MG PO TABS
5.0000 mg | ORAL_TABLET | Freq: Every evening | ORAL | Status: DC | PRN
  Administered 2020-12-14: 5 mg via ORAL
  Filled 2020-12-14: qty 1

## 2020-12-14 MED ORDER — LOSARTAN POTASSIUM 50 MG PO TABS
100.0000 mg | ORAL_TABLET | Freq: Every day | ORAL | Status: DC
  Administered 2020-12-14 – 2020-12-16 (×3): 100 mg via ORAL
  Filled 2020-12-14 (×3): qty 2

## 2020-12-14 MED ORDER — EPHEDRINE SULFATE 50 MG/ML IJ SOLN
INTRAMUSCULAR | Status: DC | PRN
  Administered 2020-12-14: 10 mg via INTRAVENOUS
  Administered 2020-12-14: 5 mg via INTRAVENOUS
  Administered 2020-12-14: 10 mg via INTRAVENOUS
  Administered 2020-12-14: 2.5 mg via INTRAVENOUS

## 2020-12-14 MED ORDER — ONDANSETRON HCL 4 MG/2ML IJ SOLN
INTRAMUSCULAR | Status: AC
  Filled 2020-12-14: qty 2

## 2020-12-14 MED ORDER — MENTHOL 3 MG MT LOZG
1.0000 | LOZENGE | OROMUCOSAL | Status: DC | PRN
  Filled 2020-12-14: qty 9

## 2020-12-14 MED ORDER — ACETAMINOPHEN 325 MG PO TABS
650.0000 mg | ORAL_TABLET | ORAL | Status: DC | PRN
  Administered 2020-12-14: 650 mg via ORAL
  Filled 2020-12-14: qty 2

## 2020-12-14 MED ORDER — PHENYLEPHRINE HCL (PRESSORS) 10 MG/ML IV SOLN
INTRAVENOUS | Status: DC | PRN
  Administered 2020-12-14: 80 ug via INTRAVENOUS
  Administered 2020-12-14 (×2): 120 ug via INTRAVENOUS

## 2020-12-14 MED ORDER — ALUM & MAG HYDROXIDE-SIMETH 200-200-20 MG/5ML PO SUSP
30.0000 mL | Freq: Four times a day (QID) | ORAL | Status: DC | PRN
  Administered 2020-12-14 – 2020-12-16 (×3): 30 mL via ORAL
  Filled 2020-12-14 (×4): qty 30

## 2020-12-14 MED ORDER — SACUBITRIL-VALSARTAN 24-26 MG PO TABS
1.0000 | ORAL_TABLET | Freq: Two times a day (BID) | ORAL | Status: DC
  Administered 2020-12-14 – 2020-12-16 (×3): 1 via ORAL
  Filled 2020-12-14 (×6): qty 1

## 2020-12-14 MED ORDER — ATORVASTATIN CALCIUM 80 MG PO TABS
80.0000 mg | ORAL_TABLET | Freq: Every day | ORAL | Status: DC
  Administered 2020-12-14 – 2020-12-15 (×2): 80 mg via ORAL
  Filled 2020-12-14 (×2): qty 1

## 2020-12-14 MED ORDER — ROCURONIUM BROMIDE 10 MG/ML (PF) SYRINGE
PREFILLED_SYRINGE | INTRAVENOUS | Status: AC
  Filled 2020-12-14: qty 10

## 2020-12-14 MED ORDER — PHENOL 1.4 % MT LIQD: 1.0000 | OROMUCOSAL | Status: DC | PRN

## 2020-12-14 MED ORDER — HYDROMORPHONE HCL 1 MG/ML IJ SOLN
0.2500 mg | INTRAMUSCULAR | Status: DC | PRN
  Administered 2020-12-14 (×2): 0.25 mg via INTRAVENOUS
  Administered 2020-12-14 (×3): 0.5 mg via INTRAVENOUS

## 2020-12-14 MED ORDER — PROPOFOL 10 MG/ML IV BOLUS
INTRAVENOUS | Status: DC | PRN
  Administered 2020-12-14: 120 mg via INTRAVENOUS

## 2020-12-14 MED ORDER — BUPIVACAINE-EPINEPHRINE (PF) 0.25% -1:200000 IJ SOLN
INTRAMUSCULAR | Status: AC
  Filled 2020-12-14: qty 30

## 2020-12-14 MED ORDER — FENTANYL CITRATE (PF) 100 MCG/2ML IJ SOLN
INTRAMUSCULAR | Status: DC | PRN
  Administered 2020-12-14: 50 ug via INTRAVENOUS
  Administered 2020-12-14: 25 ug via INTRAVENOUS
  Administered 2020-12-14: 50 ug via INTRAVENOUS
  Administered 2020-12-14: 75 ug via INTRAVENOUS

## 2020-12-14 MED ORDER — DEXAMETHASONE SODIUM PHOSPHATE 10 MG/ML IJ SOLN
INTRAMUSCULAR | Status: DC | PRN
  Administered 2020-12-14: 4 mg via INTRAVENOUS

## 2020-12-14 MED ORDER — BISACODYL 5 MG PO TBEC: 5.0000 mg | DELAYED_RELEASE_TABLET | Freq: Every day | ORAL | Status: DC | PRN

## 2020-12-14 MED ORDER — PROPOFOL 500 MG/50ML IV EMUL
INTRAVENOUS | Status: DC | PRN
  Administered 2020-12-14: 50 ug/kg/min via INTRAVENOUS

## 2020-12-14 MED ORDER — OXYCODONE HCL 5 MG PO TABS: 5.0000 mg | ORAL_TABLET | Freq: Once | ORAL | Status: DC | PRN

## 2020-12-14 MED ORDER — ONDANSETRON HCL 4 MG/2ML IJ SOLN
INTRAMUSCULAR | Status: DC | PRN
  Administered 2020-12-14: 4 mg via INTRAVENOUS

## 2020-12-14 MED ORDER — LORATADINE 10 MG PO TABS
10.0000 mg | ORAL_TABLET | Freq: Every day | ORAL | Status: DC
  Administered 2020-12-14 – 2020-12-16 (×3): 10 mg via ORAL
  Filled 2020-12-14 (×3): qty 1

## 2020-12-14 MED ORDER — MORPHINE SULFATE (PF) 2 MG/ML IV SOLN
1.0000 mg | INTRAVENOUS | Status: DC | PRN
  Administered 2020-12-14 – 2020-12-16 (×5): 2 mg via INTRAVENOUS
  Filled 2020-12-14 (×5): qty 1

## 2020-12-14 MED ORDER — ALBUMIN HUMAN 5 % IV SOLN: INTRAVENOUS | Status: DC | PRN

## 2020-12-14 MED ORDER — ACETAMINOPHEN 10 MG/ML IV SOLN
INTRAVENOUS | Status: AC
  Filled 2020-12-14: qty 100

## 2020-12-14 MED ORDER — POVIDONE-IODINE 7.5 % EX SOLN: Freq: Once | CUTANEOUS | Status: DC

## 2020-12-14 MED ORDER — POVIDONE-IODINE 7.5 % EX SOLN
Freq: Once | CUTANEOUS | Status: DC
  Filled 2020-12-14: qty 118

## 2020-12-14 MED ORDER — VASOPRESSIN 20 UNIT/ML IV SOLN
INTRAVENOUS | Status: AC
  Filled 2020-12-14: qty 1

## 2020-12-14 MED ORDER — SENNOSIDES-DOCUSATE SODIUM 8.6-50 MG PO TABS
1.0000 | ORAL_TABLET | Freq: Every evening | ORAL | Status: DC | PRN
Start: 2020-12-14 — End: 2020-12-16

## 2020-12-14 MED ORDER — POTASSIUM CHLORIDE IN NACL 20-0.9 MEQ/L-% IV SOLN: INTRAVENOUS | Status: DC

## 2020-12-14 MED ORDER — ONDANSETRON HCL 4 MG/2ML IJ SOLN: 4.0000 mg | Freq: Four times a day (QID) | INTRAMUSCULAR | Status: DC | PRN

## 2020-12-14 MED ORDER — PROPOFOL 10 MG/ML IV BOLUS
INTRAVENOUS | Status: AC
  Filled 2020-12-14: qty 20

## 2020-12-14 MED ORDER — LACTATED RINGERS IV SOLN: INTRAVENOUS | Status: DC

## 2020-12-14 MED ORDER — VASOPRESSIN 20 UNIT/ML IV SOLN
INTRAVENOUS | Status: DC | PRN
  Administered 2020-12-14: .5 [IU] via INTRAVENOUS
  Administered 2020-12-14: 1 [IU] via INTRAVENOUS
  Administered 2020-12-14: .5 [IU] via INTRAVENOUS

## 2020-12-14 MED ORDER — SUCCINYLCHOLINE CHLORIDE 200 MG/10ML IV SOSY
PREFILLED_SYRINGE | INTRAVENOUS | Status: AC
  Filled 2020-12-14: qty 10

## 2020-12-14 MED ORDER — METHOCARBAMOL 500 MG PO TABS
500.0000 mg | ORAL_TABLET | Freq: Four times a day (QID) | ORAL | Status: DC | PRN
  Administered 2020-12-14 – 2020-12-16 (×5): 500 mg via ORAL
  Filled 2020-12-14 (×5): qty 1

## 2020-12-14 MED ORDER — ORAL CARE MOUTH RINSE: 15.0000 mL | Freq: Once | OROMUCOSAL | Status: AC

## 2020-12-14 SURGICAL SUPPLY — 71 items
AGENT HMST KT MTR STRL THRMB (HEMOSTASIS)
APL SKNCLS STERI-STRIP NONHPOA (GAUZE/BANDAGES/DRESSINGS) ×1
BATTALION LLIF ITRADISCAL SHIM (MISCELLANEOUS) ×2
BENZOIN TINCTURE PRP APPL 2/3 (GAUZE/BANDAGES/DRESSINGS) ×1 IMPLANT
BLADE CLIPPER SURG (BLADE) IMPLANT
BLADE SURG 10 STRL SS (BLADE) ×2 IMPLANT
CLIP SPRING STIM LLIF SAFEOP (CLIP) ×1 IMPLANT
COVER BACK TABLE 80X110 HD (DRAPES) ×2 IMPLANT
COVER SURGICAL LIGHT HANDLE (MISCELLANEOUS) ×2 IMPLANT
COVER WAND RF STERILE (DRAPES) ×3 IMPLANT
DILATOR INSULATED LLIF 8-13-18 (NEUROSURGERY SUPPLIES) ×1 IMPLANT
DRAPE C-ARM 42X72 X-RAY (DRAPES) ×2 IMPLANT
DRAPE C-ARMOR (DRAPES) ×2 IMPLANT
DRAPE POUCH INSTRU U-SHP 10X18 (DRAPES) ×2 IMPLANT
DRAPE SURG 17X23 STRL (DRAPES) ×8 IMPLANT
DURAPREP 26ML APPLICATOR (WOUND CARE) ×2 IMPLANT
ELECT BLADE 6.5 EXT (BLADE) ×2 IMPLANT
ELECT CAUTERY BLADE 6.4 (BLADE) ×2 IMPLANT
ELECT KIT SAFEOP SSEP/SURF (KITS) ×2
ELECT REM PT RETURN 9FT ADLT (ELECTROSURGICAL) ×2
ELECTRODE KT SAFEOP SSEP/SURF (KITS) IMPLANT
ELECTRODE REM PT RTRN 9FT ADLT (ELECTROSURGICAL) ×1 IMPLANT
GAUZE 4X4 16PLY RFD (DISPOSABLE) ×2 IMPLANT
GAUZE SPONGE 4X4 12PLY STRL LF (GAUZE/BANDAGES/DRESSINGS) ×1 IMPLANT
GLOVE BIO SURGEON STRL SZ7 (GLOVE) ×4 IMPLANT
GLOVE BIO SURGEON STRL SZ8 (GLOVE) ×2 IMPLANT
GLOVE SRG 8 PF TXTR STRL LF DI (GLOVE) ×1 IMPLANT
GLOVE SURG UNDER POLY LF SZ7 (GLOVE) ×2 IMPLANT
GLOVE SURG UNDER POLY LF SZ8 (GLOVE) ×2
GOWN STRL REUS W/ TWL LRG LVL3 (GOWN DISPOSABLE) ×1 IMPLANT
GOWN STRL REUS W/ TWL XL LVL3 (GOWN DISPOSABLE) ×2 IMPLANT
GOWN STRL REUS W/TWL LRG LVL3 (GOWN DISPOSABLE) ×2
GOWN STRL REUS W/TWL XL LVL3 (GOWN DISPOSABLE) ×4
GUIDEWIRE LLIF TT 320 (WIRE) ×1 IMPLANT
IV CATH 14GX2 1/4 (CATHETERS) ×2 IMPLANT
KIT BASIN OR (CUSTOM PROCEDURE TRAY) ×2 IMPLANT
KIT TURNOVER KIT B (KITS) ×2 IMPLANT
KNIFE ANNULOTOMY (BLADE) ×1 IMPLANT
MARKER SKIN DUAL TIP RULER LAB (MISCELLANEOUS) ×2 IMPLANT
MIX DBX 10CC 35% BONE (Bone Implant) ×1 IMPLANT
NDL HYPO 25GX1X1/2 BEV (NEEDLE) ×1 IMPLANT
NDL SPNL 18GX3.5 QUINCKE PK (NEEDLE) ×1 IMPLANT
NEEDLE HYPO 25GX1X1/2 BEV (NEEDLE) ×2 IMPLANT
NEEDLE SPNL 18GX3.5 QUINCKE PK (NEEDLE) ×2 IMPLANT
NS IRRIG 1000ML POUR BTL (IV SOLUTION) ×4 IMPLANT
PACK LAMINECTOMY ORTHO (CUSTOM PROCEDURE TRAY) ×2 IMPLANT
PACK UNIVERSAL I (CUSTOM PROCEDURE TRAY) ×2 IMPLANT
PAD ARMBOARD 7.5X6 YLW CONV (MISCELLANEOUS) ×4 IMPLANT
PROBE BALL TIP LLIF SAFEOP (NEUROSURGERY SUPPLIES) ×1 IMPLANT
SHIM ITRADISCAL BATTALION LLIF (MISCELLANEOUS) IMPLANT
SPACER LLIF BATT 18X10X50 0D (Spacer) ×1 IMPLANT
SPONGE INTESTINAL PEANUT (DISPOSABLE) ×4 IMPLANT
SPONGE LAP 4X18 RFD (DISPOSABLE) ×2 IMPLANT
SPONGE SURGIFOAM ABS GEL 100 (HEMOSTASIS) ×1 IMPLANT
STAPLER VISISTAT 35W (STAPLE) ×2 IMPLANT
STRIP CLOSURE SKIN 1/2X4 (GAUZE/BANDAGES/DRESSINGS) ×1 IMPLANT
SURGIFLO W/THROMBIN 8M KIT (HEMOSTASIS) IMPLANT
SUT MNCRL AB 4-0 PS2 18 (SUTURE) ×2 IMPLANT
SUT VIC AB 0 CT1 18XCR BRD 8 (SUTURE) ×1 IMPLANT
SUT VIC AB 0 CT1 8-18 (SUTURE) ×2
SUT VIC AB 1 CT1 18XCR BRD 8 (SUTURE) ×1 IMPLANT
SUT VIC AB 1 CT1 8-18 (SUTURE) ×2
SUT VIC AB 2-0 CT2 18 VCP726D (SUTURE) ×2 IMPLANT
SYR BULB IRRIG 60ML STRL (SYRINGE) ×2 IMPLANT
SYR CONTROL 10ML LL (SYRINGE) ×2 IMPLANT
TAPE CLOTH SURG 6X10 WHT LF (GAUZE/BANDAGES/DRESSINGS) ×1 IMPLANT
TOWEL GREEN STERILE (TOWEL DISPOSABLE) ×2 IMPLANT
TOWEL GREEN STERILE FF (TOWEL DISPOSABLE) ×2 IMPLANT
TRAY FOLEY MTR SLVR 16FR STAT (SET/KITS/TRAYS/PACK) ×2 IMPLANT
WATER STERILE IRR 1000ML POUR (IV SOLUTION) ×2 IMPLANT
YANKAUER SUCT BULB TIP NO VENT (SUCTIONS) ×2 IMPLANT

## 2020-12-14 NOTE — Anesthesia Procedure Notes (Signed)
Procedure Name: Intubation Date/Time: 12/14/2020 12:37 PM Performed by: Georgia Duff, CRNA Pre-anesthesia Checklist: Patient identified, Emergency Drugs available, Suction available and Patient being monitored Patient Re-evaluated:Patient Re-evaluated prior to induction Oxygen Delivery Method: Circle System Utilized Preoxygenation: Pre-oxygenation with 100% oxygen Induction Type: IV induction Ventilation: Mask ventilation without difficulty and Oral airway inserted - appropriate to patient size Grade View: Grade I Tube type: Oral Tube size: 7.5 mm Number of attempts: 1 Airway Equipment and Method: Stylet and Oral airway Placement Confirmation: ETT inserted through vocal cords under direct vision,  positive ETCO2 and breath sounds checked- equal and bilateral Secured at: 21 cm Tube secured with: Tape Dental Injury: Teeth and Oropharynx as per pre-operative assessment

## 2020-12-14 NOTE — Op Note (Signed)
PATIENT NAME: Philip Hurst   MEDICAL RECORD NO.:   [id number]    DATE OF BIRTH: May 14, 1941   DATE OF PROCEDURE: 12/14/2020                               OPERATIVE REPORT   PREOPERATIVE DIAGNOSES: 1.  Severe left greater than right leg pain 2.  Severe spinal stenosis, L2-3 3.  S/p previous L3/4 fusion by another provider   POSTOPERATIVE DIAGNOSES: 1.  Severe left greater than right leg pain 2.  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  S/p previous L3/4 fusion by another provider   PROCEDURE:  1.  Right-sided lateral interbody fusion, L2/3  via direct lateral retroperitoneal approach. 2.  Insertion of interbody device x1 (10 x 18 mm x 50 mm Alphatec intervertebral spacer). 3.  Use of morselized allograft -- ViviGen.   4.  Intraoperative use of fluoroscopy.   SURGEON:  Phylliss Bob, MD   ASSISTANT:  Pricilla Holm PA-C.   ANESTHESIA:  General endotracheal anesthesia.   COMPLICATIONS:  None.   DISPOSITION:  Stable.   ESTIMATED BLOOD LOSS:  Minimal.   INDICATIONS:  Briefly, Philip Hurst is a very pleasant 80 year old male who did present to me with ongoing pain and weakness in the left leg.  He is noted to be status post a previous fusion at L3-4.  He did well from the surgery, but more recently, has been having substantial pain and weakness into his bilateral legs, left greater than right.  His MRI did reveal severe stenosis at L2-3, with severe intervertebral height loss.  Given his ongoing debilitating symptoms, we did discuss proceeding with a two-stage procedure.  Specifically, we discussed proceeding with a lateral interbody fusion at L2-3, to be followed by a revision posterior fusion on the following day. The patient did wish to proceed, after a full understanding of the risks and benefits of surgery.   DESCRIPTION OF PROCEDURE:  On 12/14/2020, the patient was brought to surgery and general endotracheal anesthesia was administered.  The patient was placed in the lateral decubitus  position, with the left side up. Neurologic monitoring leads were placed by the monitoring technician.  The patient's torso and lower extremities were secured to the bed.  The patient's hips and knees were flexed in order to lessen the tension on the psoas musculature.  The right flank was then prepped and draped in the usual sterile fashion.  The bed was flexed, in order to optimize exposure to the L2/3 intervertebral space.  After a timeout procedure was performed, a right-sided transverse incision was made over the right flank overlying the L2/3 intervertebral space.  The retroperitoneal space was encountered, after dissection through the oblique musculature.  The peritoneum was bluntly swept anteriorly, and the psoas was readily identified.  I did use a series of dilators to dock over the L2/3 intervertebral space.  I did use neurologic monitoring while placing the dilators, in order to ensure that there were no neurologic structures in the immediate vicinity of the dilators.  The lumbar plexus was noted to be posterior.  A self-retaining retractor was placed, and was attached to a rigid arm.  The retractor was very gently dilated and a shim was placed into the L2/3 intervertebral space.  I then used a knife to perform an annulotomy at the lateral aspect of the L2/3 intervertebral space.  I then used a series of curettes and pituitary rongeurs in order  to perform a thorough and complete L2/3 intervertebral diskectomy.  The contralateral annulus was released.  I then placed a series of intervertebral spacer trials, and I did feel that a 12 x 18 mm x 50 mm spacer would be the most appropriate fit.  The appropriate spacer was then packed with ViviGen and tamped into position.  I was very pleased with the final resting position of the intervertebral spacer.  Excellent height restoration was noted. I was very pleased with the final AP and lateral fluoroscopic images and the excellent restoration of disk height  identified on both AP and lateral images.  At this point, the wound was copiously irrigated.  The fascia, internal, and external oblique musculature was closed using #1 Vicryl.  The subcutaneous layer was closed using 2-0 Vicryl and the skin was closed using 4-0 Monocryl.       Of note, I did use neurologic monitoring throughout the entire surgery, and there was no sustained EMG activity noted throughout the entire surgery. All instrument counts were correct at the termination of the procedure.   Of note, Pricilla Holm was my assistant throughout surgery, and did aid in retraction, placement of the hardware, suctioning, and closure.   Phylliss Bob, MD

## 2020-12-14 NOTE — H&P (Signed)
PREOPERATIVE H&P  Chief Complaint: Right leg pain  HPI: Philip Hurst is a 80 y.o. male who presents with ongoing pain in the right greater than left leg  MRI reveals severe spinal stenosis at L2/3, the level above the pateint's previous fusion  Patient has failed multiple forms of conservative care and continues to have pain (see office notes for additional details regarding the patient's full course of treatment)  Past Medical History:  Diagnosis Date  . Arthritis    "back, joints in right hand" (08/18/2013)  . CAD (coronary artery disease), single vessel disease with collaterals, medical therapy 09/12/2011  . Chronic lower back pain   . Claudication of left lower extremity, ABI Lt 0.52 -lifestyle limiting  ABI rt 0.90 01/30/2013  . Coronary artery disease    OCCLUDED RCA  . Hyperlipidemia   . Hypertension   . Myocardial infarction ([place])   . Normal cardiac stress test, No ischemia 12/15/12 01/30/2013  . PAD (peripheral artery disease) ([place])    Past Surgical History:  Procedure Laterality Date  . BACK SURGERY    . CARDIAC CATHETERIZATION  @2002    1 vessel disease w/collaterals,medical therapy  . CARDIOVASCULAR STRESS TEST  12/07/2008   EF 58%, NO ISCHEMIA  . CATARACT EXTRACTION W/ INTRAOCULAR LENS  IMPLANT, BILATERAL Bilateral   . CORONARY ARTERY BYPASS GRAFT  07/22/2019   Procedure: OFF PUMP CORONARY ARTERY BYPASS GRAFTING (CABG) times two using left internal mammary artery and right leg saphenous vein;  Surgeon: Lajuana Matte, MD;  Location: [facility];  Service: Open Heart Surgery;;  . 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 LEFT HEART CATH AND CORONARY ANGIOGRAPHY N/A 07/19/2019   Procedure: LEFT HEART CATH AND CORONARY ANGIOGRAPHY;  Surgeon: Belva Crome, MD;  Location: [facility];  Service: Cardiovascular;  Laterality: N/A;  . lower extremities arterial doppler  02/16/2013   bilateral ABIs normal values w/o evidence arterial insuff.  at rest; left  lower extrem. norm. left popliteal artery stent norm. patency  . LOWER EXTREMITY ANGIOGRAM Left 08/18/2013   Procedure: LOWER EXTREMITY ANGIOGRAM;  Surgeon: Lorretta Harp, MD;  Location: [facility] CATH LAB;  Service: Cardiovascular;  Laterality: Left;  . LUMBAR DISC SURGERY  1990's   "herniated disc" (08/18/2013)  . PERCUTANEOUS STENT INTERVENTION  01/30/2013   Procedure: PERCUTANEOUS STENT INTERVENTION;  Surgeon: Lorretta Harp, MD;  Location: [facility] CATH LAB;  Service: Cardiovascular;;  . PERIPHERAL VASCULAR CATHETERIZATION N/A 09/05/2015   Procedure: Lower Extremity Angiography;  Surgeon: Lorretta Harp, MD;  Location: [facility];  Service: Cardiovascular;  Laterality: N/A;  . POPLITEAL ARTERY STENT Left 01/30/13  . POPLITEAL ARTERY STENT Left 10/'28/2014   successful Viabahn covered stent placement within the previously placed IDEV  stents/notes 08/18/2013  . POSTERIOR LUMBAR FUSION  1990's  . TONSILLECTOMY    . WRIST FUSION Right 1990's?   "first OR didn't correct problem" (08/18/2013)   Social History   Socioeconomic History  . Marital status: Married    Spouse name: Philip Hurst  . Number of children: 4  . Years of education: Not on file  . Highest education level: Not on file  Occupational History  . Not on file  Tobacco Use  . Smoking status: Former Smoker    Packs/day: 1.00    Years: 50.00    Pack years: 50.00    Types: Cigarettes    Quit date: 08/26/1997    Years since quitting: 23.3  .  Smokeless tobacco: Former Systems developer    Types: Secondary school teacher  . Vaping Use: Never used  Substance and Sexual Activity  . Alcohol use: Yes    Comment: occ  . Drug use: No  . Sexual activity: Never  Other Topics Concern  . Not on file  Social History Narrative  . Not on file   Social Determinants of Health   Financial Resource Strain: Not on file  Food Insecurity: Not on file  Transportation Needs: Not on file  Physical Activity: Not on file  Stress: Not on file   Social Connections: Not on file   Family History  Problem Relation Age of Onset  . Seizures Mother   . Cancer Father   . Coronary artery disease Brother   . Diabetes Brother   . Lung cancer Brother   . Lung cancer Sister    Allergies  Allergen Reactions  . Plavix [Clopidogrel Bisulfate] Itching and Rash   Prior to Admission medications   Medication Sig Start Date End Date Taking? Authorizing Provider  Acetaminophen 500 MG capsule Take 1,500 mg by mouth in the morning and at bedtime.   Yes [provider]  aspirin EC 325 MG EC tablet Take 1 tablet (325 mg total) by mouth daily. 07/29/19  Yes Conte, Tessa N, PA-C  atorvastatin (LIPITOR) 80 MG tablet TAKE 1 TABLET BY MOUTH DAILY AT SIX IN THE EVENING Patient taking differently: Take 80 mg by mouth daily at 6 PM. 09/19/20  Yes Lorretta Harp, MD  cetirizine (ZYRTEC) 10 MG tablet Take 1 tablet (10 mg total) by mouth at bedtime. 11/14/20  Yes Spero Geralds, MD  cilostazol (PLETAL) 50 MG tablet Take 1 tablet (50 mg total) by mouth 2 (two) times daily. 11/29/20  Yes Lorretta Harp, MD  fluticasone (FLONASE) 50 MCG/ACT nasal spray Place 1 spray into both nostrils daily. 11/14/20  Yes Spero Geralds, MD  ibuprofen (ADVIL) 200 MG tablet Take 800 mg by mouth in the morning and at bedtime.   Yes [provider]  ketoconazole (NIZORAL) 2 % shampoo Apply 1 application topically 2 (two) times a week. 10/28/20  Yes [provider]  losartan (COZAAR) 100 MG tablet Take 100 mg by mouth daily. 10/26/20  Yes [provider]  sacubitril-valsartan (ENTRESTO) 24-26 MG Take 1 tablet by mouth 2 (two) times daily. 04/28/20  Yes Lorretta Harp, MD  tamsulosin (FLOMAX) 0.4 MG CAPS capsule Take 0.8 mg by mouth daily. 10/26/20  Yes [provider]     All other systems have been reviewed and were otherwise negative with the exception of those mentioned in the HPI and as above.  Physical Exam: Vitals:   12/14/20 0858   BP: (!) 114/54  Pulse: (!) 102  Resp: 18  Temp: 97.6 F (36.4 C)  SpO2: 97%    Body mass index is 36.44 kg/m.  General: Alert, no acute distress Cardiovascular: No pedal edema Respiratory: No cyanosis, no use of accessory musculature Skin: No lesions in the area of chief complaint Neurologic: Sensation intact distally Psychiatric: Patient is competent for consent with normal mood and affect Lymphatic: No axillary or cervical lymphadenopathy   Assessment/Plan: BACK PAIN AND BILATERAL LEG PAIN AND WEAKNESS, MRI NOTABLE FOR SEVERE SPINAL STENOSIS AT LUMBAR 2 - LUMBAR 3 Plan for Procedure(s): RIGHT LATERAL INTERBODY FUSION LUMBAR 2 - LUMBAR 3 WITH INSTRUMENTATION AND ALLOGRAFT   Norva Karvonen, MD 12/14/2020 11:16 AM

## 2020-12-14 NOTE — Transfer of Care (Signed)
Immediate Anesthesia Transfer of Care Note  Patient: Philip Hurst  Procedure(s) Performed: RIGHT LATERAL INTERBODY FUSION LUMBAR TWO - LUMBAR THREE WITH INSTRUMENTATION AND ALLOGRAFT (Right )  Patient Location: PACU  Anesthesia Type:General  Level of Consciousness: awake, alert  and oriented  Airway & Oxygen Therapy: Patient Spontanous Breathing and Patient connected to face mask oxygen  Post-op Assessment: Report given to RN and Post -op Vital signs reviewed and stable  Post vital signs: Reviewed and stable  Last Vitals:  Vitals Value Taken Time  BP    Temp 36.3 C 12/14/20 1505  Pulse    Resp    SpO2      Last Pain:  Vitals:   12/14/20 0929  PainSc: 4          Complications: No complications documented.

## 2020-12-14 NOTE — Anesthesia Postprocedure Evaluation (Signed)
Anesthesia Post Note  Patient: Philip Hurst  Procedure(s) Performed: RIGHT LATERAL INTERBODY FUSION LUMBAR TWO - LUMBAR THREE WITH INSTRUMENTATION AND ALLOGRAFT (Right )     Patient location during evaluation: PACU Anesthesia Type: General Level of consciousness: sedated Pain management: pain level controlled Vital Signs Assessment: post-procedure vital signs reviewed and stable Respiratory status: spontaneous breathing and respiratory function stable Cardiovascular status: stable Postop Assessment: no apparent nausea or vomiting Anesthetic complications: no   No complications documented.  Last Vitals:  Vitals:   12/14/20 1521 12/14/20 1537  BP: (!) 112/53 (!) 92/51  Pulse: (!) 106 (!) 101  Resp: 16 12  Temp:    SpO2: 93% 97%    Last Pain:  Vitals:   12/14/20 1537  PainSc: 6                  [name]

## 2020-12-14 NOTE — Anesthesia Preprocedure Evaluation (Addendum)
Anesthesia Evaluation  Patient identified by MRN, date of birth, ID band Patient awake    Reviewed: Allergy & Precautions, NPO status , Patient's Chart, lab work & pertinent test results  Airway Mallampati: III  TM Distance: >3 FB Neck ROM: Full    Dental  (+) Edentulous Upper, Edentulous Lower   Pulmonary former smoker,    Pulmonary exam normal breath sounds clear to auscultation       Cardiovascular hypertension, Pt. on medications + CAD, + Past MI, + CABG and + Peripheral Vascular Disease  Normal cardiovascular exam Rhythm:Regular Rate:Normal  ECG: ratet 85   Neuro/Psych  Neuromuscular disease negative psych ROS   GI/Hepatic negative GI ROS, Neg liver ROS,   Endo/Other  negative endocrine ROS  Renal/GU Renal InsufficiencyRenal disease     Musculoskeletal  (+) Arthritis ,   Abdominal (+) + obese,   Peds  Hematology  (+) anemia , HLD   Anesthesia Other Findings LOW BACK PAIN AND BILATERAL LEG PAIN AND WEAKNESS, MRI NOTABLE FOR SEVERE SPINAL STENOSIS AT LUMBAR 2 - LUMBAR 3  Reproductive/Obstetrics                            Anesthesia Physical Anesthesia Plan  ASA: III  Anesthesia Plan: General   Post-op Pain Management:    Induction: Intravenous  PONV Risk Score and Plan: 2 and Ondansetron, Dexamethasone and Treatment may vary due to age or medical condition  Airway Management Planned: Oral ETT  Additional Equipment:   Intra-op Plan:   Post-operative Plan: Extubation in OR  Informed Consent: I have reviewed the patients History and Physical, chart, labs and discussed the procedure including the risks, benefits and alternatives for the proposed anesthesia with the patient or authorized representative who has indicated his/her understanding and acceptance.     Dental advisory given  Plan Discussed with: CRNA  Anesthesia Plan Comments: (Extensive cardiac history. Fist stage  surgery on 2.23.22)       Anesthesia Quick Evaluation

## 2020-12-15 ENCOUNTER — Encounter (HOSPITAL_COMMUNITY): Payer: Self-pay | Admitting: Orthopedic Surgery

## 2020-12-15 ENCOUNTER — Inpatient Hospital Stay (HOSPITAL_COMMUNITY): Payer: Medicare Other | Admitting: Anesthesiology

## 2020-12-15 ENCOUNTER — Inpatient Hospital Stay (HOSPITAL_COMMUNITY): Payer: Medicare Other

## 2020-12-15 ENCOUNTER — Inpatient Hospital Stay (HOSPITAL_COMMUNITY)
Admission: RE | Disposition: A | Discharge status: Home / Self Care | Payer: Self-pay | Source: Home / Self Care | Attending: Orthopedic Surgery

## 2020-12-15 ENCOUNTER — Inpatient Hospital Stay (HOSPITAL_COMMUNITY): Admission: RE | Admit: 2020-12-15 | Payer: Medicare Other | Source: Home / Self Care | Admitting: Orthopedic Surgery

## 2020-12-15 SURGERY — POSTERIOR LUMBAR FUSION 1 LEVEL
Anesthesia: General

## 2020-12-15 MED ORDER — FENTANYL CITRATE (PF) 100 MCG/2ML IJ SOLN
25.0000 ug | INTRAMUSCULAR | Status: DC | PRN
  Administered 2020-12-15: 50 ug via INTRAVENOUS

## 2020-12-15 MED ORDER — LACTATED RINGERS IV SOLN: INTRAVENOUS | Status: DC | PRN

## 2020-12-15 MED ORDER — PHENYLEPHRINE HCL (PRESSORS) 10 MG/ML IV SOLN
INTRAVENOUS | Status: AC
  Filled 2020-12-15: qty 1

## 2020-12-15 MED ORDER — ENSURE PRE-SURGERY PO LIQD
296.0000 mL | Freq: Once | ORAL | Status: AC
  Administered 2020-12-15: 296 mL via ORAL
  Filled 2020-12-15: qty 296

## 2020-12-15 MED ORDER — MIDAZOLAM HCL 2 MG/2ML IJ SOLN
INTRAMUSCULAR | Status: AC
  Filled 2020-12-15: qty 2

## 2020-12-15 MED ORDER — ROCURONIUM BROMIDE 10 MG/ML (PF) SYRINGE
PREFILLED_SYRINGE | INTRAVENOUS | Status: AC
  Filled 2020-12-15: qty 10

## 2020-12-15 MED ORDER — 0.9 % SODIUM CHLORIDE (POUR BTL) OPTIME
TOPICAL | Status: DC | PRN
  Administered 2020-12-15 (×2): 1000 mL

## 2020-12-15 MED ORDER — FENTANYL CITRATE (PF) 250 MCG/5ML IJ SOLN
INTRAMUSCULAR | Status: AC
  Filled 2020-12-15: qty 5

## 2020-12-15 MED ORDER — PHENYLEPHRINE 40 MCG/ML (10ML) SYRINGE FOR IV PUSH (FOR BLOOD PRESSURE SUPPORT)
PREFILLED_SYRINGE | INTRAVENOUS | Status: AC
  Filled 2020-12-15: qty 10

## 2020-12-15 MED ORDER — DEXAMETHASONE SODIUM PHOSPHATE 10 MG/ML IJ SOLN
INTRAMUSCULAR | Status: AC
  Filled 2020-12-15: qty 1

## 2020-12-15 MED ORDER — PROPOFOL 10 MG/ML IV BOLUS
INTRAVENOUS | Status: DC | PRN
Start: 2020-12-15 — End: 2020-12-15
  Administered 2020-12-15: 130 mg via INTRAVENOUS

## 2020-12-15 MED ORDER — ONDANSETRON HCL 4 MG/2ML IJ SOLN
4.0000 mg | Freq: Once | INTRAMUSCULAR | Status: AC | PRN
  Administered 2020-12-15: 4 mg via INTRAVENOUS

## 2020-12-15 MED ORDER — LIDOCAINE 2% (20 MG/ML) 5 ML SYRINGE
INTRAMUSCULAR | Status: DC | PRN
  Administered 2020-12-15: 20 mg via INTRAVENOUS

## 2020-12-15 MED ORDER — THROMBIN 20000 UNITS EX SOLR
CUTANEOUS | Status: DC | PRN
  Administered 2020-12-15: 20000 [IU] via TOPICAL

## 2020-12-15 MED ORDER — FENTANYL CITRATE (PF) 100 MCG/2ML IJ SOLN
INTRAMUSCULAR | Status: AC
  Filled 2020-12-15: qty 2

## 2020-12-15 MED ORDER — BUPIVACAINE-EPINEPHRINE (PF) 0.25% -1:200000 IJ SOLN
INTRAMUSCULAR | Status: AC
  Filled 2020-12-15: qty 30

## 2020-12-15 MED ORDER — ONDANSETRON HCL 4 MG/2ML IJ SOLN
INTRAMUSCULAR | Status: DC | PRN
  Administered 2020-12-15: 4 mg via INTRAVENOUS

## 2020-12-15 MED ORDER — TRAMADOL HCL 50 MG PO TABS: 50.0000 mg | ORAL_TABLET | Freq: Four times a day (QID) | ORAL | 0 refills | Status: AC | PRN

## 2020-12-15 MED ORDER — LIDOCAINE 2% (20 MG/ML) 5 ML SYRINGE
INTRAMUSCULAR | Status: AC
  Filled 2020-12-15: qty 5

## 2020-12-15 MED ORDER — DEXAMETHASONE SODIUM PHOSPHATE 10 MG/ML IJ SOLN
INTRAMUSCULAR | Status: DC | PRN
  Administered 2020-12-15: 5 mg via INTRAVENOUS

## 2020-12-15 MED ORDER — BUPIVACAINE LIPOSOME 1.3 % IJ SUSP
20.0000 mL | Freq: Once | INTRAMUSCULAR | Status: AC
  Administered 2020-12-15: 19 mL
  Filled 2020-12-15: qty 20

## 2020-12-15 MED ORDER — TRAMADOL HCL 50 MG PO TABS
ORAL_TABLET | ORAL | Status: AC
  Filled 2020-12-15: qty 2

## 2020-12-15 MED ORDER — ACETAMINOPHEN 10 MG/ML IV SOLN
INTRAVENOUS | Status: AC
  Filled 2020-12-15: qty 100

## 2020-12-15 MED ORDER — ROCURONIUM BROMIDE 10 MG/ML (PF) SYRINGE
PREFILLED_SYRINGE | INTRAVENOUS | Status: DC | PRN
  Administered 2020-12-15: 60 mg via INTRAVENOUS
  Administered 2020-12-15: 10 mg via INTRAVENOUS
  Administered 2020-12-15: 20 mg via INTRAVENOUS

## 2020-12-15 MED ORDER — ALBUMIN HUMAN 5 % IV SOLN: INTRAVENOUS | Status: DC | PRN

## 2020-12-15 MED ORDER — METHYLENE BLUE 0.5 % INJ SOLN
INTRAVENOUS | Status: AC
  Filled 2020-12-15: qty 10

## 2020-12-15 MED ORDER — FENTANYL CITRATE (PF) 250 MCG/5ML IJ SOLN
INTRAMUSCULAR | Status: DC | PRN
  Administered 2020-12-15 (×2): 50 ug via INTRAVENOUS

## 2020-12-15 MED ORDER — THROMBIN (RECOMBINANT) 20000 UNITS EX SOLR
CUTANEOUS | Status: AC
  Filled 2020-12-15: qty 20000

## 2020-12-15 MED ORDER — BUPIVACAINE-EPINEPHRINE 0.25% -1:200000 IJ SOLN
INTRAMUSCULAR | Status: DC | PRN
  Administered 2020-12-15: 27 mL

## 2020-12-15 MED ORDER — METHYLENE BLUE 0.5 % INJ SOLN
INTRAVENOUS | Status: DC | PRN
  Administered 2020-12-15: 1 mL

## 2020-12-15 MED ORDER — METHOCARBAMOL 500 MG PO TABS: 500.0000 mg | ORAL_TABLET | Freq: Four times a day (QID) | ORAL | 0 refills | Status: DC | PRN

## 2020-12-15 MED ORDER — ONDANSETRON HCL 4 MG/2ML IJ SOLN
INTRAMUSCULAR | Status: AC
  Filled 2020-12-15: qty 2

## 2020-12-15 MED ORDER — SUGAMMADEX SODIUM 200 MG/2ML IV SOLN
INTRAVENOUS | Status: DC | PRN
  Administered 2020-12-15 (×2): 100 mg via INTRAVENOUS

## 2020-12-15 MED ORDER — PHENYLEPHRINE 40 MCG/ML (10ML) SYRINGE FOR IV PUSH (FOR BLOOD PRESSURE SUPPORT)
PREFILLED_SYRINGE | INTRAVENOUS | Status: DC | PRN
  Administered 2020-12-15 (×2): 120 ug via INTRAVENOUS
  Administered 2020-12-15 (×2): 80 ug via INTRAVENOUS

## 2020-12-15 MED ORDER — PHENYLEPHRINE HCL-NACL 10-0.9 MG/250ML-% IV SOLN
INTRAVENOUS | Status: DC | PRN
  Administered 2020-12-15: 30 ug/min via INTRAVENOUS

## 2020-12-15 MED ORDER — PROPOFOL 10 MG/ML IV BOLUS
INTRAVENOUS | Status: AC
  Filled 2020-12-15: qty 20

## 2020-12-15 SURGICAL SUPPLY — 92 items
AGENT HMST KT MTR STRL THRMB (HEMOSTASIS)
APL SKNCLS STERI-STRIP NONHPOA (GAUZE/BANDAGES/DRESSINGS) ×1
BENZOIN TINCTURE PRP APPL 2/3 (GAUZE/BANDAGES/DRESSINGS) ×2 IMPLANT
BLADE CLIPPER SURG (BLADE) IMPLANT
BUR PRESCISION 1.7 ELITE (BURR) ×2 IMPLANT
BUR ROUND FLUTED 5 RND (BURR) ×2 IMPLANT
BUR ROUND PRECISION 4.0 (BURR) IMPLANT
BUR SABER RD CUTTING 3.0 (BURR) IMPLANT
CARTRIDGE OIL MAESTRO DRILL (MISCELLANEOUS) ×1 IMPLANT
CLSR STERI-STRIP ANTIMIC 1/2X4 (GAUZE/BANDAGES/DRESSINGS) ×1 IMPLANT
CNTNR URN SCR LID CUP LEK RST (MISCELLANEOUS) ×1 IMPLANT
CONT SPEC 4OZ STRL OR WHT (MISCELLANEOUS) ×2
COVER MAYO STAND STRL (DRAPES) ×4 IMPLANT
COVER SURGICAL LIGHT HANDLE (MISCELLANEOUS) ×2 IMPLANT
COVER WAND RF STERILE (DRAPES) ×2 IMPLANT
DIFFUSER DRILL AIR PNEUMATIC (MISCELLANEOUS) ×2 IMPLANT
DRAIN CHANNEL 15F RND FF W/TCR (WOUND CARE) IMPLANT
DRAPE C-ARM 42X72 X-RAY (DRAPES) ×2 IMPLANT
DRAPE C-ARMOR (DRAPES) IMPLANT
DRAPE POUCH INSTRU U-SHP 10X18 (DRAPES) ×2 IMPLANT
DRAPE SURG 17X23 STRL (DRAPES) ×8 IMPLANT
DURAPREP 26ML APPLICATOR (WOUND CARE) ×2 IMPLANT
ELECT BLADE 4.0 EZ CLEAN MEGAD (MISCELLANEOUS) ×2
ELECT CAUTERY BLADE 6.4 (BLADE) ×2 IMPLANT
ELECT REM PT RETURN 9FT ADLT (ELECTROSURGICAL) ×2
ELECTRODE BLDE 4.0 EZ CLN MEGD (MISCELLANEOUS) ×1 IMPLANT
ELECTRODE REM PT RTRN 9FT ADLT (ELECTROSURGICAL) ×1 IMPLANT
EVACUATOR SILICONE 100CC (DRAIN) IMPLANT
FILTER STRAW FLUID ASPIR (MISCELLANEOUS) ×2 IMPLANT
GAUZE 4X4 16PLY RFD (DISPOSABLE) ×2 IMPLANT
GAUZE SPONGE 4X4 12PLY STRL (GAUZE/BANDAGES/DRESSINGS) ×2 IMPLANT
GLOVE BIO SURGEON STRL SZ7 (GLOVE) ×2 IMPLANT
GLOVE BIO SURGEON STRL SZ8 (GLOVE) ×2 IMPLANT
GLOVE SRG 8 PF TXTR STRL LF DI (GLOVE) ×1 IMPLANT
GLOVE SURG UNDER POLY LF SZ7 (GLOVE) ×2 IMPLANT
GLOVE SURG UNDER POLY LF SZ8 (GLOVE) ×2
GOWN STRL REUS W/ TWL LRG LVL3 (GOWN DISPOSABLE) ×2 IMPLANT
GOWN STRL REUS W/ TWL XL LVL3 (GOWN DISPOSABLE) ×1 IMPLANT
GOWN STRL REUS W/TWL LRG LVL3 (GOWN DISPOSABLE) ×4
GOWN STRL REUS W/TWL XL LVL3 (GOWN DISPOSABLE) ×2
IV CATH 14GX2 1/4 (CATHETERS) ×2 IMPLANT
KIT ALARA NEURO ACCESS (KITS) ×2 IMPLANT
KIT BASIN OR (CUSTOM PROCEDURE TRAY) ×2 IMPLANT
KIT POSITION SURG JACKSON T1 (MISCELLANEOUS) ×2 IMPLANT
KIT TURNOVER KIT B (KITS) ×2 IMPLANT
MARKER SKIN DUAL TIP RULER LAB (MISCELLANEOUS) ×4 IMPLANT
NDL 18GX1X1/2 (RX/OR ONLY) (NEEDLE) ×1 IMPLANT
NDL HYPO 25GX1X1/2 BEV (NEEDLE) ×1 IMPLANT
NDL SPNL 18GX3.5 QUINCKE PK (NEEDLE) ×2 IMPLANT
NEEDLE 18GX1X1/2 (RX/OR ONLY) (NEEDLE) ×2 IMPLANT
NEEDLE 22X1 1/2 (OR ONLY) (NEEDLE) ×4 IMPLANT
NEEDLE HYPO 25GX1X1/2 BEV (NEEDLE) ×2 IMPLANT
NEEDLE SPNL 18GX3.5 QUINCKE PK (NEEDLE) ×4 IMPLANT
NS IRRIG 1000ML POUR BTL (IV SOLUTION) ×1 IMPLANT
OIL CARTRIDGE MAESTRO DRILL (MISCELLANEOUS) ×2
PACK LAMINECTOMY ORTHO (CUSTOM PROCEDURE TRAY) ×2 IMPLANT
PACK UNIVERSAL I (CUSTOM PROCEDURE TRAY) ×2 IMPLANT
PAD ARMBOARD 7.5X6 YLW CONV (MISCELLANEOUS) ×4 IMPLANT
PATTIES SURGICAL .5 X1 (DISPOSABLE) ×2 IMPLANT
PATTIES SURGICAL .5X1.5 (GAUZE/BANDAGES/DRESSINGS) ×2 IMPLANT
PUTTY DBX 1CC (Putty) ×2 IMPLANT
PUTTY DBX 1CC DEPUY (Putty) IMPLANT
ROD PRE LORDOSED VIPER 5.5X50 (Rod) ×1 IMPLANT
ROD VIPER2 5.5X45 PRE LARDOSED (Rod) ×1 IMPLANT
SCREW POLY VIPER MIS 7X40MM (Screw) ×2 IMPLANT
SCREW POLY VIPER2 7X40MM (Screw) ×2 IMPLANT
SCREW SET SINGLE INNER MIS (Screw) ×4 IMPLANT
SPONGE INTESTINAL PEANUT (DISPOSABLE) ×2 IMPLANT
SPONGE SURGIFOAM ABS GEL 100 (HEMOSTASIS) ×2 IMPLANT
STRIP CLOSURE SKIN 1/2X4 (GAUZE/BANDAGES/DRESSINGS) ×4 IMPLANT
SUCTION FRAZIER HANDLE 10FR (MISCELLANEOUS) ×2
SUCTION TUBE FRAZIER 10FR DISP (MISCELLANEOUS) IMPLANT
SURGIFLO W/THROMBIN 8M KIT (HEMOSTASIS) IMPLANT
SUT BONE WAX W31G (SUTURE) ×1 IMPLANT
SUT MNCRL AB 4-0 PS2 18 (SUTURE) ×3 IMPLANT
SUT VIC AB 0 CT1 18XCR BRD 8 (SUTURE) ×1 IMPLANT
SUT VIC AB 0 CT1 8-18 (SUTURE) ×2
SUT VIC AB 1 CT1 18XCR BRD 8 (SUTURE) ×1 IMPLANT
SUT VIC AB 1 CT1 8-18 (SUTURE) ×4
SUT VIC AB 2-0 CT1 18 (SUTURE) ×1 IMPLANT
SUT VIC AB 2-0 CT2 18 VCP726D (SUTURE) ×3 IMPLANT
SYR 20ML LL LF (SYRINGE) ×4 IMPLANT
SYR BULB IRRIG 60ML STRL (SYRINGE) ×2 IMPLANT
SYR CONTROL 10ML LL (SYRINGE) ×4 IMPLANT
SYR TB 1ML LUER SLIP (SYRINGE) ×2 IMPLANT
TAP CANN VIPER2 DL 6.0 (TAP) ×2 IMPLANT
TAP CANN VIPER2 DL 7.0 (TAP) ×2 IMPLANT
TAPE CLOTH SURG 4X10 WHT LF (GAUZE/BANDAGES/DRESSINGS) ×1 IMPLANT
TRAY FOLEY MTR SLVR 16FR STAT (SET/KITS/TRAYS/PACK) ×2 IMPLANT
TUBE CONNECTING 20X1/4 (TUBING) ×1 IMPLANT
WATER STERILE IRR 1000ML POUR (IV SOLUTION) ×2 IMPLANT
YANKAUER SUCT BULB TIP NO VENT (SUCTIONS) ×2 IMPLANT

## 2020-12-15 NOTE — H&P (Signed)
Patient presents today for stage 2 of his surgery, specifically, a posterior spinal fusion with revision instrumentation at L2-3.  We will proceed as scheduled.

## 2020-12-15 NOTE — Anesthesia Postprocedure Evaluation (Signed)
Anesthesia Post Note  Patient: Philip Hurst  Procedure(s) Performed: POSTERIOR SPINAL FUSION LUMBAR 2- LUMBAR 3 WITH INSTRUMENTATION AND ALLOGRAFT (N/A )     Patient location during evaluation: PACU Anesthesia Type: General Level of consciousness: awake Pain management: pain level controlled Vital Signs Assessment: post-procedure vital signs reviewed and stable Respiratory status: spontaneous breathing, nonlabored ventilation, respiratory function stable and patient connected to nasal cannula oxygen Cardiovascular status: blood pressure returned to baseline and stable Postop Assessment: no apparent nausea or vomiting Anesthetic complications: no   No complications documented.  Last Vitals:  Vitals:   12/15/20 1647 12/15/20 1649  BP: 124/68   Pulse: 100   Resp: 18   Temp:  36.4 C  SpO2: 96%     Last Pain:  Vitals:   12/15/20 1934  TempSrc:   PainSc: Asleep                 Bryella Diviney P Smith Mcnicholas

## 2020-12-15 NOTE — Progress Notes (Signed)
Patient enroute to OR for surgery . In no acute distress at the time of transfer. Report given to Dewitt Hoes CRNA. Will follow up.

## 2020-12-15 NOTE — Transfer of Care (Signed)
Immediate Anesthesia Transfer of Care Note  Patient: Philip Hurst  Procedure(s) Performed: POSTERIOR SPINAL FUSION LUMBAR 2- LUMBAR 3 WITH INSTRUMENTATION AND ALLOGRAFT (N/A )  Patient Location: PACU  Anesthesia Type:General  Level of Consciousness: drowsy  Airway & Oxygen Therapy: Patient Spontanous Breathing and Patient connected to face mask oxygen  Post-op Assessment: Report given to RN and Post -op Vital signs reviewed and stable  Post vital signs: Reviewed and stable  Last Vitals:  Vitals Value Taken Time  BP 125/68 12/15/20 1130  Temp    Pulse 88 12/15/20 1134  Resp 14 12/15/20 1134  SpO2 99 % 12/15/20 1134  Vitals shown include unvalidated device data.  Last Pain:  Vitals:   12/15/20 0437  TempSrc:   PainSc: 5       Patients Stated Pain Goal: 3 (47/84/12 8208)  Complications: No complications documented.

## 2020-12-15 NOTE — Anesthesia Procedure Notes (Signed)
Procedure Name: Intubation Date/Time: 12/15/2020 7:52 AM Performed by: Trinna Post., CRNA Pre-anesthesia Checklist: Patient identified, Emergency Drugs available, Suction available, Patient being monitored and Timeout performed Patient Re-evaluated:Patient Re-evaluated prior to induction Oxygen Delivery Method: Circle system utilized Preoxygenation: Pre-oxygenation with 100% oxygen Induction Type: IV induction Ventilation: Mask ventilation without difficulty and Oral airway inserted - appropriate to patient size Laryngoscope Size: Mac and 4 Grade View: Grade I Tube type: Oral Tube size: 7.5 mm Number of attempts: 1 Airway Equipment and Method: Stylet Placement Confirmation: ETT inserted through vocal cords under direct vision,  positive ETCO2 and breath sounds checked- equal and bilateral Secured at: 21 cm Tube secured with: Tape Dental Injury: Teeth and Oropharynx as per pre-operative assessment

## 2020-12-15 NOTE — Op Note (Signed)
PATIENT NAME: Philip Hurst   MEDICAL RECORD NO.:   [id number]    DATE OF BIRTH: Mar 24, 1941   DATE OF PROCEDURE: 12/15/2020                               OPERATIVE REPORT   PREOPERATIVE DIAGNOSES: 1. Status post L2-3 lateral interbody fusion on 12/14/2020, requiring posterior fusion and instrumentation 2. Status post previous instrumented fusion, L3-4     3. Severe L3-4 spinal stenosis resulting in ongoing left leg pain   POSTOPERATIVE DIAGNOSES: 1. Status post L2-3 lateral interbody fusion on 12/14/2020, requiring posterior fusion and instrumentation 2. Status post previous instrumented fusion, L3-4    3. Severe L3-4 spinal stenosis resulting in ongoing left leg pain   PROCEDURE:   1.  L3-4 decompression, including bilateral partial facetectomy and bilateral lateral recess decompression 2.  Placement of posterior instrumentation, L2, with revision instrumentation placed at L3 3.  Removal of previously placed instrumentation, L4 4.  Use of morselized allograft -- ViviGen.   5.  Intraoperative use of fluoroscopy. 6.  Posterior spinal fusion, L2-3  SURGEON:  Phylliss Bob, MD   ASSISTANT:  Pricilla Holm PA-C.   ANESTHESIA:  General endotracheal anesthesia.   COMPLICATIONS:  None.   DISPOSITION:  Stable.   ESTIMATED BLOOD LOSS:  Minimal.   INDICATIONS:  Briefly, the patient is a very pleasant 80 year old male who is status post a lateral L2-3 fusion procedure, for severe spinal stenosis above a previous fusion at L3-4.  Of note, immediately prior to today's surgery, the patient did describe ongoing pain in his left leg.  I did feel this was very likely secondary to his severe stenosis at L2-3, and I did elect to proceed with a decompression in combination with the instrumented fusion noted above. Please refer to my operative report dated 12/14/2020, for a full account of the patient's indications for surgery today.   DESCRIPTION OF PROCEDURE:  On 12/15/2020 the patient was brought to  surgery and general endotracheal anesthesia was administered.  The patient was then placed prone on a well-padded flat Jackson bed with a spinal frame. At this point, I made a l bilateral paramedian incisions overlying the L2, L3 and L4 pedicles.  I then dissected through the fascia and the paraspinal musculature.  The previously placed instrumentation spanning L3-4 was readily noted.  At this point, the caps were removed, as were the bilateral interconnecting rods and bilateral L3 and L4 pedicle screws.  Bone wax was placed in the L4 holes that remained.  At this point, I did elect to proceed with a decompression at the L2-3 level.  The L2-3 facet joint was noted, and a partial facetectomy was performed.  Abundant overgrowth of the facet joint and ligamentum flavum was identified, compressing the traversing left L3 nerve.  This was thoroughly and adequately decompressed using a high-speed bur in addition to a series of Kerrison punches.  A partial facetectomy was performed on the right side as well, thereby decompressing both the right and left lateral recesses.  I was pleased with the decompression that I was able to accomplish.  At this point, a 7 mm tap was utilized in the L3 pedicle screw hole that remained after removing the previous hardware.  A 7.5 x 40 mm screw was advanced.  I then used Jamshidi's to cannulate the bilateral L2 pedicles.  Guidewires were placed through the Jamshidi's, and a 6 mm tap was  advanced over the guidewires, followed by a 7 x 40 mm screw on the right and left sides.  At this point, the left to 3 facet joint was decorticated, and 1 cc of DBX putty was placed over the decorticated bone to help aid in the success of the fusion.  At this point, interconnecting rods were advanced over the pedicle screws across the L2-3 segment.  Caps were then placed and a final locking procedure was performed.  I was very pleased with the final construct noted on AP and lateral fluoroscopic imaging.   Excellent positioning of the hardware was noted. The wound was copiously irrigated.  The wound was then closed using #1 Vicryl followed by 2-0 Vicryl followed by 4-0 Monocryl.  Benzoin and Steri-Strips were applied over the left lateral wound and left posterior wound, followed by sterile dressing.     Of note, Pricilla Holm was my assistant throughout surgery, and did aid in retraction, placement of the hardware, suctioning, and closure.   Phylliss Bob, MD

## 2020-12-16 MED FILL — Thrombin (Recombinant) For Soln 20000 Unit: CUTANEOUS | Qty: 1 | Status: AC

## 2020-12-16 NOTE — Progress Notes (Signed)
    Patient doing well PO Day 2/1 S/P LAT/POST L2-3 fusion, doing well.  Patient reports resolution of his leg pain that he described prior to surgery yesterday. The patient's back pain is minimal.    Physical Exam: Vitals:   12/16/20 0532 12/16/20 0605  BP: (!) 115/53   Pulse: (!) 116 (!) 105  Resp: 20   Temp: 97.6 F (36.4 C)   SpO2: 97%     Dressing in place, CDI LAT/POST, TLSO at bedside NVI  PO Day 2/1 S/P LAT/POST L2-3 fusion  - up with PT/OT, encourage ambulation - Tramadol for pain, Robaxin for muscle spasms - d/c home today with f/u in 2 weeks

## 2020-12-16 NOTE — Progress Notes (Signed)
Occupational Therapy Evaluation/Discharge  PTA, pt lives with spouse and reports typically Independent with ADLs and mobility without AD. Pt presents now with mild deficits in pain, standing balance and endurance. Educated pt on spinal precautions during ADLs, IADLs and mobility. Initially, pt was unsure of the 3 spinal precautions and required intermittent cues to follow them. Pt overall min guard for bed mobility, Independent for sit to stand from bedside and supervision for short distance mobility without AD. Pt noted to reach out for support during mobility, so use of RW at home encouraged. Pt overall Supervision for UB ADLs and Mod A for LB ADLs (unable to reach feet for dressing/bathing tasks). Encouraged pt to have wife present 24/7 initially to assist with TLSO brace mgmt, shower transfers, LB bathing/dressing and IADLs. Also encouraged use of AE for LB dressing tasks (pt has reacher/sock aide). Anticipate pt to progress well and no further OT services needed. OT to sign off.     12/16/20 0800  OT Visit Information  Last OT Received On 12/16/20  Assistance Needed +1  History of Present Illness Philip Hurst is a 80 y.o. male who presents with ongoing pain in the right greater than left leg. 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 Pt underwent 2-part L2-L3 fusion.  Precautions  Precautions Fall;Back  Precaution Booklet Issued Yes (comment)  Required Braces or Orthoses Spinal Brace  Spinal Brace TLSO  Restrictions  Weight Bearing Restrictions No (Simultaneous filing. User may not have seen previous data.)  Home Living  Family/patient expects to be discharged to: Private residence  Living Arrangements Spouse/significant other  Available Help at Discharge Family;Available 24 hours/day  Type of Home House  Home Access Stairs to enter  Entrance Stairs-Number of Steps 3  Entrance Stairs-Rails Right;Left  Home Layout One level  Bathroom  Shower/Tub Walk-in shower  [place] - 2 wheels;Cane - single point;Shower seat;Grab bars - tub/shower;Grab bars - toilet;Hand held shower head;Scientist, clinical (histocompatibility and immunogenetics)  Prior Function  Level of Independence Independent  Comments Reports independent without AD for mobility, able to complete ADLs, shares IADLs with wife. Wife does 33 of driving, grocery shopping  Communication  Communication No difficulties  Pain Assessment  Pain Assessment 0-10  Pain Score 6  Pain Location back at incision site  Pain Descriptors / Indicators Sore  Pain Intervention(s) Monitored during session;Premedicated before session;Limited activity within patient's tolerance  Cognition  Arousal/Alertness Awake/alert  Behavior During Therapy [facility] for tasks assessed/performed  Overall Cognitive Status Within Functional Limits for tasks assessed  Upper Extremity Assessment  Upper Extremity Assessment Overall WFL for tasks assessed  Lower Extremity Assessment  Lower Extremity Assessment Defer to PT evaluation  Cervical / Trunk Assessment  Cervical / Trunk Assessment Normal  ADL  Overall ADL's  Needs assistance/impaired  Eating/Feeding Set up;Sitting  Grooming Standing;Oral care;Brushing hair;Supervision/safety  Grooming Details (indicate cue type and reason) Cues for avoding bending forward with oral care tasks  Upper Body Bathing Set up;Sitting  Lower Body Bathing Minimal assistance;Sit to/from stand  Upper Body Dressing  Set up;Sitting  Upper Body Dressing Details (indicate cue type and reason) Setup for regular shirt, Max a for brace mgmt  Lower Body Dressing Moderate assistance;Sit to/from stand  Lower Body Dressing Details (indicate cue type and reason) Unable to bring feet to self for task. Assistance to don around B feet to knees and assist for socks. Pt has reacher and  sock aide at home- encouraged pt to use this or to have  assistance from wife  Toilet Transfer Supervision/safety;Ambulation;Comfort height toilet  Toileting- Clothing Manipulation and Hygiene Supervision/safety;Sit to/from stand  Functional mobility during ADLs Supervision/safety  General ADL Comments Pt with minor pain and unsteadiness on feet during short mobility. Encouraged to use RW for longer distances. Educated pt on compensatory strategies for ADLs/IADLs with handout provided  Vision- History  Baseline Vision/History Wears glasses  Wears Glasses At all times  Patient Visual Report No change from baseline  Vision- Assessment  Vision Assessment? No apparent visual deficits  Bed Mobility  Overal bed mobility Needs Assistance  Bed Mobility Rolling;Supine to Sit  Rolling Supervision  Supine to sit Min guard  General bed mobility comments min guard for trunk advancement, use of bed rail and increased time/effort  Transfers  Overall transfer level Needs assistance  Equipment used None  Transfers Sit to/from Stand  Sit to Stand Independent  General transfer comment progressed to independent after initial cues to maintain straight spine  Balance  Overall balance assessment Needs assistance  Sitting-balance support No upper extremity supported;Feet supported  Sitting balance-Leahy Scale Good  Standing balance support No upper extremity supported;During functional activity  Standing balance-Leahy Scale Fair  Standing balance comment fair static standing and short  mobility in room but noted to reach out to wall for support. will need RW for longer distances  OT - End of Session  Equipment Utilized During Treatment Back brace  Activity Tolerance Patient tolerated treatment well  Patient left in bed;Other (comment) (sitting EOB with PT)  Nurse Communication Mobility status  OT Assessment  OT Recommendation/Assessment Patient does not need any further OT services  OT Visit Diagnosis Unsteadiness on feet (R26.81);Other abnormalities of gait  and mobility (R26.89);Pain  Pain - part of body  (back)  OT Problem List Decreased activity tolerance;Impaired balance (sitting and/or standing);Decreased knowledge of precautions;Pain  AM-PAC OT "6 Clicks" Daily Activity Outcome Measure (Version 2)  Help from another person eating meals? 3  Help from another person taking care of personal grooming? 3  Help from another person toileting, which includes using toliet, bedpan, or urinal? 3  Help from another person bathing (including washing, rinsing, drying)? 3  Help from another person to put on and taking off regular upper body clothing? 3  Help from another person to put on and taking off regular lower body clothing? 2  6 Click Score 17  OT Recommendation  Follow Up Recommendations No OT follow up;Supervision/Assistance - 24 hour  OT Equipment None recommended by OT (well equipped)  Acute Rehab OT Goals  Patient Stated Goal pain control  OT Goal Formulation All assessment and education complete, DC therapy  OT Time Calculation  OT Start Time (ACUTE ONLY) 0743  OT Stop Time (ACUTE ONLY) 0807  OT Time Calculation (min) 24 min  OT General Charges  $OT Visit 1 Visit  OT Evaluation  $OT Eval Low Complexity 1 Low  OT Treatments  $Self Care/Home Management  8-22 mins  Written Expression  Dominant Hand Right

## 2020-12-16 NOTE — Evaluation (Signed)
Physical Therapy Evaluation Patient Details Name: Philip Hurst MRN: [id number] DOB: 1941-03-24 Today's Date: 12/16/2020   History of Present Illness  Pt is an 80 y.o. male s/p PLIF. 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  Clinical Impression  PT eval complete. Pt is independent transfers. Supervision provided for ambulation 200' with RW and ascend/descend 3 steps with rail(s). Pt ambulating in room and short distances without AD. Educated on 3/3 back precautions. No follow up PT services indicated. Pt has all needed DME. Plan is for d/c home today. PT signing off.     Follow Up Recommendations No PT follow up    Equipment Recommendations  None recommended by PT    Recommendations for Other Services       Precautions / Restrictions Precautions Precautions: Back Precaution Booklet Issued: Yes (comment) Precaution Comments: Handout in room, provided by OT. Reviewed 3/3 back precautions. Required Braces or Orthoses: Spinal Brace Spinal Brace: Thoracolumbosacral orthotic;Applied in standing position Restrictions Weight Bearing Restrictions: No Other Position/Activity Restrictions: TLSO x 6 weeks when OOB. Don in sit or stand. OK without brace for ambulation to bathroom.      Mobility  Bed Mobility Overal bed mobility: Needs Assistance Bed Mobility: Rolling;Supine to Sit Rolling: Supervision   Supine to sit: Min guard     General bed mobility comments: received OOB    Transfers Overall transfer level: Needs assistance Equipment used: Ambulation equipment used Transfers: Sit to/from Stand Sit to Stand: Independent         General transfer comment: Pt demo safe technique.  Ambulation/Gait Ambulation/Gait assistance: Supervision Gait Distance (Feet): 200 Feet Assistive device: Rolling walker (2 wheeled);None Gait Pattern/deviations: Step-through pattern;Decreased stride length Gait velocity: WFL Gait velocity interpretation: >2.62 ft/sec, indicative of community  ambulatory General Gait Details: Ambulate in room without AD. RW for increased distances.  Stairs Stairs: Yes Stairs assistance: Supervision Stair Management: Two rails;One rail Right;Sideways;Forwards;Step to pattern Number of Stairs: 3 General stair comments: ascend forward with 2 rails, descend sideways with 1 rail on R  Wheelchair Mobility    Modified Rankin (Stroke Patients Only)       Balance Overall balance assessment: Needs assistance Sitting-balance support: No upper extremity supported;Feet supported Sitting balance-Leahy Scale: Good     Standing balance support: No upper extremity supported;During functional activity Standing balance-Leahy Scale: Fair Standing balance comment: fair static standing and short  mobility in room but noted to reach out to wall for support. will need RW for longer distances                             Pertinent Vitals/Pain Pain Assessment: 0-10 Pain Score: 7  Pain Location: back at incision site Pain Descriptors / Indicators: Discomfort;Sore Pain Intervention(s): Monitored during session;Repositioned    Home Living Family/patient expects to be discharged to:: Private residence Living Arrangements: Spouse/significant other Available Help at Discharge: Family;Available 24 hours/day Type of Home: House Home Access: Stairs to enter Entrance Stairs-Rails: Right;Left;Can reach both Entrance Stairs-Number of Steps: 3 Home Layout: One level Home Equipment: Walker - 2 wheels;Cane - single point;Shower seat;Grab bars - tub/shower;Grab bars - toilet;Hand held shower head;Adaptive equipment      Prior Function Level of Independence: Independent         Comments: Reports independent without AD for mobility, able to complete ADLs, shares IADLs with wife. Wife does majority of driving, grocery shopping     Hand Dominance   Dominant Hand: Right  Extremity/Trunk Assessment   Upper Extremity Assessment Upper Extremity  Assessment: Overall WFL for tasks assessed    Lower Extremity Assessment Lower Extremity Assessment: Overall WFL for tasks assessed    Cervical / Trunk Assessment Cervical / Trunk Assessment: Other exceptions Cervical / Trunk Exceptions: s/p PLIF  Communication   Communication: No difficulties  Cognition Arousal/Alertness: Awake/alert Behavior During Therapy: WFL for tasks assessed/performed Overall Cognitive Status: Within Functional Limits for tasks assessed                                        General Comments General comments (skin integrity, edema, etc.): VSS    Exercises     Assessment/Plan    PT Assessment Patent does not need any further PT services  PT Problem List         PT Treatment Interventions      PT Goals (Current goals can be found in the Care Plan section)  Acute Rehab PT Goals Patient Stated Goal: home PT Goal Formulation: All assessment and education complete, DC therapy    Frequency     Barriers to discharge        Co-evaluation               AM-PAC PT "6 Clicks" Mobility  Outcome Measure Help needed turning from your back to your side while in a flat bed without using bedrails?: None Help needed moving from lying on your back to sitting on the side of a flat bed without using bedrails?: None Help needed moving to and from a bed to a chair (including a wheelchair)?: None Help needed standing up from a chair using your arms (e.g., wheelchair or bedside chair)?: None Help needed to walk in hospital room?: A Little Help needed climbing 3-5 steps with a railing? : A Little 6 Click Score: 22    End of Session Equipment Utilized During Treatment: Gait belt;Back brace Activity Tolerance: Patient tolerated treatment well Patient left: in chair;with call bell/phone within reach Nurse Communication: Mobility status PT Visit Diagnosis: Difficulty in walking, not elsewhere classified (R26.2);Pain    Time: 7680-8811 PT  Time Calculation (min) (ACUTE ONLY): 12 min   Charges:   PT Evaluation $PT Eval Moderate Complexity: 1 Mod          Lorrin Goodell, PT  Office # [phone] Pager [phone]   Lorriane Shire 12/16/2020, 8:38 AM

## 2020-12-16 NOTE — Progress Notes (Signed)
Patient is discharged from room 3C03 at this time. Alert and in stable condition. IV site d/c'd and instructions read to patient and spouse with understanding verbalized and all questions answered. Left unit via wheelchair with all belongings at side. ?

## 2020-12-29 NOTE — Discharge Summary (Signed)
Patient ID: Philip Hurst MRN: [id number] DOB/AGE: 81/03/1941 81 y.o.  Admit date: 12/14/2020 Discharge date: 12/16/2020  Admission Diagnoses:  Active Problems:   Radiculopathy   Discharge Diagnoses:  Same  Past Medical History:  Diagnosis Date  . Arthritis    "back, joints in right hand" (08/18/2013)  . CAD (coronary artery disease), single vessel disease with collaterals, medical therapy 09/12/2011  . Chronic lower back pain   . Claudication of left lower extremity, ABI Lt 0.52 -lifestyle limiting  ABI rt 0.90 01/30/2013  . Coronary artery disease    OCCLUDED RCA  . Hyperlipidemia   . Hypertension   . Myocardial infarction ([place])   . Normal cardiac stress test, No ischemia 12/15/12 01/30/2013  . PAD (peripheral artery disease) ([facility])     Surgeries: Procedure(s): LATERAL/POSTERIOR SPINAL FUSION LUMBAR 2- LUMBAR 3 WITH INSTRUMENTATION AND ALLOGRAFT on 12/15/2020   Consultants: none  Discharged Condition: Improved  Hospital Course: Philip Hurst is an 80 y.o. male who was admitted 12/14/2020 for operative treatment of radiculopathy. Patient has severe unremitting pain that affects sleep, daily activities, and work/hobbies. After pre-op clearance the patient was taken to the operating room on 12/15/2020 and underwent  Procedure(s): POSTERIOR SPINAL FUSION LUMBAR 2- LUMBAR 3 WITH INSTRUMENTATION AND ALLOGRAFT.    Patient was given perioperative antibiotics:  Anti-infectives (From admission, onward)   Start     Dose/Rate Route Frequency Ordered Stop   12/15/20 0600  ceFAZolin (ANCEF) IVPB 2g/100 mL premix        2 g 200 mL/hr over 30 Minutes Intravenous On call to O.R. 12/14/20 1630 12/15/20 0810   12/14/20 2100  ceFAZolin (ANCEF) IVPB 2g/100 mL premix        2 g 200 mL/hr over 30 Minutes Intravenous Every 8 hours 12/14/20 1630 12/15/20 1359   12/14/20 0900  ceFAZolin (ANCEF) IVPB 2g/100 mL premix        2 g 200 mL/hr over 30 Minutes Intravenous On call to O.R. 12/14/20 7342  12/14/20 1304       Patient was given sequential compression devices, early ambulation to prevent DVT.  Patient benefited maximally from hospital stay and there were no complications.    Recent vital signs: BP (!) 111/59 (BP Location: Right Arm)   Pulse (!) 108   Temp 97.6 F (36.4 C)   Resp 18   Ht 5\' 3"  (1.6 m)   Wt 93.3 kg   SpO2 96%   BMI 36.44 kg/m    Discharge Medications:   Allergies as of 12/16/2020      Reactions   Plavix [clopidogrel Bisulfate] Itching, Rash      Medication List    TAKE these medications   Acetaminophen 500 MG capsule Take 1,500 mg by mouth in the morning and at bedtime.   atorvastatin 80 MG tablet Commonly known as: LIPITOR TAKE 1 TABLET BY MOUTH DAILY AT SIX IN THE EVENING What changed: See the new instructions.   cetirizine 10 MG tablet Commonly known as: ZYRTEC Take 1 tablet (10 mg total) by mouth at bedtime.   cilostazol 50 MG tablet Commonly known as: PLETAL Take 1 tablet (50 mg total) by mouth 2 (two) times daily.   Entresto 24-26 MG Generic drug: sacubitril-valsartan Take 1 tablet by mouth 2 (two) times daily.   fluticasone 50 MCG/ACT nasal spray Commonly known as: FLONASE Place 1 spray into both nostrils daily.   ketoconazole 2 % shampoo Commonly known as: NIZORAL Apply 1 application topically 2 (two)  times a week.   losartan 100 MG tablet Commonly known as: COZAAR Take 100 mg by mouth daily.   methocarbamol 500 MG tablet Commonly known as: ROBAXIN Take 1 tablet (500 mg total) by mouth every 6 (six) hours as needed for muscle spasms.   tamsulosin 0.4 MG Caps capsule Commonly known as: FLOMAX Take 0.8 mg by mouth daily.     ASK your doctor about these medications   traMADol 50 MG tablet Commonly known as: ULTRAM Take 1-2 tablets (50-100 mg total) by mouth every 6 (six) hours as needed for up to 7 days for moderate pain or severe pain. Ask about: Should I take this medication?       Diagnostic Studies: DG  Lumbar Spine 2-3 Views  Result Date: 12/14/2020 CLINICAL DATA:  L2-L3 XLIF EXAM: DG C-ARM 1-60 MIN; LUMBAR SPINE - 2-3 VIEW FLUOROSCOPY TIME:  Fluoroscopy Time:  1 minutes 25 seconds. Radiation Exposure Index (if provided by the fluoroscopic device): 70.52 mGy COMPARISON:  None. FINDINGS: Two C-arm fluoroscopic images were obtained intraoperatively and submitted for post operative interpretation. These images demonstrate interbody spacer at L2-L3 with redemonstrated L3-L4 PLIF. No evidence of an unexpected foreign body on these images. Please see the performing provider's procedural report for further detail. IMPRESSION: Intraoperative fluoroscopy, as detailed above. Findings discussed with Hadden in the OR at 2:58 p.m. Electronically Signed   By: Margaretha Sheffield MD   On: 12/14/2020 14:59   DG Lumbar Spine Complete  Result Date: 12/15/2020 CLINICAL DATA:  L2-L3 pedicle screws. EXAM: DG C-ARM 1-60 MIN; LUMBAR SPINE - COMPLETE 4+ VIEW FLUOROSCOPY TIME:  Fluoroscopy Time:  3 minutes and 33 seconds. COMPARISON:  None. FINDINGS: Two C-arm fluoroscopic images were obtained intraoperatively and submitted for post operative interpretation. These images demonstrate pedicle screws at L2 and L3 with intervening rods. Interbody spacer at L2-L3. No unexpected findings. Please see the performing provider's procedural report for further detail. IMPRESSION: Intraoperative fluoroscopy, as above. Electronically Signed   By: Margaretha Sheffield MD   On: 12/15/2020 11:04   DG C-Arm 1-60 Min  Result Date: 12/15/2020 CLINICAL DATA:  L2-L3 pedicle screws. EXAM: DG C-ARM 1-60 MIN; LUMBAR SPINE - COMPLETE 4+ VIEW FLUOROSCOPY TIME:  Fluoroscopy Time:  3 minutes and 33 seconds. COMPARISON:  None. FINDINGS: Two C-arm fluoroscopic images were obtained intraoperatively and submitted for post operative interpretation. These images demonstrate pedicle screws at L2 and L3 with intervening rods. Interbody spacer at L2-L3. No unexpected  findings. Please see the performing provider's procedural report for further detail. IMPRESSION: Intraoperative fluoroscopy, as above. Electronically Signed   By: Margaretha Sheffield MD   On: 12/15/2020 11:04   DG C-Arm 1-60 Min  Result Date: 12/14/2020 CLINICAL DATA:  L2-L3 XLIF EXAM: DG C-ARM 1-60 MIN; LUMBAR SPINE - 2-3 VIEW FLUOROSCOPY TIME:  Fluoroscopy Time:  1 minutes 25 seconds. Radiation Exposure Index (if provided by the fluoroscopic device): 70.52 mGy COMPARISON:  None. FINDINGS: Two C-arm fluoroscopic images were obtained intraoperatively and submitted for post operative interpretation. These images demonstrate interbody spacer at L2-L3 with redemonstrated L3-L4 PLIF. No evidence of an unexpected foreign body on these images. Please see the performing provider's procedural report for further detail. IMPRESSION: Intraoperative fluoroscopy, as detailed above. Findings discussed with Bair in the OR at 2:58 p.m. Electronically Signed   By: Margaretha Sheffield MD   On: 12/14/2020 14:59    Disposition: Discharge disposition: 01-Home or Self Care       Discharge Instructions    Discharge  patient   Complete by: As directed    Discharge disposition: 01-Home or Self Care   Discharge patient date: 12/16/2020     PO Day 2/1 S/P LAT/POST L2-3 fusion  - up with PT/OT, encourage ambulation - Tramadol for pain, Robaxin for muscle spasms -Scripts for pain sent to pharmacy electronically  -D/C instructions sheet printed and in chart -D/C today  -F/U in office 2 weeks   Signed: Lennie Muckle Trinaty Bundrick 12/29/2020, 10:07 AM

## 2021-01-24 ENCOUNTER — Encounter: Payer: Self-pay | Admitting: Gastroenterology

## 2021-01-24 ENCOUNTER — Ambulatory Visit: Payer: Medicare Other | Admitting: Gastroenterology

## 2021-01-24 VITALS — BP 134/76 | HR 104 | Ht 63.0 in | Wt 201.4 lb

## 2021-01-24 DIAGNOSIS — R195 Other fecal abnormalities: Secondary | ICD-10-CM

## 2021-01-24 NOTE — Patient Instructions (Signed)
If you are age 81 or older, your body mass index should be between 23-30. Your Body mass index is 35.67 kg/m. If this is out of the aforementioned range listed, please consider follow up with your Primary Care Provider.  You have been scheduled for a colonoscopy. Please follow written instructions given to you at your visit today.  Please pick up your prep supplies at the pharmacy within the next 1-3 days. If you use inhalers (even only as needed), please bring them with you on the day of your procedure.  Due to recent changes in healthcare laws, you may see the results of your imaging and laboratory studies on MyChart before your provider has had a chance to review them.  We understand that in some cases there may be results that are confusing or concerning to you. Not all laboratory results come back in the same time frame and the provider may be waiting for multiple results in order to interpret others.  Please give [place] 48 hours in order for your provider to thoroughly review all the results before contacting the office for clarification of your results.   Thank you for entrusting me with your care and choosing [facility].  Dr Ardis Hughs

## 2021-01-24 NOTE — Progress Notes (Signed)
HPI: This is a very pleasant 80 year old man who was referred to me by Leonard Downing, *  to evaluate Hemoccult positive stool.    He has chronic anemia with hemoglobins from 10-12 or so for at least the past 2 or 3 years that he is aware of.  He recently completed Hemoccult testing was found to be positive for microscopic blood.  He never sees blood in his stool.  He never sees bleeding of any kind from anywhere actually.  His bowels are normal for him, brown, solid without constipation or diarrhea.  Colon cancer does not run in his family.  He has never had colon cancer screening or a colonoscopy that he is aware of.  He had coronary artery disease with a bypass surgery in 2020.  Echocardiogram January 2021 showed normal ejection fraction.  He has chronic renal insufficiency with recent creatinine 1.8, 1.9.  He is on Pletal.  He had a spinal surgery 6 weeks ago and is still recovering from that.  He is wearing a girdle type brace.  Old Data Reviewed: Blood work February 2022 hemoglobin 12.0 MCV 96, normal platelets, creatinine 1.8, INR 1.0,    Review of systems: Pertinent positive and negative review of systems were noted in the above HPI section. All other review negative.   Past Medical History:  Diagnosis Date  . Arthritis    "back, joints in right hand" (08/18/2013)  . CAD (coronary artery disease), single vessel disease with collaterals, medical therapy 09/12/2011  . Chronic lower back pain   . Claudication of left lower extremity, ABI Lt 0.52 -lifestyle limiting  ABI rt 0.90 01/30/2013  . Coronary artery disease    OCCLUDED RCA  . Hyperlipidemia   . Hypertension   . Myocardial infarction ([place])   . Normal cardiac stress test, No ischemia 12/15/12 01/30/2013  . PAD (peripheral artery disease) ([place])     Past Surgical History:  Procedure Laterality Date  . ANTERIOR LAT LUMBAR FUSION Right 12/14/2020   Procedure: RIGHT LATERAL INTERBODY FUSION LUMBAR TWO - LUMBAR  THREE WITH INSTRUMENTATION AND ALLOGRAFT;  Surgeon: Phylliss Bob, MD;  Location: [facility];  Service: Orthopedics;  Laterality: Right;  . BACK SURGERY    . CARDIAC CATHETERIZATION  @2002    1 vessel disease w/collaterals,medical therapy  . CARDIOVASCULAR STRESS TEST  12/07/2008   EF 58%, NO ISCHEMIA  . CATARACT EXTRACTION W/ INTRAOCULAR LENS  IMPLANT, BILATERAL Bilateral   . CORONARY ARTERY BYPASS GRAFT  07/22/2019   Procedure: OFF PUMP CORONARY ARTERY BYPASS GRAFTING (CABG) times two using left internal mammary artery and right leg saphenous vein;  Surgeon: Lajuana Matte, MD;  Location: [facility];  Service: Open Heart Surgery;;  . 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 LEFT HEART CATH AND CORONARY ANGIOGRAPHY N/A 07/19/2019   Procedure: LEFT HEART CATH AND CORONARY ANGIOGRAPHY;  Surgeon: Belva Crome, MD;  Location: [facility];  Service: Cardiovascular;  Laterality: N/A;  . lower extremities arterial doppler  02/16/2013   bilateral ABIs normal values w/o evidence arterial insuff. at rest; left  lower extrem. norm. left popliteal artery stent norm. patency  . LOWER EXTREMITY ANGIOGRAM Left 08/18/2013   Procedure: LOWER EXTREMITY ANGIOGRAM;  Surgeon: Lorretta Harp, MD;  Location: [facility] CATH LAB;  Service: Cardiovascular;  Laterality: Left;  . LUMBAR DISC SURGERY  1990's   "herniated disc" (08/18/2013)  . PERCUTANEOUS STENT INTERVENTION  01/30/2013   Procedure: PERCUTANEOUS STENT INTERVENTION;  Surgeon: Pearletha Forge  Gwenlyn Found, MD;  Location: [facility] CATH LAB;  Service: Cardiovascular;;  . PERIPHERAL VASCULAR CATHETERIZATION N/A 09/05/2015   Procedure: Lower Extremity Angiography;  Surgeon: Lorretta Harp, MD;  Location: [facility];  Service: Cardiovascular;  Laterality: N/A;  . POPLITEAL ARTERY STENT Left 01/30/13  . POPLITEAL ARTERY STENT Left 10/'28/2014   successful Viabahn covered stent placement within the previously placed IDEV  stents/notes 08/18/2013  .  POSTERIOR LUMBAR FUSION  1990's  . TONSILLECTOMY    . WRIST FUSION Right 1990's?   "first OR didn't correct problem" (08/18/2013)    Current Outpatient Medications  Medication Sig Dispense Refill  . Acetaminophen 500 MG capsule Take 1,500 mg by mouth in the morning and at bedtime.    Marland Kitchen atorvastatin (LIPITOR) 80 MG tablet TAKE 1 TABLET BY MOUTH DAILY AT SIX IN THE EVENING 90 tablet 1  . cetirizine (ZYRTEC) 10 MG tablet Take 1 tablet (10 mg total) by mouth at bedtime. 30 tablet 5  . cilostazol (PLETAL) 50 MG tablet Take 1 tablet (50 mg total) by mouth 2 (two) times daily. 180 tablet 3  . fluticasone (FLONASE) 50 MCG/ACT nasal spray Place 1 spray into both nostrils daily. 18.2 g 5  . ketoconazole (NIZORAL) 2 % shampoo Apply 1 application topically 2 (two) times a week.    . losartan (COZAAR) 100 MG tablet Take 100 mg by mouth daily.    . methocarbamol (ROBAXIN) 500 MG tablet Take 1 tablet (500 mg total) by mouth every 6 (six) hours as needed for muscle spasms. 60 tablet 0  . sacubitril-valsartan (ENTRESTO) 24-26 MG Take 1 tablet by mouth 2 (two) times daily. 20 tablet 3  . tamsulosin (FLOMAX) 0.4 MG CAPS capsule Take 0.8 mg by mouth daily.     No current facility-administered medications for this visit.    Allergies as of 01/24/2021 - Review Complete 01/24/2021  Allergen Reaction Noted  . Plavix [clopidogrel bisulfate] Itching and Rash 07/31/2013    Family History  Problem Relation Age of Onset  . Seizures Mother   . Diabetes Mother   . Cancer Father   . Coronary artery disease Brother   . Diabetes Brother   . Lung cancer Brother   . Lung cancer Sister   . Heart attack Maternal Grandmother   . Colon cancer Neg Hx   . Esophageal cancer Neg Hx   . Pancreatic cancer Neg Hx   . Stomach cancer Neg Hx     Social History   Socioeconomic History  . Marital status: Married    Spouse name: [name]  . Number of children: 4  . Years of education: Not on file  . Highest education  level: Not on file  Occupational History  . Not on file  Tobacco Use  . Smoking status: Former Smoker    Packs/day: 1.00    Years: 50.00    Pack years: 50.00    Types: Cigarettes    Quit date: 08/26/1997    Years since quitting: 23.4  . Smokeless tobacco: Former Systems developer    Types: Secondary school teacher  . Vaping Use: Never used  Substance and Sexual Activity  . Alcohol use: Yes    Comment: occ-"very little"  . Drug use: No  . Sexual activity: Never  Other Topics Concern  . Not on file  Social History Narrative  . Not on file   Social Determinants of Health   Financial Resource Strain: Not on file  Food Insecurity: Not on file  Transportation  Needs: Not on file  Physical Activity: Not on file  Stress: Not on file  Social Connections: Not on file  Intimate Partner Violence: Not on file     Physical Exam: BP 134/76 (BP Location: Left Arm, Patient Position: Sitting, Cuff Size: Normal)   Pulse (!) 104   Ht 5\' 3"  (1.6 m)   Wt 201 lb 6 oz (91.3 kg)   BMI 35.67 kg/m  Constitutional: generally well-appearing Psychiatric: alert and oriented x3 Eyes: extraocular movements intact Mouth: oral pharynx moist, no lesions Neck: supple no lymphadenopathy Cardiovascular: heart regular rate and rhythm Lungs: clear to auscultation bilaterally Abdomen: soft, nontender, nondistended, no obvious ascites, no peritoneal signs, normal bowel sounds Extremities: no lower extremity edema bilaterally Skin: no lesions on visible extremities   Assessment and plan: 80 y.o. male with Hemoccult positive stool, chronic anemia, not microcytic  His anemia is probably multifactorial.  Chronic renal sufficiency likely plays a role.  He is also Hemoccult positive.  He has never had a colonoscopy and I recommended that he consider that now to evaluate his Hemoccult positive stools.  He would like to wait several more weeks to more fully recover from his spinal surgery and I certainly think that is reasonable.   He takes Pletal and he does not need to stop that for the colonoscopy.  I see no reason for any further blood tests or imaging studies prior to then.    Please see the "Patient Instructions" section for addition details about the plan.   Owens Loffler, MD [facility] 01/24/2021, 11:02 AM  Cc: Leonard Downing, *  Total time on date of encounter was 45  minutes (this included time spent preparing to see the patient reviewing records; obtaining and/or reviewing separately obtained history; performing a medically appropriate exam and/or evaluation; counseling and educating the patient and family if present; ordering medications, tests or procedures if applicable; and documenting clinical information in the health record).

## 2021-03-27 ENCOUNTER — Other Ambulatory Visit: Payer: Self-pay | Admitting: Cardiovascular Disease

## 2021-04-11 ENCOUNTER — Encounter: Payer: Medicare Other | Admitting: Gastroenterology

## 2021-04-11 ENCOUNTER — Telehealth: Payer: Self-pay | Admitting: Gastroenterology

## 2021-05-25 ENCOUNTER — Other Ambulatory Visit: Payer: Self-pay

## 2021-05-25 ENCOUNTER — Ambulatory Visit (AMBULATORY_SURGERY_CENTER): Payer: Medicare Other | Admitting: *Deleted

## 2021-05-25 VITALS — Ht 63.0 in | Wt 190.0 lb

## 2021-05-25 DIAGNOSIS — R195 Other fecal abnormalities: Secondary | ICD-10-CM

## 2021-05-25 NOTE — Progress Notes (Signed)
Pt's previsit is done over the phone and all paperwork (prep instructions, blank consent form to just read over) sent to patient.  Pt's name and DOB verified at the beginning of the previsit.  Pt denies any difficulty with ambulating.    No trouble with anesthesia, denies being told they were difficult to intubate, or hx/fam hx of malignant hyperthermia per pt   No egg or soy allergy  No home oxygen use   No medications for weight loss taken  Pt denies constipation issues

## 2021-05-30 NOTE — Progress Notes (Signed)
Cardiology Clinic Note   Patient Name: Philip Hurst Date of Encounter: 06/01/2021  Primary Care Provider:  Leonard Downing, MD Primary Cardiologist:  Philip Burow, MD  Patient Profile    Philip Hurst 80 year old male presents today for  follow-up evaluation of his hypertension and CAD.  Past Medical History    Past Medical History:  Diagnosis Date   Arthritis    "back, joints in right hand" (08/18/2013)   CAD (coronary artery disease), single vessel disease with collaterals, medical therapy 09/12/2011   Cataract    Chronic lower back pain    Claudication of left lower extremity, ABI Lt 0.52 -lifestyle limiting  ABI rt 0.90 01/30/2013   Clotting disorder ([place])    Coronary artery disease    OCCLUDED RCA   Hyperlipidemia    Hypertension    Myocardial infarction [facility])    November 2021   Normal cardiac stress test, No ischemia 12/15/12 01/30/2013   PAD (peripheral artery disease) (H[facility])    Past Surgical History:  Procedure Laterality Date   ANTERIOR LAT LUMBAR FUSION Right 12/14/2020   Procedure: RIGHT LATERAL INTERBODY FUSION LUMBAR TWO - LUMBAR THREE WITH INSTRUMENTATION AND ALLOGRAFT;  Surgeon: Philip Bob, MD;  Location: [place];  Service: Orthopedics;  Laterality: Right;   BACK SURGERY     CARDIAC CATHETERIZATION  '@2002'$    1 vessel disease w/collaterals,medical therapy   CARDIOVASCULAR STRESS TEST  12/07/2008   EF 58%, NO ISCHEMIA   CATARACT EXTRACTION W/ INTRAOCULAR LENS  IMPLANT, BILATERAL Bilateral    CORONARY ARTERY BYPASS GRAFT  07/22/2019   Procedure: OFF PUMP CORONARY ARTERY BYPASS GRAFTING (CABG) times two using left internal mammary artery and right leg saphenous vein;  Surgeon: Philip Matte, MD;  Location: [facility] OR;  Service: Open Heart Surgery;;   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 at rest; left  lower extrem. norm. left popliteal artery stent norm. patency   LOWER EXTREMITY ANGIOGRAM Left 08/18/2013   Procedure: LOWER EXTREMITY ANGIOGRAM;  Surgeon: Philip Harp, MD;  Location: [facility] CATH LAB;  Service: Cardiovascular;  Laterality: Left;   LUMBAR DISC SURGERY  1990's   "herniated disc" (08/18/2013)   PERCUTANEOUS STENT INTERVENTION  01/30/2013   Procedure: PERCUTANEOUS STENT INTERVENTION;  Surgeon: Philip Harp, MD;  Location: [facility] CATH LAB;  Service: Cardiovascular;;   PERIPHERAL VASCULAR CATHETERIZATION N/A 09/05/2015   Procedure: Lower Extremity Angiography;  Surgeon: Philip Harp, MD;  Location: [place] [facility];  Service: Cardiovascular;  Laterality: N/A;   POPLITEAL ARTERY STENT Left 01/30/13   POPLITEAL ARTERY STENT Left 10/'28/2014   successful Viabahn covered stent placement within the previously placed IDEV  stents/notes 08/18/2013   POSTERIOR LUMBAR FUSION  1990's   TONSILLECTOMY     WRIST FUSION Right 1990's?   "first OR didn't correct problem" (08/18/2013)    Allergies  Allergies  Allergen Reactions   Plavix [Clopidogrel Bisulfate] Itching and Rash    History of Present Illness    Philip Hurst has a past medical history of hypertension, coronary artery disease (CABG x2 06/2019, LIMA-LAD, SVG-PDA.), peripheral arterial disease acute systolic heart failure, STEMI, ischemic cardiomyopathy, AKI, and hyportension.  He had a normal stress test 12/15/2012 which was nonischemic.  Dr. Gwenlyn Hurst recently performed lower extremity angiogram revealing an occluded popliteal artery in the left lower extremity.  His right popliteal artery was normal.  He also had widely patent renal arteries at that time.  He had a successful balloon angioplasty and stenting of the occluded segment using a  chocolate balloon.(01/2013)  He was seen in follow-up to this procedure and was doing well.  His claudication had resolved.  He had also begun to walk regularly at that time.  This ultimately occluded and was treated with a Viabahn stent which also occluded.  He was then sent to Dr. Trula Hurst for consideration of fitting-to have bypass however medical treatment was recommended.   He was admitted to the hospital on 07/16/2019 pneumonia and sepsis.  He had positive enzymes at that time and EKG changes.  He underwent cardiac catheterization by Dr. Tamala Hurst which showed an occluded dominant RCA with left to right collaterals, high-grade proximal LAD and obtuse marginal branch disease.  He underwent CABG x2 by Philip Hurst 06/2019 with LIMA to LAD and SVG to PDA.  His EF was 25% at that time and has returned to normal with his echocardiogram 10/27/2019.   He was seen by Dr. Gwenlyn Hurst on 10/29/2018 during that time he was hypertensive but had not taken his blood pressure medications.  He denied chest pain, shortness of breath,and    He called nurse triage line  11/12/2019 and stated that his blood pressures been elevated for several weeks now.  140s-150s over 100s-116's.  He stated he has been taking his blood pressure after his medications including carvedilol 12.5 Entresto 24-26, and spironolactone 12.5 daily.   He presented the clinic 09/11/2020 and stated he had been eating Campbell soup regularly.  He stated that he was also noticing some weakness in his legs around the same time as he noticed his blood pressure had been elevated.  I  gave him the salty 6 sheet and increased his carvedilol to 25 mg twice daily.  I  educated on sodium reduction.   He followed up with Dr. Gwenlyn Hurst on 12/15/2019 and denied chest pain or shortness of breath.  He had mild left calf claudication with exercise.  It was decided that he would be medically managed.  His blood pressure was much better controlled at 110/72.   He presented to the clinic  06/13/2020 for follow-up evaluation and stated he felt well.  He denied chest pain lower extremity edema, and syncopal events.  He stated that he did have some weakness in his lower extremities but was limited in his physical activity due to his back pain.  He had tolerated his medication changes well and his blood pressures were much better controlled.  He also complained of a cough that he has seen his PCP for.  He was prescribed Flonase which  helped somewhat.  Lungs were clear to auscultation on exam.  I have instructed him to try over-the-counter Zyrtec, use a humidifier, and change HVAC filters.  I instructed him to use a cane with walking as an extra base of support.  I  gave  the salty 6 diet sheet and had him follow-up with Dr. Gwenlyn Hurst or myself in 6 months.  He was seen by Dr. Gwenlyn Hurst on 11/29/2020.  During that time he complained of left calf claudication that was lifestyle limiting.  He had been diagnosed with spinal stenosis and was seen Dr. Lynann Bologna  to discuss surgical options.  He denied chest pain shortness of breath.  He underwent lumbar fusion 12/15/2020.  He presents to the clinic today for follow-up evaluation states he feels fairly well.  He is doing physical therapy 2 days a week.  He does notice some weakness in his lower extremities but feels it is improving with his physical therapy.  He reports left calf claudication when he walks 4-5 blocks.  This is stable/unchanged from his previous visit.  He asks if increasing his Pletal would improve his symptoms.  He continues to eat a heart healthy diet and denies chest pain shortness of breath.  I will reach out to Dr. Gwenlyn Hurst about patient's blood Pletal medication, have him continue heart healthy low-sodium high-fiber diet, increase his physical activity as tolerated, and follow-up in 6 months.  We will also repeat his fasting lipids and LFTs.   Today he denies chest pain, shortness of breath, lower extremity edema, fatigue, palpitations, melena,  hematuria, hemoptysis, diaphoresis, weakness, presyncope, syncope, orthopnea, and PND.  Home Medications    Prior to Admission medications   Medication Sig Start Date End Date Taking? Authorizing Provider  Acetaminophen 500 MG capsule Take 1,500 mg by mouth in the morning and at bedtime.    [provider]  aspirin EC 81 MG tablet Take 81 mg by mouth daily. Swallow whole.    [provider]  atorvastatin (LIPITOR) 80 MG tablet TAKE 1 TABLET BY MOUTH DAILY AT SIX IN THE EVENING 03/27/21   Philip Harp, MD  cetirizine (ZYRTEC) 10 MG tablet Take 1 tablet (10 mg total) by mouth at bedtime. 11/14/20   Spero Geralds, MD  cilostazol (PLETAL) 50 MG tablet Take 1 tablet (50 mg total) by mouth 2 (two) times daily. 11/29/20   Philip Harp, MD  fluticasone (FLONASE) 50 MCG/ACT nasal spray Place 1 spray into both nostrils daily. 11/14/20   Spero Geralds, MD  ketoconazole (NIZORAL) 2 % shampoo Apply 1 application topically 2 (two) times a week. 10/28/20   [provider]  losartan (COZAAR) 100 MG tablet Take 100 mg by mouth daily. 10/26/20   [provider]  methocarbamol (ROBAXIN) 500 MG tablet Take 1 tablet (500 mg total) by mouth every 6 (six) hours as needed for muscle spasms. Patient not taking: Reported on 05/25/2021 12/15/20   Justice Britain, PA-C  sacubitril-valsartan (ENTRESTO) 24-26 MG Take 1 tablet by mouth 2 (two) times daily. 04/28/20   Philip Harp, MD  tamsulosin (FLOMAX) 0.4 MG CAPS capsule Take 0.8 mg by mouth daily. 10/26/20   [provider]    Family History    Family History  Problem Relation Age of Onset   Seizures Mother    Diabetes Mother    Cancer Father    Coronary artery disease Brother    Diabetes Brother    Lung cancer Brother    Lung cancer Sister    Heart attack Maternal Grandmother    Colon cancer Neg Hx    Esophageal cancer Neg Hx    Pancreatic cancer Neg Hx    Stomach cancer Neg Hx    He indicated that his  mother is deceased. He indicated that his father is deceased. He indicated that the status of his sister is unknown. He indicated that the status of his brother is unknown. He indicated that the status of his maternal grandmother is unknown. He indicated that the status of his neg hx is unknown.  Social History    Social History  Socioeconomic History   Marital status: Married    Spouse name: [name]   Number of children: 4   Years of education: Not on file   Highest education level: Not on file  Occupational History   Not on file  Tobacco Use   Smoking status: Former    Packs/day: 1.00    Years: 50.00    Pack years: 50.00    Types: Cigarettes    Quit date: 08/26/1997    Years since quitting: 23.7   Smokeless tobacco: Former    Types: Nurse, children's Use: Never used  Substance and Sexual Activity   Alcohol use: Yes    Comment: occ-"very little"   Drug use: No   Sexual activity: Never  Other Topics Concern   Not on file  Social History Narrative   Not on file   Social Determinants of Health   Financial Resource Strain: Not on file  Food Insecurity: Not on file  Transportation Needs: Not on file  Physical Activity: Not on file  Stress: Not on file  Social Connections: Not on file  Intimate Partner Violence: Not on file     Review of Systems    General:  No chills, fever, night sweats or weight changes.  Cardiovascular:  No chest pain, dyspnea on exertion, edema, orthopnea, palpitations, paroxysmal nocturnal dyspnea. Dermatological: No rash, lesions/masses Respiratory: No cough, dyspnea Urologic: No hematuria, dysuria Abdominal:   No nausea, vomiting, diarrhea, bright red blood per rectum, melena, or hematemesis Neurologic:  No visual changes, wkns, changes in mental status. All other systems reviewed and are otherwise negative except as noted above.  Physical Exam    VS:  BP 126/72   Pulse 96   Resp 18   Ht '5\' 3"'$  (1.6 m)   Wt 193 lb 6.4 oz (87.7  kg)   SpO2 91%   BMI 34.26 kg/m  , BMI Body mass index is 34.26 kg/m. GEN: Well nourished, well developed, in no acute distress. HEENT: normal. Neck: Supple, no JVD, carotid bruits, or masses. Cardiac: RRR, no murmurs, rubs, or gallops. No clubbing, cyanosis, edema.  Radials/DP/PT 2+ and equal bilaterally.  Respiratory:  Respirations regular and unlabored, clear to auscultation bilaterally. GI: Soft, nontender, nondistended, BS + x 4. MS: no deformity or atrophy. Skin: warm and dry, no rash. Neuro:  Strength and sensation are intact. Psych: Normal affect.  Accessory Clinical Findings    Recent Labs: 12/12/2020: ALT 23; Hemoglobin 12.0; Platelets 229 12/14/2020: BUN 35; Creatinine, Ser 1.70; Potassium 4.0; Sodium 137   Recent Lipid Panel    Component Value Date/Time   CHOL 100 12/11/2019 1133   TRIG 96 12/11/2019 1133   HDL 44 12/11/2019 1133   CHOLHDL 2.3 12/11/2019 1133   LDLCALC 38 12/11/2019 1133    ECG personally reviewed by me today-sinus rhythm with first-degree AV block with premature supraventricular complexes 96 bpm EKG 06/13/2020 sinus bradycardia with first-degree AV block 59 bpm- No acute changes   EKG 08/13/2019 Sinus rhythm with first-degree AV block T wave abnormality consider anterior lateral ischemia.  67 bpm   Cardiac catheterization 123456  Acute systolic heart failure with precipitous drop in LVEF with anteroapical wall motion abnormality likely related to acute ischemia.  Cannot totally exclude the possibility of a stress cardiomyopathy component.  LVEDP is 20 mmHg. Severe coronary artery disease with segmental 70 to 90% proximal to mid LAD stenosis. 60% second obtuse marginal.  The first marginal is small. Total occlusion, flush, RCA  with large collateral bed from left circumflex to distal left ventricular branch.   RECOMMENDATIONS:   IV heparin IV nitroglycerin Gentle diuresis but being careful to monitor kidney function.  He has been exposed  to contrast on top of acute kidney injury present on admission.  50 cc of contrast used. Needs surgical consultation to consider bypass grafting.  Ultimately, high risk PCI with LAD atherectomy and stenting using hemodynamic support would be a consideration although with known PAD, support may not be possible.  Diagnostic Dominance: Right    Intervention    Echocardiogram 10/27/2019 IMPRESSIONS      1. Left ventricular ejection fraction, by visual estimation, is 55 to 60%. The left ventricle has normal function. There is moderately increased left ventricular hypertrophy.  2. Left ventricular diastolic parameters are consistent with Grade I diastolic dysfunction (impaired relaxation).  3. The left ventricle has no regional wall motion abnormalities.  4. Global right ventricle has normal systolic function.The right ventricular size is normal. No increase in right ventricular wall thickness.  5. Left atrial size was normal.  6. Right atrial size was normal.  7. The mitral valve is normal in structure. Mild mitral valve regurgitation. No evidence of mitral stenosis.  8. The tricuspid valve is normal in structure.  9. The aortic valve is normal in structure. Aortic valve regurgitation is not visualized. Mild to moderate aortic valve sclerosis/calcification without any evidence of aortic stenosis. 10. The pulmonic valve was normal in structure. Pulmonic valve regurgitation is trivial. 11. The inferior vena cava is normal in size with greater than 50% respiratory variability, suggesting right atrial pressure of 3 mmHg.  Assessment & Plan   1.  Essential hypertension-BP today 126/72.  Well-controlled at home. Continue carvedilol,  Entresto, spironolactone  Heart healthy low-sodium diet-salty 6 given Increase physical activity as tolerated   PAD-continues with left calf claudication.  Previously was to repeat referred to Dr. Trula Hurst for consideration of surgical revascularization.  He did not  feel it would be a long-term durable procedure and medical management was recommended. Continue Pletal, atorvastatin Heart healthy low-sodium high-fiber diet Will reach out to Dr. Gwenlyn Hurst about Pletal up titration  Coronary artery disease-denies chest pain.  No recent events of chest pain, arm neck or back discomfort Continue aspirin,Carvedilol, atorvastatin  Heart healthy low-sodium diet Increase physical activity as tolerated   Acute systolic heart failure/ischemic cardiomyopathy-Euvolemic today.no increased DOE or activity intolerance.  CABG x2 06/2019, LIMA-LAD, SVG-PDA.  Echocardiogram 10/27/2019 showed EF 55 to 60%.   Continue Entresto 25-26, carvedilol, spironolactone Heart healthy low-sodium diet Maintain physical activity  Hyperlipidemia-LDL goal below 70.  LDL 38 on 12/11/2019. Atorvastatin 80 mg daily Heart healthy low-sodium high-fiber diet Increase physical activity as tolerated Order lipid panel  In one month fasting Repeat fasting lipids and LFTs    Disposition: Follow-up with Dr. Gwenlyn Hurst in 6 months.   Jossie Ng. Brock Larmon NP-C    06/01/2021, 4:03 PM [facility] [place] [address] Office [phone] Fax [phone]  Notice: This dictation was prepared with Dragon dictation along with smaller phrase technology. Any transcriptional errors that result from this process are unintentional and may not be corrected upon review.  I spent 14 minutes examining this patient, reviewing medications, and using patient centered shared decision making involving her cardiac care.  Prior to her visit I spent greater than 20 minutes reviewing her past medical history,  medications, and prior cardiac tests.

## 2021-06-01 ENCOUNTER — Ambulatory Visit: Payer: Medicare Other | Admitting: General Practice

## 2021-06-01 ENCOUNTER — Encounter: Payer: Self-pay | Admitting: General Practice

## 2021-06-01 ENCOUNTER — Other Ambulatory Visit: Payer: Self-pay

## 2021-06-01 VITALS — BP 126/72 | HR 96 | Resp 18 | Ht 63.0 in | Wt 193.4 lb

## 2021-06-01 DIAGNOSIS — I255 Ischemic cardiomyopathy: Secondary | ICD-10-CM

## 2021-06-01 DIAGNOSIS — E785 Hyperlipidemia, unspecified: Secondary | ICD-10-CM

## 2021-06-01 DIAGNOSIS — I2510 Atherosclerotic heart disease of native coronary artery without angina pectoris: Secondary | ICD-10-CM | POA: Diagnosis not present

## 2021-06-01 DIAGNOSIS — I10 Essential (primary) hypertension: Secondary | ICD-10-CM | POA: Diagnosis not present

## 2021-06-01 DIAGNOSIS — I739 Peripheral vascular disease, unspecified: Secondary | ICD-10-CM

## 2021-06-01 DIAGNOSIS — Z79899 Other long term (current) drug therapy: Secondary | ICD-10-CM

## 2021-06-01 DIAGNOSIS — I5021 Acute systolic (congestive) heart failure: Secondary | ICD-10-CM | POA: Diagnosis not present

## 2021-06-01 NOTE — Patient Instructions (Signed)
Medication Instructions:  The current medical regimen is effective;  continue present plan and medications as directed. Please refer to the Current Medication list given to you today.   *If you need a refill on your cardiac medications before your next appointment, please call your pharmacy*  Lab Work:    FASTING LIPID AND LFT WHEN FASTING.      Special Instructions READ AND FOLLOW INCREASED FIBER DIET-ATTACHED  Follow-Up: Your next appointment:  6 month(s) In Person with Quay Burow, MD   At [facility], you and your health needs are our priority.  As part of our continuing mission to provide you with exceptional heart care, we have created designated Provider Care Teams.  These Care Teams include your primary Cardiologist (physician) and Advanced Practice Providers (APPs -  Physician Assistants and Nurse Practitioners) who all work together to provide you with the care you need, when you need it.    High-Fiber Eating Plan Fiber, also called dietary fiber, is a type of carbohydrate. It is found foods such as fruits, vegetables, whole grains, and beans. A high-fiber diet can have many health benefits. Your health care provider may recommend a high-fiber diet to help: Prevent constipation. Fiber can make your bowel movements more regular. Lower your cholesterol. Relieve the following conditions: Inflammation of veins in the anus (hemorrhoids). Inflammation of specific areas of the digestive tract (uncomplicated diverticulosis). A problem of the large intestine, also called the colon, that sometimes causes pain and diarrhea (irritable bowel syndrome, or IBS). Prevent overeating as part of a weight-loss plan. Prevent heart disease, type 2 diabetes, and certain cancers. What are tips for following this plan? Reading food labels  Check the nutrition facts label on food products for the amount of dietary fiber. Choose foods that have 5 grams of fiber or more per serving. The goals for  recommended daily fiber intake include: Men (age 21 or younger): 34-38 g. Men (over age 73): 28-34 g. Women (age 79 or younger): 25-28 g. Women (over age 53): 22-25 g. Your daily fiber goal is _____________ g. Shopping Choose whole fruits and vegetables instead of processed forms, such as apple juice or applesauce. Choose a wide variety of high-fiber foods such as avocados, lentils, oats, and kidney beans. Read the nutrition facts label of the foods you choose. Be aware of foods with added fiber. These foods often have high sugar and sodium amounts per serving. Cooking Use whole-grain flour for baking and cooking. Cook with brown rice instead of white rice. Meal planning Start the day with a breakfast that is high in fiber, such as a cereal that contains 5 g of fiber or more per serving. Eat breads and cereals that are made with whole-grain flour instead of refined flour or white flour. Eat brown rice, bulgur wheat, or millet instead of white rice. Use beans in place of meat in soups, salads, and pasta dishes. Be sure that half of the grains you eat each day are whole grains. General information You can get the recommended daily intake of dietary fiber by: Eating a variety of fruits, vegetables, grains, nuts, and beans. Taking a fiber supplement if you are not able to take in enough fiber in your diet. It is better to get fiber through food than from a supplement. Gradually increase how much fiber you consume. If you increase your intake of dietary fiber too quickly, you may have bloating, cramping, or gas. Drink plenty of water to help you digest fiber. Choose high-fiber snacks, such  as berries, raw vegetables, nuts, and popcorn. What foods should I eat? Fruits Berries. Pears. Apples. Oranges. Avocado. Prunes and raisins. Dried figs. Vegetables Sweet potatoes. Spinach. Kale. Artichokes. Cabbage. Broccoli. Cauliflower.Green peas. Carrots. Squash. Grains Whole-grain breads. Multigrain  cereal. Oats and oatmeal. Brown rice. Barley.Bulgur wheat. [place]. Quinoa. Bran muffins. Popcorn. Rye wafer crackers. Meats and other proteins Navy beans, kidney beans, and pinto beans. Soybeans. Split peas. Lentils. Nutsand seeds. Dairy Fiber-fortified yogurt. Beverages Fiber-fortified soy milk. Fiber-fortified orange juice. Other foods Fiber bars. The items listed above may not be a complete list of recommended foods and beverages. Contact a dietitian for more information. What foods should I avoid? Fruits Fruit juice. Cooked, strained fruit. Vegetables Fried potatoes. Canned vegetables. Well-cooked vegetables. Grains White bread. Pasta made with refined flour. White rice. Meats and other proteins Fatty cuts of meat. Fried chicken or fried fish. Dairy Milk. Yogurt. Cream cheese. Sour cream. Fats and oils Butters. Beverages Soft drinks. Other foods Cakes and pastries. The items listed above may not be a complete list of foods and beverages to avoid. Talk with your dietitian about what choices are best for you. Summary Fiber is a type of carbohydrate. It is found in foods such as fruits, vegetables, whole grains, and beans. A high-fiber diet has many benefits. It can help to prevent constipation, lower blood cholesterol, aid weight loss, and reduce your risk of heart disease, diabetes, and certain cancers. Increase your intake of fiber gradually. Increasing fiber too quickly may cause cramping, bloating, and gas. Drink plenty of water while you increase the amount of fiber you consume. The best sources of fiber include whole fruits and vegetables, whole grains, nuts, seeds, and beans. This information is not intended to replace advice given to you by your health care provider. Make sure you discuss any questions you have with your healthcare provider. Document Revised: 02/11/2020 Document Reviewed: 02/11/2020 Elsevier Patient Education  2022 [employer].

## 2021-06-07 ENCOUNTER — Encounter: Payer: Medicare Other | Admitting: Gastroenterology

## 2021-06-07 ENCOUNTER — Telehealth: Payer: Self-pay | Admitting: Gastroenterology

## 2021-06-07 ENCOUNTER — Other Ambulatory Visit: Payer: Self-pay | Admitting: Internal Medicine

## 2021-06-07 DIAGNOSIS — J310 Chronic rhinitis: Secondary | ICD-10-CM

## 2021-06-07 NOTE — Telephone Encounter (Signed)
This is the second time that he has forgotten about the procedure in about 6 weeks.  Please cancel his upcoming case and instead offer him an office visit as follow-up.  I just would like to make sure that he and his wife are really interested in going ahead with this.

## 2021-06-07 NOTE — Telephone Encounter (Signed)
Hey Dr. Ardis Hughs,   Inbound call from patient to cancel procedure today 8/17 @ 4pm. Patient states he got the dates mixed up and have not took the prep to come in. Have rescheduled for 06/16/21.  Thank you

## 2021-06-07 NOTE — Telephone Encounter (Signed)
OV scheduled 10/25.

## 2021-06-16 ENCOUNTER — Encounter: Payer: Medicare Other | Admitting: Gastroenterology

## 2021-07-07 LAB — HEPATIC FUNCTION PANEL
ALT: 16 IU/L (ref 0–44)
AST: 16 IU/L (ref 0–40)
Albumin: 4.6 g/dL (ref 3.7–4.7)
Alkaline Phosphatase: 102 IU/L (ref 44–121)
Bilirubin Total: <0.2 mg/dL (ref 0.0–1.2)
Bilirubin, Direct: <0.1 mg/dL (ref 0.00–0.40)
Total Protein: 6.9 g/dL (ref 6.0–8.5)

## 2021-07-07 LAB — LIPID PANEL
Chol/HDL Ratio: 2.9 ratio (ref 0.0–5.0)
Cholesterol, Total: 120 mg/dL (ref 100–199)
HDL: 42 mg/dL (ref >39)
LDL Chol Calc (NIH): 57 mg/dL (ref 0–99)
Triglycerides: 113 mg/dL (ref 0–149)
VLDL Cholesterol Cal: 21 mg/dL (ref 5–40)

## 2021-07-14 ENCOUNTER — Encounter: Payer: Medicare Other | Admitting: Gastroenterology

## 2021-08-15 ENCOUNTER — Other Ambulatory Visit (INDEPENDENT_AMBULATORY_CARE_PROVIDER_SITE_OTHER): Payer: Medicare Other

## 2021-08-15 ENCOUNTER — Ambulatory Visit: Payer: Medicare Other | Admitting: Gastroenterology

## 2021-08-15 ENCOUNTER — Encounter: Payer: Self-pay | Admitting: Gastroenterology

## 2021-08-15 DIAGNOSIS — D649 Anemia, unspecified: Secondary | ICD-10-CM

## 2021-08-15 DIAGNOSIS — R194 Change in bowel habit: Secondary | ICD-10-CM | POA: Diagnosis not present

## 2021-08-15 LAB — CBC
HCT: 32.6 % — ABNORMAL LOW (ref 39.0–52.0)
Hemoglobin: 11 g/dL — ABNORMAL LOW (ref 13.0–17.0)
MCHC: 33.6 g/dL (ref 30.0–36.0)
MCV: 87.7 fl (ref 78.0–100.0)
Platelets: 247 10*3/uL (ref 150.0–400.0)
RBC: 3.72 Mil/uL — ABNORMAL LOW (ref 4.22–5.81)
RDW: 17.5 % — ABNORMAL HIGH (ref 11.5–15.5)
WBC: 9.5 10*3/uL (ref 4.0–10.5)

## 2021-08-15 MED ORDER — CITRUCEL PO POWD: 1.0000 | Freq: Every day | ORAL | Status: AC

## 2021-08-15 MED ORDER — NA SULFATE-K SULFATE-MG SULF 17.5-3.13-1.6 GM/177ML PO SOLN: 1.0000 | ORAL | 0 refills | Status: DC

## 2021-08-15 NOTE — Progress Notes (Signed)
HPI: This is a very pleasant 80 year old man  I last saw him about 8 months ago as a new patient.  He was referred by his primary care physician to evaluate Hemoccult positive stool.  Blood work around then showed hemoglobin 12, MCV 96, normal platelets, creatinine 1.8.  I felt that his anemia is probably multifactorial including chronic renal insufficiency.  He was also Hemoccult positive and I recommended a colonoscopy at his soonest convenience.  He wanted to wait to recover more from her recent spinal surgery.  Eventually the procedure was scheduled and then he either forgot or canceled 2 or 3 times in a row and we asked that he come back in here to the office to discuss further.  Today he tells me that he just forgot a couple times.  He is very interested in going ahead with a colonoscopy.  He is actually noticed a change in his bowel habits recently.  He has darker and sticky stools.  They are not black.  He has noticed some mild fecal incontinence, cannot seem to get clean after bowel movements.  Overall his weight is stable.    ROS: complete GI ROS as described in HPI, all other review negative.  Constitutional:  No unintentional weight loss   Past Medical History:  Diagnosis Date   Arthritis    "back, joints in right hand" (08/18/2013)   CAD (coronary artery disease), single vessel disease with collaterals, medical therapy 09/12/2011   Cataract    Chronic lower back pain    Claudication of left lower extremity, ABI Lt 0.52 -lifestyle limiting  ABI rt 0.90 01/30/2013   Clotting disorder ([place])    Coronary artery disease    OCCLUDED RCA   Hyperlipidemia    Hypertension    Myocardial infarction [facility])    November 2021   Normal cardiac stress test, No ischemia 12/15/12 01/30/2013   PAD (peripheral artery disease) (HCC)     Past Surgical History:  Procedure Laterality Date   ANTERIOR LAT LUMBAR FUSION Right 12/14/2020   Procedure: RIGHT LATERAL INTERBODY FUSION LUMBAR TWO -  LUMBAR THREE WITH INSTRUMENTATION AND ALLOGRAFT;  Surgeon: Phylliss Bob, MD;  Location: [place];  Service: Orthopedics;  Laterality: Right;   BACK SURGERY     CARDIAC CATHETERIZATION  @2002    1 vessel disease w/collaterals,medical therapy   CARDIOVASCULAR STRESS TEST  12/07/2008   EF 58%, NO ISCHEMIA   CATARACT EXTRACTION W/ INTRAOCULAR LENS  IMPLANT, BILATERAL Bilateral    CORONARY ARTERY BYPASS GRAFT  07/22/2019   Procedure: OFF PUMP CORONARY ARTERY BYPASS GRAFTING (CABG) times two using left internal mammary artery and right leg saphenous vein;  Surgeon: Lajuana Matte, MD;  Location: [place] OR;  Service: Open Heart Surgery;;   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 at rest; left  lower extrem. norm. left popliteal artery stent norm. patency   LOWER EXTREMITY ANGIOGRAM Left 08/18/2013   Procedure: LOWER EXTREMITY ANGIOGRAM;  Surgeon: Lorretta Harp, MD;  Location: [facility] CATH LAB;  Service: Cardiovascular;  Laterality: Left;   LUMBAR DISC SURGERY  1990's   "herniated disc" (08/18/2013)   PERCUTANEOUS STENT INTERVENTION  01/30/2013   Procedure:  PERCUTANEOUS STENT INTERVENTION;  Surgeon: Lorretta Harp, MD;  Location: [facility] CATH LAB;  Service: Cardiovascular;;   PERIPHERAL VASCULAR CATHETERIZATION N/A 09/05/2015   Procedure: Lower Extremity Angiography;  Surgeon: Lorretta Harp, MD;  Location: [place] CV LAB;  Service: Cardiovascular;  Laterality: N/A;   POPLITEAL ARTERY STENT Left 01/30/13   POPLITEAL ARTERY STENT Left 10/'28/2014   successful Viabahn covered stent placement within the previously placed IDEV  stents/notes 08/18/2013   POSTERIOR  LUMBAR FUSION  1990's   TONSILLECTOMY     WRIST FUSION Right 1990's?   "first OR didn't correct problem" (08/18/2013)    Current Outpatient Medications  Medication Sig Dispense Refill   Acetaminophen 500 MG capsule Take 1,500 mg by mouth in the morning and at bedtime.     aspirin EC 81 MG tablet Take 81 mg by mouth daily. Swallow whole.     atorvastatin (LIPITOR) 80 MG tablet TAKE 1 TABLET BY MOUTH DAILY AT SIX IN THE EVENING 90 tablet 2   cetirizine (ZYRTEC) 10 MG tablet TAKE 1 TABLET BY MOUTH DAILY AT BEDTIME 30 tablet 5   cilostazol (PLETAL) 50 MG tablet Take 1 tablet (50 mg total) by mouth 2 (two) times daily. 180 tablet 3   fluticasone (FLONASE) 50 MCG/ACT nasal spray Place 1 spray into both nostrils daily. 18.2 g 5   ketoconazole (NIZORAL) 2 % shampoo Apply 1 application topically 2 (two) times a week.     losartan (COZAAR) 100 MG tablet Take 100 mg by mouth daily.     sacubitril-valsartan (ENTRESTO) 24-26 MG Take 1 tablet by mouth 2 (two) times daily. 20 tablet 3   tamsulosin (FLOMAX) 0.4 MG CAPS capsule Take 0.8 mg by mouth daily.     No current facility-administered medications for this visit.    Allergies as of 08/15/2021 - Review Complete 08/15/2021  Allergen Reaction Noted   Plavix [clopidogrel bisulfate] Itching and Rash 07/31/2013    Family History  Problem Relation Age of Onset   Seizures Mother    Diabetes Mother    Cancer Father    Coronary artery disease Brother    Diabetes Brother    Lung cancer Brother    Lung cancer Sister    Heart attack Maternal Grandmother    Colon cancer Neg Hx    Esophageal cancer Neg Hx    Pancreatic cancer Neg Hx    Stomach cancer Neg Hx     Social History   Socioeconomic History   Marital status: Married    Spouse name: [name]   Number of children: 4   Years of education: Not on file   Highest education level: Not on file  Occupational History   Not on file  Tobacco Use   Smoking status: Former    Packs/day: 1.00     Years: 50.00    Pack years: 50.00    Types: Cigarettes    Quit date: 08/26/1997    Years since quitting: 23.9   Smokeless tobacco: Former    Types: Nurse, children's Use: Never used  Substance and Sexual Activity   Alcohol use: Yes    Comment: occ-"very little"   Drug use: No   Sexual activity: Never  Other Topics Concern   Not on file  Social History Narrative   Not on file   Social Determinants of Health   Financial Resource Strain: Not on file  Food Insecurity: Not on file  Transportation Needs: Not on file  Physical Activity:  Not on file  Stress: Not on file  Social Connections: Not on file  Intimate Partner Violence: Not on file     Physical Exam: BP 132/70   Pulse 97   Ht 5\' 3"  (1.6 m)   Wt 192 lb 6 oz (87.3 kg)   BMI 34.08 kg/m  Constitutional: generally well-appearing Psychiatric: alert and oriented x3 Abdomen: soft, nontender, nondistended, no obvious ascites, no peritoneal signs, normal bowel sounds No peripheral edema noted in lower extremities  Assessment and plan: 80 y.o. male with multifactorial anemia, Hemoccult positive stool, change in bowel habits  He is describing dark sticky stools, not frankly black.  I am getting a CBC to check to see if he is more significantly anemic than he was several months ago.  We will reschedule his colonoscopy to be done at his soonest convenience.  He does not need to hold his Pletal prior to the colonoscopy  Please see the "Patient Instructions" section for addition details about the plan.  Owens Loffler, MD [facility] 08/15/2021, 3:55 PM   Total time on date of encounter was 30 minutes (this included time spent preparing to see the patient reviewing records; obtaining and/or reviewing separately obtained history; performing a medically appropriate exam and/or evaluation; counseling and educating the patient and family if present; ordering medications, tests or procedures if applicable; and  documenting clinical information in the health record).

## 2021-08-15 NOTE — Patient Instructions (Signed)
If you are age 81 or older, your body mass index should be between 23-30. Your Body mass index is 34.08 kg/m. If this is out of the aforementioned range listed, please consider follow up with your Primary Care Provider. ________________________________________________________  The [facility] providers would like to encourage you to use [facility] to communicate with providers for non-urgent requests or questions.  Due to long hold times on the telephone, sending your provider a message by [facility] may be a faster and more efficient way to get a response.  Please allow 48 business hours for a response.  Please remember that this is for non-urgent requests.  _______________________________________________________  Your provider has requested that you go to the basement level for lab work before leaving today. Press "B" on the elevator. The lab is located at the first door on the left as you exit the elevator.  You have been scheduled for a colonoscopy. Please follow written instructions given to you at your visit today.  Please pick up your prep supplies at the pharmacy within the next 1-3 days. If you use inhalers (even only as needed), please bring them with you on the day of your procedure.  Due to recent changes in healthcare laws, you may see the results of your imaging and laboratory studies on MyChart before your provider has had a chance to review them.  We understand that in some cases there may be results that are confusing or concerning to you. Not all laboratory results come back in the same time frame and the provider may be waiting for multiple results in order to interpret others.  Please give [place] 48 hours in order for your provider to thoroughly review all the results before contacting the office for clarification of your results.   Please start taking citrucel (orange flavored) powder fiber supplement.  This may cause some bloating at first but that usually goes away. Begin with a small  spoonful and work your way up to a large, heaping spoonful daily over a week.  Thank you for entrusting me with your care and choosing [facility].  Dr Ardis Hughs

## 2021-09-20 ENCOUNTER — Telehealth: Payer: Self-pay | Admitting: Cardiovascular Disease

## 2021-09-20 MED ORDER — ENTRESTO 24-26 MG PO TABS: 1.0000 | ORAL_TABLET | Freq: Two times a day (BID) | ORAL | 8 refills | Status: DC

## 2021-09-20 NOTE — Telephone Encounter (Signed)
*  STAT* If patient is at the pharmacy, call can be transferred to refill team.   1. Which medications need to be refilled? (please list name of each medication and dose if known) sacubitril-valsartan (ENTRESTO) 24-26 MG  2. Which pharmacy/location (including street and city if local pharmacy) is medication to be sent to? [facility] [place].  3. Do they need a 30 day or 90 day supply? 30 ds

## 2021-09-20 NOTE — Telephone Encounter (Signed)
Pt's medication was sent to pt's pharmacy as requested. Confirmation received.  °

## 2021-09-25 ENCOUNTER — Encounter: Payer: Self-pay | Admitting: Gastroenterology

## 2021-09-25 ENCOUNTER — Other Ambulatory Visit: Payer: Self-pay

## 2021-09-25 ENCOUNTER — Ambulatory Visit (AMBULATORY_SURGERY_CENTER): Payer: Medicare Other | Admitting: Gastroenterology

## 2021-09-25 VITALS — BP 148/80 | HR 79 | Temp 97.9°F | Resp 11 | Ht 63.0 in | Wt 192.0 lb

## 2021-09-25 DIAGNOSIS — D122 Benign neoplasm of ascending colon: Secondary | ICD-10-CM | POA: Diagnosis not present

## 2021-09-25 DIAGNOSIS — K5730 Diverticulosis of large intestine without perforation or abscess without bleeding: Secondary | ICD-10-CM | POA: Diagnosis not present

## 2021-09-25 DIAGNOSIS — D123 Benign neoplasm of transverse colon: Secondary | ICD-10-CM

## 2021-09-25 DIAGNOSIS — D125 Benign neoplasm of sigmoid colon: Secondary | ICD-10-CM | POA: Diagnosis not present

## 2021-09-25 DIAGNOSIS — R194 Change in bowel habit: Secondary | ICD-10-CM

## 2021-09-25 DIAGNOSIS — R195 Other fecal abnormalities: Secondary | ICD-10-CM

## 2021-09-25 MED ORDER — SODIUM CHLORIDE 0.9 % IV SOLN
500.0000 mL | Freq: Once | INTRAVENOUS | Status: DC
Start: 2021-09-25 — End: 2021-09-25

## 2021-09-25 NOTE — Patient Instructions (Signed)
6 polyps removed today- await pathology  Continue your normal medications  Please read over handouts about polyps, diverticulosis and high fiber diets   YOU HAD AN ENDOSCOPIC PROCEDURE TODAY AT [facility]:   Refer to the procedure report that was given to you for any specific questions about what was found during the examination.  If the procedure report does not answer your questions, please call your gastroenterologist to clarify.  If you requested that your care partner not be given the details of your procedure findings, then the procedure report has been included in a sealed envelope for you to review at your convenience later.  YOU SHOULD EXPECT: Some feelings of bloating in the abdomen. Passage of more gas than usual.  Walking can help get rid of the air that was put into your GI tract during the procedure and reduce the bloating. If you had a lower endoscopy (such as a colonoscopy or flexible sigmoidoscopy) you may notice spotting of blood in your stool or on the toilet paper. If you underwent a bowel prep for your procedure, you may not have a normal bowel movement for a few days.  Please Note:  You might notice some irritation and congestion in your nose or some drainage.  This is from the oxygen used during your procedure.  There is no need for concern and it should clear up in a day or so.  SYMPTOMS TO REPORT IMMEDIATELY:  Following lower endoscopy (colonoscopy or flexible sigmoidoscopy):  Excessive amounts of blood in the stool  Significant tenderness or worsening of abdominal pains  Swelling of the abdomen that is new, acute  Fever of 100F or higher For urgent or emergent issues, a gastroenterologist can be reached at any hour by calling [phone]. Do not use MyChart messaging for urgent concerns.    DIET:  We do recommend a small meal at first, but then you may proceed to your regular diet.  Drink plenty of fluids but you should avoid alcoholic beverages  for 24 hours.  ACTIVITY:  You should plan to take it easy for the rest of today and you should NOT DRIVE or use heavy machinery until tomorrow (because of the sedation medicines used during the test).    FOLLOW UP: Our staff will call the number listed on your records 48-72 hours following your procedure to check on you and address any questions or concerns that you may have regarding the information given to you following your procedure. If we do not reach you, we will leave a message.  We will attempt to reach you two times.  During this call, we will ask if you have developed any symptoms of COVID 19. If you develop any symptoms (ie: fever, flu-like symptoms, shortness of breath, cough etc.) before then, please call [phone].  If you test positive for Covid 19 in the 2 weeks post procedure, please call and report this information to [place].    If any biopsies were taken you will be contacted by phone or by letter within the next 1-3 weeks.  Please call us at [phone] if you have not heard about the biopsies in 3 weeks.    SIGNATURES/CONFIDENTIALITY: You and/or your care partner have signed paperwork which will be entered into your electronic medical record.  These signatures attest to the fact that that the information above on your After Visit Summary has been reviewed and is understood.  Full responsibility of the confidentiality of this discharge information lies with  you and/or your care-partner.

## 2021-09-25 NOTE — Op Note (Signed)
[place] Patient Name: Philip Hurst Procedure Date: 09/25/2021 3:19 PM MRN: [id number] Endoscopist: Milus Banister , MD Age: 80 Referring MD:  Date of Birth: 07/09/1941 Gender: Male Account #: [id number] Procedure:                Colonoscopy Indications:              Heme positive stool Medicines:                Monitored Anesthesia Care Procedure:                Pre-Anesthesia Assessment:                           - Prior to the procedure, a History and Physical                            was performed, and patient medications and                            allergies were reviewed. The patient's tolerance of                            previous anesthesia was also reviewed. The risks                            and benefits of the procedure and the sedation                            options and risks were discussed with the patient.                            All questions were answered, and informed consent                            was obtained. Prior Anticoagulants: The patient has                            taken no previous anticoagulant or antiplatelet                            agents. ASA Grade Assessment: II - A patient with                            mild systemic disease. After reviewing the risks                            and benefits, the patient was deemed in                            satisfactory condition to undergo the procedure.                           After obtaining informed consent, the colonoscope  was passed under direct vision. Throughout the                            procedure, the patient's blood pressure, pulse, and                            oxygen saturations were monitored continuously. The                            CF [id number] #[id number] was introduced through the anus                            and advanced to the the cecum, identified by                            appendiceal orifice and ileocecal valve. The                             colonoscopy was performed without difficulty. The                            patient tolerated the procedure well. The quality                            of the bowel preparation was good. The ileocecal                            valve, appendiceal orifice, and rectum were                            photographed. Scope In: 3:24:06 PM Scope Out: 3:41:55 PM Scope Withdrawal Time: 0 hours 14 minutes 3 seconds  Total Procedure Duration: 0 hours 17 minutes 49 seconds  Findings:                 Five sessile polyps were found in the transverse                            colon and ascending colon. The polyps were 4 to 7                            mm in size. These polyps were removed with a cold                            snare. Resection and retrieval were complete.                           A 13 mm polyp was found in the distal sigmoid                            colon. The polyp was semi-pedunculated. The polyp                            was removed  with a hot snare. Resection and                            retrieval were complete.                           Multiple small and large-mouthed diverticula were                            found in the left colon.                           The exam was otherwise without abnormality on                            direct and retroflexion views. Complications:            No immediate complications. Estimated blood loss:                            None. Estimated Blood Loss:     Estimated blood loss: none. Impression:               - Five 4 to 7 mm polyps in the transverse colon and                            in the ascending colon, removed with a cold snare.                            Resected and retrieved.                           - One 13 mm polyp in the distal sigmoid colon,                            removed with a hot snare. Resected and retrieved.                           - Diverticulosis in the left colon.                            - The examination was otherwise normal on direct                            and retroflexion views. Recommendation:           - Patient has a contact number available for                            emergencies. The signs and symptoms of potential                            delayed complications were discussed with the                            patient. Return to normal activities  tomorrow.                            Written discharge instructions were provided to the                            patient.                           - Resume previous diet.                           - Continue present medications.                           - Await pathology results. Milus Banister, MD 09/25/2021 3:45:07 PM This report has been signed electronically.

## 2021-09-25 NOTE — Progress Notes (Signed)
PT taken to PACU. Monitors in place. VSS. Report given to RN. 

## 2021-09-25 NOTE — Progress Notes (Signed)
DT VS 

## 2021-09-25 NOTE — Progress Notes (Signed)
Called to room to assist during endoscopic procedure.  Patient ID and intended procedure confirmed with present staff. Received instructions for my participation in the procedure from the performing physician.  

## 2021-09-27 ENCOUNTER — Telehealth: Payer: Self-pay | Admitting: *Deleted

## 2021-09-27 NOTE — Telephone Encounter (Signed)
First follow up call attempt.  LVM. 

## 2021-09-27 NOTE — Telephone Encounter (Signed)
Second follow up call attempt.  No answer.  Left message earlier this morning

## 2021-10-05 ENCOUNTER — Encounter: Payer: Self-pay | Admitting: Gastroenterology

## 2021-10-19 ENCOUNTER — Other Ambulatory Visit: Payer: Self-pay

## 2021-10-19 MED ORDER — ENTRESTO 24-26 MG PO TABS: 1.0000 | ORAL_TABLET | Freq: Two times a day (BID) | ORAL | 3 refills | Status: AC

## 2021-10-19 NOTE — Telephone Encounter (Signed)
[employer] Pt assistance application completed and fax with confirmation of successfully received.

## 2021-12-25 ENCOUNTER — Other Ambulatory Visit: Payer: Self-pay | Admitting: Cardiovascular Disease

## 2021-12-26 ENCOUNTER — Other Ambulatory Visit: Payer: Self-pay | Admitting: Cardiovascular Disease

## 2022-02-16 ENCOUNTER — Ambulatory Visit: Payer: Medicare Other | Admitting: Cardiovascular Disease

## 2022-02-16 ENCOUNTER — Encounter: Payer: Self-pay | Admitting: Cardiovascular Disease

## 2022-02-16 VITALS — BP 132/60 | HR 102 | Ht 63.0 in | Wt 197.8 lb

## 2022-02-16 DIAGNOSIS — E785 Hyperlipidemia, unspecified: Secondary | ICD-10-CM

## 2022-02-16 DIAGNOSIS — R0600 Dyspnea, unspecified: Secondary | ICD-10-CM | POA: Diagnosis not present

## 2022-02-16 DIAGNOSIS — I2510 Atherosclerotic heart disease of native coronary artery without angina pectoris: Secondary | ICD-10-CM

## 2022-02-16 DIAGNOSIS — I739 Peripheral vascular disease, unspecified: Secondary | ICD-10-CM

## 2022-02-16 DIAGNOSIS — I255 Ischemic cardiomyopathy: Secondary | ICD-10-CM

## 2022-02-16 MED ORDER — CILOSTAZOL 100 MG PO TABS: 100.0000 mg | ORAL_TABLET | Freq: Two times a day (BID) | ORAL | 3 refills | Status: AC

## 2022-02-16 NOTE — Assessment & Plan Note (Signed)
History of ischemic cardiomyopathy with an initial EF at the time of bypass grafting of 25%.  This improved with optimal medical therapy up to an EF of 55 to 60% by 2D echo performed 10/27/2019.  He has complained of increasing dyspnea recently.  I am going to recheck a 2D echocardiogram. ?

## 2022-02-16 NOTE — Assessment & Plan Note (Signed)
History of peripheral arterial disease status post intervention of his left occluded popliteal artery by myself remotely.  Angiography performed October 2014 revealed a 90 occluded ID EV stent.  He underwent restenting with a Viabahn stent which subsequently occluded as well.  I did ultimately send him to Dr. Trula Slade for consideration of fem-tib bypass grafting although he did not feel he was a good candidate for this.  He is on low-dose Pletal which offers him some benefit which I am going to titrate up to 100 mg p.o. twice daily. ?

## 2022-02-16 NOTE — Assessment & Plan Note (Signed)
History of dyslipidemia on high-dose atorvastatin with lipid profile performed 07/06/2021 revealing total cholesterol 120, LDL 57 and HDL of 42. ?

## 2022-02-16 NOTE — Assessment & Plan Note (Signed)
History of essential hypertension a blood pressure measured today at 132/60.  He is on losartan ?

## 2022-02-16 NOTE — Progress Notes (Signed)
? ? ? ?02/16/2022 ?Philip Hurst   ?1941/07/19  ?[id number] ? ?Primary Physician Philip Downing, MD ?Primary Cardiologist: Philip Harp MD Lupe Carney, [place] ? ?HPI:  Philip Hurst is a 81 y.o.  Caucasian male who I last saw in the office   11/29/2020.  He has a history of peripheral artery disease and is having symptoms of claudication with a left lower extremity ABI of 0.52 and a right of 0.90. I last saw him in the office 10/27/14.His history also includes hypertension, coronary artery disease, hyperlipidemia. He had a normal cardiac stress test on December 15, 2012 which was nonischemic. He also has dyslipidemia and has remote tobacco abuse but quit 20 years ago. Dr. Gwenlyn Hurst recently performed a lower extremity angiogram which revealed an occluded popliteal artery in the left lower extremity. The right was normal. He also had widely patent renal arteries. He underwent successful balloon angioplasty and stenting of the occluded segment using a chocolate balloon, overlapping IDEV self-expanding stent. The patient now presents today in followup to that procedure. He appears to be doing quite well. He is feeling a little bit under the weather, however, he feels that the left lower extremity claudication symptoms have resolved. He has not done quite as much walking as he used to, but on one of his walks which normally would have caused him problems he did not have any pain. He also denies nausea, vomiting, fever, dizziness, lightheadedness, chest pain, shortness of breath, edema.  ? ?The patient was taking Plavix upon discharge from the hospital but developed an allergy to it and was stopped. His aspirin was then increased to 325 mg daily.he had a followup Doppler study performed 02/16/13 revealing a left ABI of 1 with a widely patent popliteal artery.  the patient underwent repeat angiography October 2014 revealing an occluded IDE V stent in the left popliteal artery. He underwent Viabahn restenting with  excellent angiographic, ultrasonographic and clinical result up until present 2 weeks ago when he developed sudden onset of recurrent left calf pain. Subsequent Doppler's showed occlusion of his popliteal artery with an ABI in the 0.3 range. He underwent peripheral angiography by myself 09/05/15 revealing an occluded popliteal artery stent, occluded P3 segment of the popliteal artery on the left with reconstitution of the anterior tibial and peroneal arteries. He is considerably symptomatic with limitation of his activities of daily living. I do not think he has a percutaneous solution for revascularization given the 2 prior stenting procedures. I'm going to begin him on Pletal and for him to Dr. Trula Hurst for consideration of fem-tib bypass grafting ?  ?He was admitted to the hospital 07/16/2019 with pneumonia and sepsis.  He ultimately had positive enzymes and EKG changes which led to cardiac catheterization by Dr. Tamala Hurst revealing an occluded dominant RCA with left-to-right collaterals, high-grade proximal LAD disease and obtuse marginal branch disease.  He underwent CABG x2 by Dr. Kipp Hurst 07/22/2019 with a LIMA to the LAD and a vein graft to the PDA.  His EF initially was 25% which has since for risen to normal by echo 10/27/2019 after being treated with optimal medical therapy and revascularization therapy. ?  ?Since I saw him a year ago he continues to complain of left calf claudication which is lifestyle limiting.  He was also diagnosed with spinal stenosis and is seeing Dr. Lynann Hurst for potential surgery.  He denies chest pain but has noticed some increasing dyspnea on exertion however. ?  ? ? ?Current Meds  ?  Medication Sig  ? Acetaminophen 500 MG capsule Take 1,500 mg by mouth in the morning and at bedtime.  ? aspirin EC 81 MG tablet Take 81 mg by mouth daily. Swallow whole.  ? atorvastatin (LIPITOR) 80 MG tablet TAKE 1 TABLET BY MOUTH DAILY AT SIX IN THE EVENING  ? ketoconazole (NIZORAL) 2 % shampoo Apply 1  application topically 2 (two) times a week.  ? losartan (COZAAR) 100 MG tablet Take 100 mg by mouth daily.  ? methylcellulose (CITRUCEL) oral powder Take 1 packet by mouth daily.  ? sacubitril-valsartan (ENTRESTO) 24-26 MG Take 1 tablet by mouth 2 (two) times daily.  ? tamsulosin (FLOMAX) 0.4 MG CAPS capsule Take 0.8 mg by mouth daily.  ? [DISCONTINUED] cilostazol (PLETAL) 50 MG tablet Take 1 tablet (50 mg total) by mouth 2 (two) times daily.  ?  ? ?Allergies  ?Allergen Reactions  ? Plavix [Clopidogrel Bisulfate] Itching and Rash  ? ? ?Social History  ? ?Socioeconomic History  ? Marital status: Married  ?  Spouse name: Philip Hurst  ? Number of children: 4  ? Years of education: Not on file  ? Highest education level: Not on file  ?Occupational History  ? Not on file  ?Tobacco Use  ? Smoking status: Former  ?  Packs/day: 1.00  ?  Years: 50.00  ?  Pack years: 50.00  ?  Types: Cigarettes  ?  Quit date: 08/26/1997  ?  Years since quitting: 24.4  ? Smokeless tobacco: Former  ?  Types: Chew  ?Vaping Use  ? Vaping Use: Never used  ?Substance and Sexual Activity  ? Alcohol use: Yes  ?  Comment: occ-"very little"  ? Drug use: No  ? Sexual activity: Not Currently  ?Other Topics Concern  ? Not on file  ?Social History Narrative  ? Not on file  ? ?Social Determinants of Health  ? ?Financial Resource Strain: Not on file  ?Food Insecurity: Not on file  ?Transportation Needs: Not on file  ?Physical Activity: Not on file  ?Stress: Not on file  ?Social Connections: Not on file  ?Intimate Partner Violence: Not on file  ?  ? ?Review of Systems: ?General: negative for chills, fever, night sweats or weight changes.  ?Cardiovascular: negative for chest pain, dyspnea on exertion, edema, orthopnea, palpitations, paroxysmal nocturnal dyspnea or shortness of breath ?Dermatological: negative for rash ?Respiratory: negative for cough or wheezing ?Urologic: negative for hematuria ?Abdominal: negative for nausea, vomiting, diarrhea, bright red blood  per rectum, melena, or hematemesis ?Neurologic: negative for visual changes, syncope, or dizziness ?All other systems reviewed and are otherwise negative except as noted above. ? ? ? ?Blood pressure 132/60, pulse (!) 102, height '5\' 3"'$  (1.6 m), weight 197 lb 12.8 oz (89.7 kg), SpO2 90 %.  ?General appearance: alert and no distress ?Neck: no adenopathy, no carotid bruit, no JVD, supple, symmetrical, trachea midline, and thyroid not enlarged, symmetric, no tenderness/mass/nodules ?Lungs: clear to auscultation bilaterally ?Heart: regular rate and rhythm, S1, S2 normal, no murmur, click, rub or gallop ?Extremities: extremities normal, atraumatic, no cyanosis or edema ?Pulses: Decreased left pedal pulse ?Skin: Skin color, texture, turgor normal. No rashes or lesions ?Neurologic: Grossly normal ? ?EKG sinus tachycardia at 102 with occasional PVC.  I personally reviewed this EKG. ? ?ASSESSMENT AND PLAN:  ? ?Hypertension ?History of essential hypertension a blood pressure measured today at 132/60.  He is on losartan ? ?CAD (coronary artery disease), single vessel disease with collaterals, medical therapy ?History of CAD status post  cardiac catheterization performed by Dr. Tamala Hurst September 2020 revealing occluded dominant RCA with left-to-right collaterals and high-grade segmental proximal LAD disease.  He underwent CABG x2 by Dr. Kipp Hurst 07/22/2019 with a LIMA to his LAD and a vein to the PDA.  He denies chest pain but has had increasing dyspnea on exertion. ? ?Dyslipidemia, goal LDL below 70 ?History of dyslipidemia on high-dose atorvastatin with lipid profile performed 07/06/2021 revealing total cholesterol 120, LDL 57 and HDL of 42. ? ?Claudication of left lower extremity, ABI on Lt now  .80 or less down from 1.0 in 01/2013 ?History of peripheral arterial disease status post intervention of his left occluded popliteal artery by myself remotely.  Angiography performed October 2014 revealed a 90 occluded ID EV stent.  He  underwent restenting with a Viabahn stent which subsequently occluded as well.  I did ultimately send him to Dr. Trula Hurst for consideration of fem-tib bypass grafting although he did not feel he was a good candidate

## 2022-02-16 NOTE — Patient Instructions (Signed)
Medication Instructions:  ?INCREASE Pletal to 100 mg 2 times a day  ?*If you need a refill on your cardiac medications before your next appointment, please call your pharmacy* ? ? ?Lab Work: ?NONE ordered at this time of appointment  ? ?If you have labs (blood work) drawn today and your tests are completely normal, you will receive your results only by: ?MyChart Message (if you have MyChart) OR ?A paper copy in the mail ?If you have any lab test that is abnormal or we need to change your treatment, we will call you to review the results. ? ? ?Testing/Procedures: ?Your physician has requested that you have an echocardiogram. Echocardiography is a painless test that uses sound waves to create images of your heart. It provides your doctor with information about the size and shape of your heart and how well your heart?s chambers and valves are working. This procedure takes approximately one hour. There are no restrictions for this procedure. ? ?Follow-Up: ?At [facility], you and your health needs are our priority.  As part of our continuing mission to provide you with exceptional heart care, we have created designated Provider Care Teams.  These Care Teams include your primary Cardiologist (physician) and Advanced Practice Providers (APPs -  Physician Assistants and Nurse Practitioners) who all work together to provide you with the care you need, when you need it. ? ?We recommend signing up for the patient portal called "MyChart".  Sign up information is provided on this After Visit Summary.  MyChart is used to connect with patients for Virtual Visits (Telemedicine).  Patients are able to view lab/test results, encounter notes, upcoming appointments, etc.  Non-urgent messages can be sent to your provider as well.   ?To learn more about what you can do with MyChart, go to [email or web address].   ? ?Your next appointment:   ?6 month(s) ? ?The format for your next appointment:   ?In Person ? ?Provider:   ?Coletta Memos, FNP    Then, Quay Burow, MD will plan to see you again in 12 month(s).  ? ? ?Other Instructions ? ?Important Information About Sugar ? ? ? ? ? ? ?

## 2022-02-16 NOTE — Assessment & Plan Note (Signed)
History of CAD status post cardiac catheterization performed by Dr. Tamala Julian September 2020 revealing occluded dominant RCA with left-to-right collaterals and high-grade segmental proximal LAD disease.  He underwent CABG x2 by Dr. Kipp Brood 07/22/2019 with a LIMA to his LAD and a vein to the PDA.  He denies chest pain but has had increasing dyspnea on exertion. ?

## 2022-03-14 ENCOUNTER — Ambulatory Visit (HOSPITAL_COMMUNITY): Payer: Medicare Other | Attending: Cardiovascular Disease

## 2022-03-14 DIAGNOSIS — R0600 Dyspnea, unspecified: Secondary | ICD-10-CM | POA: Diagnosis present

## 2022-03-14 DIAGNOSIS — R0609 Other forms of dyspnea: Secondary | ICD-10-CM | POA: Diagnosis not present

## 2022-03-14 LAB — ECHOCARDIOGRAM COMPLETE
Area-P 1/2: 3.46 cm2
S' Lateral: 2.9 cm

## 2022-06-21 ENCOUNTER — Telehealth: Payer: Self-pay | Admitting: Cardiovascular Disease

## 2022-06-21 NOTE — Progress Notes (Signed)
Cardiology Clinic Note   Patient Name: Philip Hurst Date of Encounter: 06/26/2022  Primary Care Provider:  Leonard Downing, MD Primary Cardiologist:  Quay Burow, MD  Patient Profile    Philip Hurst 81 year old male presents today for   evaluation of his shortness of breath.  Past Medical History    Past Medical History:  Diagnosis Date   Anemia    Arthritis    "back, joints in right hand" (08/18/2013)   CAD (coronary artery disease), single vessel disease with collaterals, medical therapy 09/12/2011   Cataract    bil cataracts removed   Chronic lower back pain    Claudication of left lower extremity, ABI Lt 0.52 -lifestyle limiting  ABI rt 0.90 01/30/2013   Clotting disorder (HCC)    blood clot in left calf   Coronary artery disease    OCCLUDED RCA   Hyperlipidemia    Hypertension    Myocardial infarction [facility])    November 2021   Normal cardiac stress test, No ischemia 12/15/12 01/30/2013   PAD (peripheral artery disease) ([place])    Past Surgical History:  Procedure Laterality Date   ANTERIOR LAT LUMBAR FUSION Right 12/14/2020   Procedure: RIGHT LATERAL INTERBODY FUSION LUMBAR TWO - LUMBAR THREE WITH INSTRUMENTATION AND ALLOGRAFT;  Surgeon: Phylliss Bob, MD;  Location: [place];  Service: Orthopedics;  Laterality: Right;   BACK SURGERY     CARDIAC CATHETERIZATION  '@2002'$    1 vessel disease w/collaterals,medical therapy   CARDIOVASCULAR STRESS TEST  12/07/2008   EF 58%, NO ISCHEMIA   CATARACT EXTRACTION W/ INTRAOCULAR LENS  IMPLANT, BILATERAL Bilateral    CORONARY ARTERY BYPASS GRAFT  07/22/2019   Procedure: OFF PUMP CORONARY ARTERY BYPASS GRAFTING (CABG) times two using left internal mammary artery and right leg saphenous vein;  Surgeon: Lajuana Matte, MD;  Location: [place] OR;  Service: Open Heart Surgery;;   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 at rest; left  lower extrem. norm. left popliteal artery stent norm. patency   LOWER EXTREMITY ANGIOGRAM Left 08/18/2013   Procedure: LOWER EXTREMITY ANGIOGRAM;  Surgeon: Lorretta Harp, MD;  Location: [facility] CATH LAB;  Service: Cardiovascular;  Laterality: Left;   LUMBAR DISC SURGERY  1990's   "herniated disc" (08/18/2013)   PERCUTANEOUS STENT INTERVENTION  01/30/2013   Procedure: PERCUTANEOUS STENT INTERVENTION;  Surgeon: Lorretta Harp, MD;  Location: [facility] CATH LAB;  Service: Cardiovascular;;   PERIPHERAL VASCULAR CATHETERIZATION N/A 09/05/2015   Procedure: Lower Extremity Angiography;  Surgeon: Lorretta Harp, MD;  Location: [place] CV LAB;  Service: Cardiovascular;  Laterality: N/A;   POPLITEAL ARTERY STENT Left 01/30/13   POPLITEAL ARTERY STENT Left 10/'28/2014   successful Viabahn covered stent placement within the previously placed IDEV  stents/notes 08/18/2013   POSTERIOR LUMBAR FUSION  1990's   TONSILLECTOMY     WRIST FUSION Right 1990's?   "first OR didn't correct problem" (08/18/2013)    Allergies  Allergies  Allergen Reactions   Plavix [Clopidogrel Bisulfate] Itching and Rash    History of Present Illness    Mr. Catherman has a past medical history of hypertension, coronary artery disease (CABG x2  06/2019, LIMA-LAD, SVG-PDA.), peripheral arterial disease acute systolic heart failure, STEMI, ischemic cardiomyopathy, AKI, and hyportension.  He had a normal stress test 12/15/2012 which was nonischemic.  Dr. Gwenlyn Found recently performed lower extremity angiogram revealing an occluded popliteal artery in the left lower extremity.  His right popliteal artery was normal.  He also had widely patent renal arteries at that time.  He had a successful balloon  angioplasty and stenting of the occluded segment using a chocolate balloon.(01/2013)  He was seen in follow-up to this procedure and was doing well.  His claudication had resolved.  He had also begun to walk regularly at that time.  This ultimately occluded and was treated with a Viabahn stent which also occluded.  He was then sent to Dr. Trula Slade for consideration of fitting-to have bypass however medical treatment was recommended.   He was admitted to the hospital on 07/16/2019 pneumonia and sepsis.  He had positive enzymes at that time and EKG changes.  He underwent cardiac catheterization by Dr. Tamala Julian which showed an occluded dominant RCA with left to right collaterals, high-grade proximal LAD and obtuse marginal branch disease.  He underwent CABG x2 by Dr. Kipp Brood 06/2019 with LIMA to LAD and SVG to PDA.  His EF was 25% at that time and has returned to normal with his echocardiogram 10/27/2019.   He was seen by Dr. Gwenlyn Found on 10/29/2018 during that time he was hypertensive but had not taken his blood pressure medications.  He denied chest pain, shortness of breath,and    He called nurse triage line  11/12/2019 and stated that his blood pressures been elevated for several weeks now.  140s-150s over 100s-116's.  He stated he has been taking his blood pressure after his medications including carvedilol 12.5 Entresto 24-26, and spironolactone 12.5 daily.   He presented the clinic 09/11/2020 and stated he had been eating Campbell soup regularly.  He stated that he was also noticing some weakness in his legs around the same time as he noticed his blood pressure had been elevated.  I  gave him the salty 6 sheet and increased his carvedilol to 25 mg twice daily.  I  educated on sodium reduction.   He followed up with Dr. Gwenlyn Found on 12/15/2019 and denied chest pain or shortness of breath.  He had mild left calf claudication with exercise.  It was decided that he would be medically managed.  His blood pressure was much  better controlled at 110/72.   He presented to the clinic 06/13/2020 for follow-up evaluation and stated he felt well.  He denied chest pain lower extremity edema, and syncopal events.  He stated that he did have some weakness in his lower extremities but was limited in his physical activity due to his back pain.  He had tolerated his medication changes well and his blood pressures were much better controlled.  He also complained of a cough that he has seen his PCP for.  He was prescribed Flonase which  helped somewhat.  Lungs were clear to auscultation on exam.  I have instructed him to try over-the-counter Zyrtec, use a humidifier, and change HVAC filters.  I instructed him to use a cane with walking as an extra base of support.  I  gave  the salty 6 diet sheet and had him follow-up with Dr. Gwenlyn Found or myself in 6 months.  He was seen by Dr. Gwenlyn Found on 11/29/2020.  During that time he complained of left calf claudication that was lifestyle limiting.  He had been diagnosed with spinal stenosis and was seen Dr. Lynann Bologna  to discuss surgical options.  He denied chest pain shortness of breath.  He underwent lumbar fusion 12/15/2020.  He presented to the clinic 06/01/2021 for follow-up evaluation stated he felt fairly well.  He was doing physical therapy 2 days a week.  He did notice some weakness in his lower extremities but felt it was improving with his physical therapy.  He reported left calf claudication when he walked 4-5 blocks.  This was stable/unchanged from his previous visit.  He asked if increasing his Pletal would improve his symptoms.  He continued to eat a heart healthy diet and denies chest pain shortness of breath.  We planned follow-up in 6 months.  We will also repeated his fasting lipids and LFTs.  He was seen by Dr. Gwenlyn Found on 02/16/2022.  During that time he complained of increasing dyspnea on exertion.  His pulse was 102 and his blood pressure was 132/60.  His oxygen saturation was 90%.  His EKG  showed sinus tachycardia 102 bpm with occasional PVCs.  His follow-up echocardiogram showed normal LVEF, G1 DD, mildly dilated left atria with no evidence of mitral valve regurgitation  We contacted the nurse triage line on 06/21/2022 and reported increased exertional shortness of breath over the past 2 to 3 weeks.  He denied chest pain, cough, lower extremity swelling, and tight fitting close.  His blood pressure was 150/82 with a pulse of 97.  He presents to the clinic today for follow-up evaluation states he has noticed increased shortness of breath dyspnea over the last 2 to 3 weeks.  He reports an associated cough.  We reviewed his chest x-ray from 2021 which was somewhat inconclusive.  We reviewed his previous echocardiogram.  He and his wife expressed understanding.  I will order a CBC, BMP, chest x-ray and refer to pulmonology for further evaluation and treatment.   Today he denies chest pain, shortness of breath, lower extremity edema, fatigue, palpitations, melena, hematuria, hemoptysis, diaphoresis, weakness, presyncope, syncope, orthopnea, and PND.  Home Medications    Prior to Admission medications   Medication Sig Start Date End Date Taking? Authorizing Provider  Acetaminophen 500 MG capsule Take 1,500 mg by mouth in the morning and at bedtime.    [provider]  aspirin EC 81 MG tablet Take 81 mg by mouth daily. Swallow whole.    [provider]  atorvastatin (LIPITOR) 80 MG tablet TAKE 1 TABLET BY MOUTH DAILY AT SIX IN THE EVENING 03/27/21   Lorretta Harp, MD  cetirizine (ZYRTEC) 10 MG tablet Take 1 tablet (10 mg total) by mouth at bedtime. 11/14/20   Spero Geralds, MD  cilostazol (PLETAL) 50 MG tablet Take 1 tablet (50 mg total) by mouth 2 (two) times daily. 11/29/20   Lorretta Harp, MD  fluticasone (FLONASE) 50 MCG/ACT nasal spray Place 1 spray into both nostrils daily. 11/14/20   Spero Geralds, MD  ketoconazole (NIZORAL) 2 % shampoo Apply 1 application  topically 2 (two) times a week. 10/28/20   [provider]  losartan (COZAAR) 100 MG tablet Take 100 mg by mouth daily. 10/26/20   [provider]  methocarbamol (ROBAXIN) 500 MG tablet Take 1 tablet (500 mg total) by mouth every 6 (six) hours as needed for muscle spasms. Patient not taking: Reported on 05/25/2021 12/15/20   Justice Britain, PA-C  sacubitril-valsartan (ENTRESTO) 24-26 MG Take 1 tablet by mouth 2 (two) times daily.  04/28/20   Lorretta Harp, MD  tamsulosin (FLOMAX) 0.4 MG CAPS capsule Take 0.8 mg by mouth daily. 10/26/20   [provider]    Family History    Family History  Problem Relation Age of Onset   Seizures Mother    Diabetes Mother    Cancer Father    Lung cancer Sister    Coronary artery disease Brother    Diabetes Brother    Lung cancer Brother    Heart attack Maternal Grandmother    Colon cancer Neg Hx    Esophageal cancer Neg Hx    Pancreatic cancer Neg Hx    Stomach cancer Neg Hx    Rectal cancer Neg Hx    He indicated that his mother is deceased. He indicated that his father is deceased. He indicated that the status of his sister is unknown. He indicated that the status of his brother is unknown. He indicated that the status of his maternal grandmother is unknown. He indicated that the status of his neg hx is unknown.   Social History    Social History   Socioeconomic History   Marital status: Married    Spouse name: [name]   Number of children: 4   Years of education: Not on file   Highest education level: Not on file  Occupational History   Not on file  Tobacco Use   Smoking status: Former    Packs/day: 1.00    Years: 50.00    Total pack years: 50.00    Types: Cigarettes    Quit date: 08/26/1997    Years since quitting: 24.8   Smokeless tobacco: Former    Types: Nurse, children's Use: Never used  Substance and Sexual Activity   Alcohol use: Yes    Comment: occ-"very little"   Drug use: No   Sexual  activity: Not Currently  Other Topics Concern   Not on file  Social History Narrative   Not on file   Social Determinants of Health   Financial Resource Strain: Not on file  Food Insecurity: Not on file  Transportation Needs: Not on file  Physical Activity: Not on file  Stress: Not on file  Social Connections: Not on file  Intimate Partner Violence: Not on file     Review of Systems    General:  No chills, fever, night sweats or weight changes.  Cardiovascular:  No chest pain, dyspnea on exertion, edema, orthopnea, palpitations, paroxysmal nocturnal dyspnea. Dermatological: No rash, lesions/masses Respiratory: Cough, dyspnea on exertion, diminished lung sounds right and left bases with expiratory wheeze. Urologic: No hematuria, dysuria Abdominal:   No nausea, vomiting, diarrhea, bright red blood per rectum, melena, or hematemesis Neurologic:  No visual changes, wkns, changes in mental status. All other systems reviewed and are otherwise negative except as noted above.  Physical Exam    VS:  BP 138/72   Pulse 89   Ht '5\' 3"'$  (1.6 m)   Wt 184 lb 12.8 oz (83.8 kg)   SpO2 96%   BMI 32.74 kg/m  , BMI Body mass index is 32.74 kg/m. GEN: Well nourished, well developed, in no acute distress. HEENT: normal. Neck: Supple, no JVD, carotid bruits, or masses. Cardiac: RRR, no murmurs, rubs, or gallops. No clubbing, cyanosis, edema.  Radials/DP/PT 2+ and equal bilaterally.  Respiratory:  Respirations regular and unlabored, clear to auscultation bilaterally. GI: Soft, nontender, nondistended, BS + x 4. MS: no deformity or atrophy. Skin: warm and dry,  no rash. Neuro:  Strength and sensation are intact. Psych: Normal affect.  Accessory Clinical Findings    Recent Labs: 07/06/2021: ALT 16 08/15/2021: Hemoglobin 11.0; Platelets 247.0   Recent Lipid Panel    Component Value Date/Time   CHOL 120 07/06/2021 1203   TRIG 113 07/06/2021 1203   HDL 42 07/06/2021 1203   CHOLHDL 2.9  07/06/2021 1203   LDLCALC 57 07/06/2021 1203    ECG personally reviewed by me today-none today   EKG 06/01/2021  sinus rhythm with first-degree AV block with premature supraventricular complexes 96 bpm  EKG 06/13/2020 sinus bradycardia with first-degree AV block 59 bpm- No acute changes   EKG 08/13/2019 Sinus rhythm with first-degree AV block T wave abnormality consider anterior lateral ischemia.  67 bpm   Cardiac catheterization 9/38/1829  Acute systolic heart failure with precipitous drop in LVEF with anteroapical wall motion abnormality likely related to acute ischemia.  Cannot totally exclude the possibility of a stress cardiomyopathy component.  LVEDP is 20 mmHg. Severe coronary artery disease with segmental 70 to 90% proximal to mid LAD stenosis. 60% second obtuse marginal.  The first marginal is small. Total occlusion, flush, RCA with large collateral bed from left circumflex to distal left ventricular branch.   RECOMMENDATIONS:   IV heparin IV nitroglycerin Gentle diuresis but being careful to monitor kidney function.  He has been exposed to contrast on top of acute kidney injury present on admission.  50 cc of contrast used. Needs surgical consultation to consider bypass grafting.  Ultimately, high risk PCI with LAD atherectomy and stenting using hemodynamic support would be a consideration although with known PAD, support may not be possible.  Diagnostic Dominance: Right    Intervention    Echocardiogram 10/27/2019 IMPRESSIONS      1. Left ventricular ejection fraction, by visual estimation, is 55 to 60%. The left ventricle has normal function. There is moderately increased left ventricular hypertrophy.  2. Left ventricular diastolic parameters are consistent with Grade I diastolic dysfunction (impaired relaxation).  3. The left ventricle has no regional wall motion abnormalities.  4. Global right ventricle has normal systolic function.The right ventricular size  is normal. No increase in right ventricular wall thickness.  5. Left atrial size was normal.  6. Right atrial size was normal.  7. The mitral valve is normal in structure. Mild mitral valve regurgitation. No evidence of mitral stenosis.  8. The tricuspid valve is normal in structure.  9. The aortic valve is normal in structure. Aortic valve regurgitation is not visualized. Mild to moderate aortic valve sclerosis/calcification without any evidence of aortic stenosis. 10. The pulmonic valve was normal in structure. Pulmonic valve regurgitation is trivial. 11. The inferior vena cava is normal in size with greater than 50% respiratory variability, suggesting right atrial pressure of 3 mmHg.  Assessment & Plan   1.  DOE, increased shortness of breath-reports increased work of breathing over the last 2 to 3 weeks.  Chest x-ray 2001 showed chronic airway thickening, bronchial changes, and opacity in mid and lower lungs. Heart healthy low-sodium diet Increase physical activity as tolerated Order chest x-ray Follow up with pulm  Essential hypertension-BP today 138/72.   Continue carvedilol,  Entresto, spironolactone  Heart healthy low-sodium diet-salty 6 reviewed Increase physical activity as tolerated   PAD-Denies claudication.  Previously was to repeat referred to Dr. Trula Slade for consideration of surgical revascularization.  He did not feel it would be a long-term durable procedure and medical management was recommended. Continue Pletal, atorvastatin  Heart healthy low-sodium high-fiber diet  Coronary artery disease-no chest pain today.  Denies recent episodes of chest discomfort.   Continue aspirin,Carvedilol, atorvastatin  Heart healthy low-sodium diet Increase physical activity as tolerated   Acute systolic heart failure/ischemic cardiomyopathy-Euvolemic .weight stable.  No increased DOE or activity intolerance.  CABG x2 06/2019, LIMA-LAD, SVG-PDA.  Echocardiogram 10/27/2019 showed EF 55 to  60%.   Continue Entresto 25-26, carvedilol, spironolactone Heart healthy low-sodium diet Maintain physical activity  Hyperlipidemia- 07/06/2021: Cholesterol, Total 120; HDL 42; LDL Chol Calc (NIH) 57; Triglycerides 113 Continue atorvastatin Heart healthy low-sodium high-fiber diet Increase physical activity as tolerated Order lipid panel  In one month fasting     Disposition: Follow-up with Dr. Gwenlyn Found 3-4 months.   Jossie Ng. Adlee Paar NP-C    06/26/2022, 1:54 PM [facility] [place] [address] Office [phone] Fax [phone]  Notice: This dictation was prepared with Dragon dictation along with smaller phrase technology. Any transcriptional errors that result from this process are unintentional and may not be corrected upon review.  I spent  14 minutes examining this patient, reviewing medications, and using patient centered shared decision making involving her cardiac care.  Prior to her visit I spent greater than 20 minutes reviewing her past medical history,  medications, and prior cardiac tests.

## 2022-06-21 NOTE — Telephone Encounter (Signed)
Patient reported increasing exertional sob over past 2-3 weeks. Denies chest pain, palpitations, cough, edema, or clothes fitting tighter. Sounded sob over phone. BP while on phone 150/82, P 97. Dr. Percival Spanish (DOD) ordered for patient to be seen in clinic as soon as possible. Appointment mad with Thomasene Mohair for 9/5. Recommended for if sob worsens, to go to the ED. Patient verbalized understanding.

## 2022-06-21 NOTE — Telephone Encounter (Signed)
  Pt c/o Shortness Of Breath: STAT if SOB developed within the last 24 hours or pt is noticeably SOB on the phone  1. Are you currently SOB (can you hear that pt is SOB on the phone)? No   2. How long have you been experiencing SOB? 1 month  3. Are you SOB when sitting or when up moving around?  moving around  4. Are you currently experiencing any other symptoms? No   Pt said, his SOB getting worst. He said just by taking out the trash he gets SOB he cant even walk sometimes.

## 2022-06-26 ENCOUNTER — Ambulatory Visit: Payer: Medicare Other | Attending: General Practice | Admitting: General Practice

## 2022-06-26 ENCOUNTER — Ambulatory Visit
Admission: RE | Admit: 2022-06-26 | Discharge: 2022-06-26 | Disposition: A | Discharge status: Home / Self Care | Payer: Medicare Other | Source: Ambulatory Visit | Attending: General Practice | Admitting: General Practice

## 2022-06-26 ENCOUNTER — Encounter: Payer: Self-pay | Admitting: General Practice

## 2022-06-26 VITALS — BP 138/72 | HR 89 | Ht 63.0 in | Wt 184.8 lb

## 2022-06-26 DIAGNOSIS — I10 Essential (primary) hypertension: Secondary | ICD-10-CM | POA: Diagnosis not present

## 2022-06-26 DIAGNOSIS — I2510 Atherosclerotic heart disease of native coronary artery without angina pectoris: Secondary | ICD-10-CM

## 2022-06-26 DIAGNOSIS — I5021 Acute systolic (congestive) heart failure: Secondary | ICD-10-CM

## 2022-06-26 DIAGNOSIS — E785 Hyperlipidemia, unspecified: Secondary | ICD-10-CM

## 2022-06-26 DIAGNOSIS — I739 Peripheral vascular disease, unspecified: Secondary | ICD-10-CM | POA: Diagnosis not present

## 2022-06-26 DIAGNOSIS — R0600 Dyspnea, unspecified: Secondary | ICD-10-CM

## 2022-06-26 DIAGNOSIS — R0602 Shortness of breath: Secondary | ICD-10-CM

## 2022-06-26 DIAGNOSIS — I255 Ischemic cardiomyopathy: Secondary | ICD-10-CM

## 2022-06-26 NOTE — Patient Instructions (Addendum)
Medication Instructions:  The current medical regimen is effective;  continue present plan and medications as directed. Please refer to the Current Medication list given to you today.   *If you need a refill on your cardiac medications before your next appointment, please call your pharmacy*  Lab Work:    [place]     If you have labs (blood work) drawn today and your tests are completely normal, you will receive your results only by:  1-MyChart Message (if you have MyChart) OR  2-A paper copy in the mail.  If you have any lab test that is abnormal or we need to change your treatment, we will call you to review the results.  Testing/Procedures:  Chest xray - Your physician has requested that you have a chest xray, is a fast and painless imaging test that uses certain electromagnetic waves to create pictures of the structures in and around your chest. This test can help diagnose and monitor conditions such as pneumonia and other lung issues his will be done at 1)  [facility]  Imaging [address], [place]. If you should need to call them their phone number is [phone]    2)  [facility]: [address], [place], [place] [address] [phone]  Special Instructions REFER TO PULMONARY  Follow-Up: Your next appointment:  3-4 month(s) In Person with Quay Burow, MD  or Coletta Memos, FNP        Please call our office 2 months in advance to schedule this appointment   At [facility], you and your health needs are our priority.  As part of our continuing mission to provide you with exceptional heart care, we have created designated Provider Care Teams.  These Care Teams include your primary Cardiologist (physician) and Advanced Practice Providers (APPs -  Physician Assistants and Nurse Practitioners) who all work together to provide you with the care you need, when you need it.  Important Information About Sugar

## 2022-06-27 LAB — BASIC METABOLIC PANEL
BUN/Creatinine Ratio: 16 (ref 10–24)
BUN: 25 mg/dL (ref 8–27)
CO2: 17 mmol/L — ABNORMAL LOW (ref 20–29)
Calcium: 9.2 mg/dL (ref 8.6–10.2)
Chloride: 104 mmol/L (ref 96–106)
Creatinine, Ser: 1.6 mg/dL — ABNORMAL HIGH (ref 0.76–1.27)
Glucose: 127 mg/dL — ABNORMAL HIGH (ref 70–99)
Potassium: 4.7 mmol/L (ref 3.5–5.2)
Sodium: 141 mmol/L (ref 134–144)
eGFR: 43 mL/min/{1.73_m2} — ABNORMAL LOW (ref >59)

## 2022-06-27 LAB — CBC
Hematocrit: 33.7 % — ABNORMAL LOW (ref 37.5–51.0)
Hemoglobin: 11.1 g/dL — ABNORMAL LOW (ref 13.0–17.7)
MCH: 29.4 pg (ref 26.6–33.0)
MCHC: 32.9 g/dL (ref 31.5–35.7)
MCV: 89 fL (ref 79–97)
Platelets: 257 10*3/uL (ref 150–450)
RBC: 3.78 x10E6/uL — ABNORMAL LOW (ref 4.14–5.80)
RDW: 18.4 % — ABNORMAL HIGH (ref 11.6–15.4)
WBC: 8.6 10*3/uL (ref 3.4–10.8)

## 2022-06-28 ENCOUNTER — Other Ambulatory Visit: Payer: Self-pay

## 2022-06-28 DIAGNOSIS — J984 Other disorders of lung: Secondary | ICD-10-CM

## 2022-07-05 ENCOUNTER — Telehealth: Payer: Self-pay | Admitting: Internal Medicine

## 2022-07-06 ENCOUNTER — Ambulatory Visit: Payer: Medicare Other | Admitting: Primary Care

## 2022-07-09 NOTE — Telephone Encounter (Signed)
Office note from 11/2020 printed and faxed to Amy as requested. Nothing further needed

## 2022-07-13 ENCOUNTER — Telehealth: Payer: Self-pay | Admitting: Cardiovascular Disease

## 2022-07-13 NOTE — Telephone Encounter (Signed)
New Message:    Patient passed away on 13-Jul-2022. Patient was getting assistance with is Entresto. Norvatis said our office have to call and cancel the order for this medicine per his wife. Please call [phone] to cancel the [facility].

## 2022-07-13 NOTE — Telephone Encounter (Signed)
Called the wife to verify the information given. The wife stated that the patient passed away peacefully in his recliner while watching his favorite show. Condolences have been given.  [employer] made aware and assistance has been canceled.

## 2022-07-22 DEATH — deceased

## 2022-09-03 ENCOUNTER — Ambulatory Visit: Payer: Medicare Other | Admitting: General Practice
